# Patient Record
Sex: Female | Born: 1970
Health system: Southern US, Community
[De-identification: ages and names within clinical notes are randomized; demographics above are authoritative.]

## PROBLEM LIST (undated history)

## (undated) DIAGNOSIS — E7800 Pure hypercholesterolemia, unspecified: Secondary | ICD-10-CM

## (undated) DIAGNOSIS — E559 Vitamin D deficiency, unspecified: Secondary | ICD-10-CM

## (undated) DIAGNOSIS — I639 Cerebral infarction, unspecified: Secondary | ICD-10-CM

## (undated) DIAGNOSIS — C9590 Leukemia, unspecified not having achieved remission: Secondary | ICD-10-CM

## (undated) DIAGNOSIS — R87619 Unspecified abnormal cytological findings in specimens from cervix uteri: Secondary | ICD-10-CM

## (undated) DIAGNOSIS — I2699 Other pulmonary embolism without acute cor pulmonale: Secondary | ICD-10-CM

## (undated) DIAGNOSIS — G43909 Migraine, unspecified, not intractable, without status migrainosus: Secondary | ICD-10-CM

## (undated) DIAGNOSIS — Z86718 Personal history of other venous thrombosis and embolism: Secondary | ICD-10-CM

## (undated) DIAGNOSIS — M8580 Other specified disorders of bone density and structure, unspecified site: Secondary | ICD-10-CM

## (undated) DIAGNOSIS — N879 Dysplasia of cervix uteri, unspecified: Secondary | ICD-10-CM

## (undated) HISTORY — DX: Vitamin D deficiency, unspecified: E55.9

## (undated) HISTORY — DX: Other specified disorders of bone density and structure, unspecified site: M85.80

## (undated) HISTORY — DX: Unspecified abnormal cytological findings in specimens from cervix uteri: R87.619

## (undated) HISTORY — DX: Pure hypercholesterolemia, unspecified: E78.00

## (undated) HISTORY — DX: Migraine, unspecified, not intractable, without status migrainosus: G43.909

## (undated) HISTORY — DX: Leukemia, unspecified not having achieved remission: C95.90

## (undated) HISTORY — DX: Dysplasia of cervix uteri, unspecified: N87.9

## (undated) HISTORY — DX: Personal history of other venous thrombosis and embolism: Z86.718

## (undated) HISTORY — PX: OTHER SURGICAL HISTORY: SHX169

---
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
## 1995-09-12 HISTORY — PX: DILATION AND CURETTAGE OF UTERUS: SHX78

## 2007-03-28 ENCOUNTER — Ambulatory Visit: Payer: Self-pay | Admitting: Cardiology

## 2007-03-28 ENCOUNTER — Encounter (INDEPENDENT_AMBULATORY_CARE_PROVIDER_SITE_OTHER): Payer: Self-pay | Admitting: Obstetrics and Gynecology

## 2007-03-28 ENCOUNTER — Inpatient Hospital Stay (HOSPITAL_COMMUNITY): Admission: RE | Admit: 2007-03-28 | Discharge: 2007-03-31 | Payer: Self-pay | Admitting: Obstetrics and Gynecology

## 2007-04-02 ENCOUNTER — Encounter (INDEPENDENT_AMBULATORY_CARE_PROVIDER_SITE_OTHER): Payer: Self-pay | Admitting: Obstetrics and Gynecology

## 2007-04-02 ENCOUNTER — Ambulatory Visit: Payer: Self-pay | Admitting: Cardiology

## 2007-04-02 ENCOUNTER — Inpatient Hospital Stay (HOSPITAL_COMMUNITY): Admission: AD | Admit: 2007-04-02 | Discharge: 2007-04-05 | Payer: Self-pay | Admitting: Obstetrics and Gynecology

## 2007-04-07 ENCOUNTER — Inpatient Hospital Stay (HOSPITAL_COMMUNITY): Admission: AD | Admit: 2007-04-07 | Discharge: 2007-04-12 | Payer: Self-pay | Admitting: Obstetrics and Gynecology

## 2007-04-10 ENCOUNTER — Encounter: Admission: RE | Admit: 2007-04-10 | Discharge: 2007-04-12 | Payer: Self-pay | Admitting: Obstetrics and Gynecology

## 2007-04-16 ENCOUNTER — Ambulatory Visit: Payer: Self-pay | Admitting: Cardiology

## 2007-04-22 ENCOUNTER — Ambulatory Visit: Payer: Self-pay | Admitting: Physician Assistant

## 2007-04-29 ENCOUNTER — Ambulatory Visit: Payer: Self-pay | Admitting: Cardiology

## 2007-05-06 ENCOUNTER — Ambulatory Visit: Payer: Self-pay | Admitting: Cardiology

## 2007-05-10 ENCOUNTER — Ambulatory Visit: Payer: Self-pay | Admitting: Cardiology

## 2007-05-14 ENCOUNTER — Ambulatory Visit: Payer: Self-pay | Admitting: Cardiology

## 2007-05-28 ENCOUNTER — Ambulatory Visit: Payer: Self-pay | Admitting: Cardiology

## 2007-06-11 ENCOUNTER — Ambulatory Visit: Payer: Self-pay | Admitting: Cardiology

## 2007-06-20 ENCOUNTER — Ambulatory Visit: Payer: Self-pay | Admitting: Cardiology

## 2007-06-27 ENCOUNTER — Ambulatory Visit: Payer: Self-pay | Admitting: Cardiology

## 2007-07-11 ENCOUNTER — Ambulatory Visit: Payer: Self-pay | Admitting: Cardiology

## 2007-07-18 ENCOUNTER — Ambulatory Visit: Payer: Self-pay | Admitting: Cardiology

## 2007-08-01 ENCOUNTER — Ambulatory Visit: Payer: Self-pay | Admitting: Cardiology

## 2007-08-06 ENCOUNTER — Ambulatory Visit: Payer: Self-pay | Admitting: Cardiology

## 2007-08-14 ENCOUNTER — Ambulatory Visit: Payer: Self-pay | Admitting: Cardiology

## 2007-08-21 ENCOUNTER — Ambulatory Visit: Payer: Self-pay | Admitting: Cardiology

## 2007-09-04 ENCOUNTER — Ambulatory Visit: Payer: Self-pay | Admitting: Cardiology

## 2007-09-24 ENCOUNTER — Ambulatory Visit: Payer: Self-pay | Admitting: Cardiovascular Disease

## 2007-09-24 ENCOUNTER — Ambulatory Visit: Payer: Self-pay | Admitting: Cardiology

## 2008-03-31 ENCOUNTER — Ambulatory Visit: Payer: Self-pay | Admitting: Cardiology

## 2008-06-10 IMAGING — CT CT ABDOMEN WO/W CM
3 of 6 series · 13 of 32 positions shown, 17 images · IV contrast (30ML OMNI-MIX & [ID] OMNIP 300%)
Comparison: None
COMPARISON: None

CLINICAL DATA: Excessive bleeding. Evaluate for retroperitoneal hematoma.

ABDOMEN CT WITHOUT AND WITH CONTRAST
TECHNIQUE: Multidetector CT imaging of the abdomen was performed following the
standard protocol before and during bolus administration of intravenous
contrast.
Contrast:  100 cc Omnipaque 300
TECHNIQUE: Multidetector CT imaging of the pelvis was performed following the
standard protocol during administration of intravenous contrast.

[Series 3: abd/pelvis w cm · axial · 0.70mm/px · z∈[-400,-175]mm · 3 of 84 slices shown, 7 images]
[im 21/84  soft-tissue]
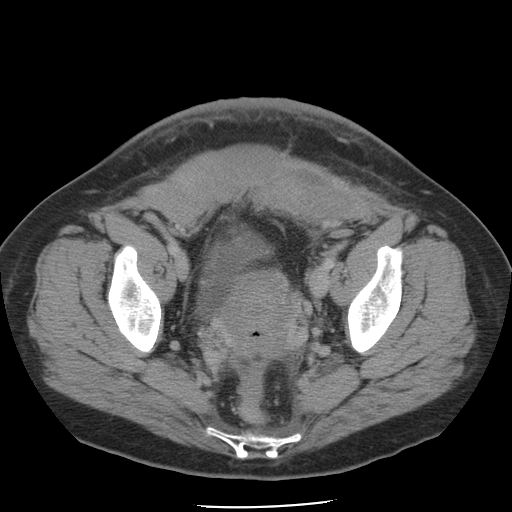
[im 21/84  lung]
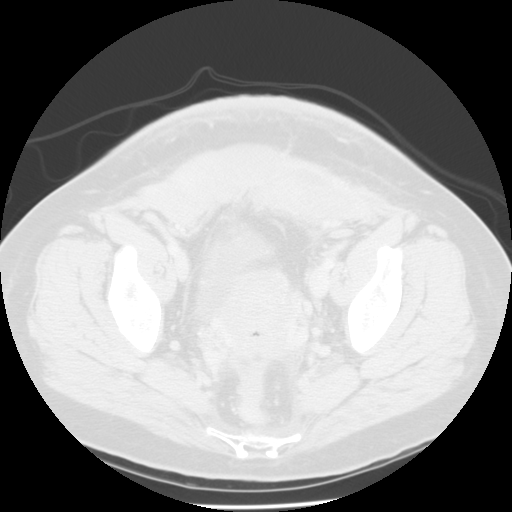
[im 21/84  bone]
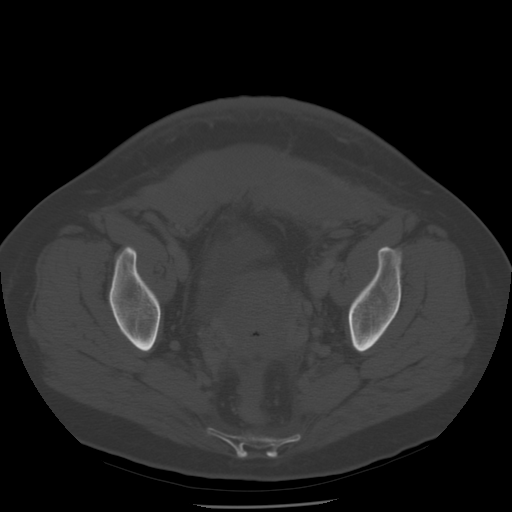
[im 42/84  soft-tissue]
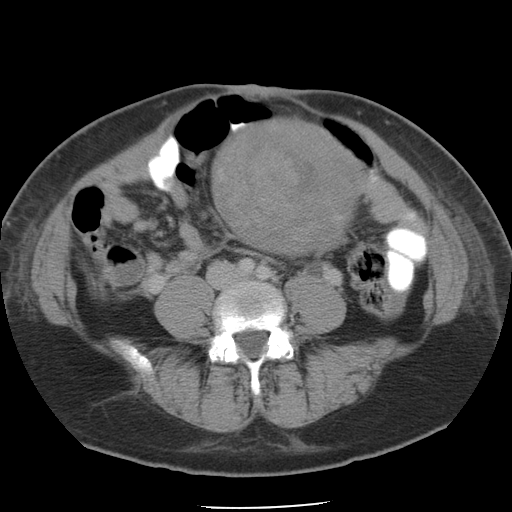
[im 42/84  lung]
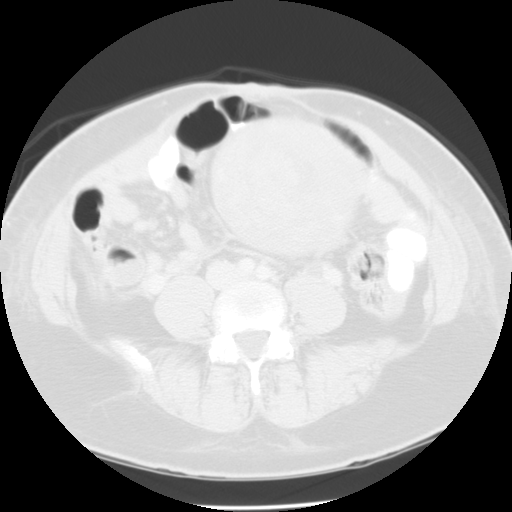
[im 63/84  soft-tissue]
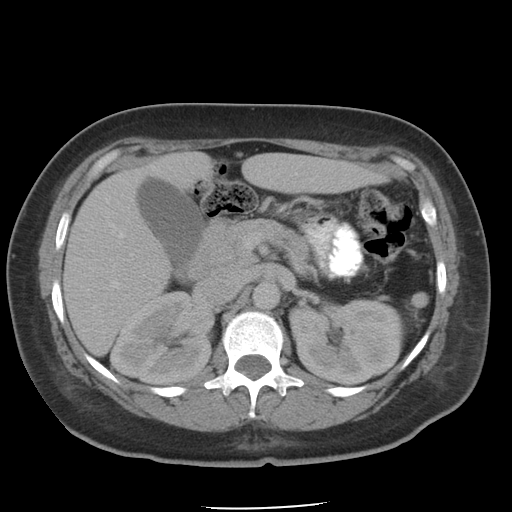
[im 63/84  lung]
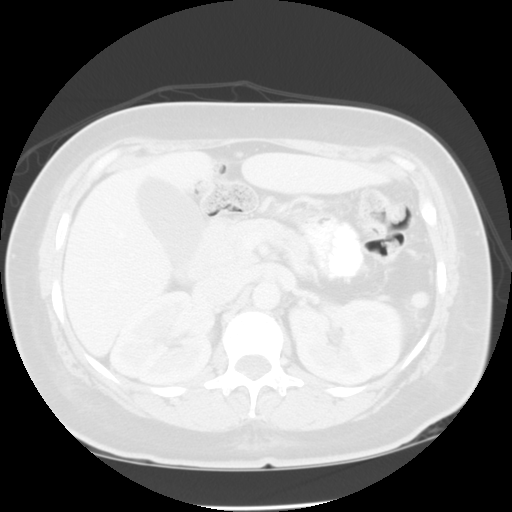

[Series 6: delays · axial · 0.70mm/px · z∈[-345,-230]mm · 2 of 69 slices shown]
[im 23/69  soft-tissue]
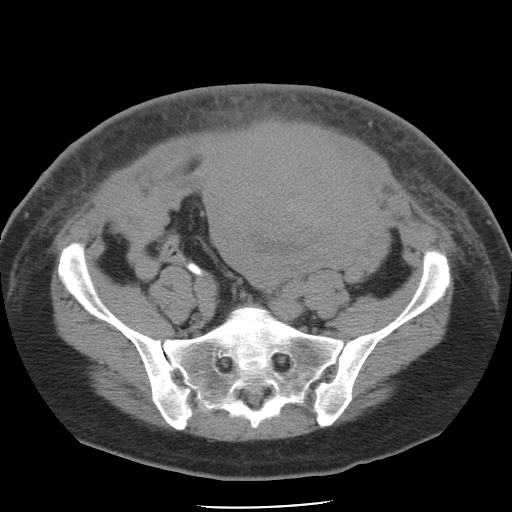
[im 46/69  soft-tissue]
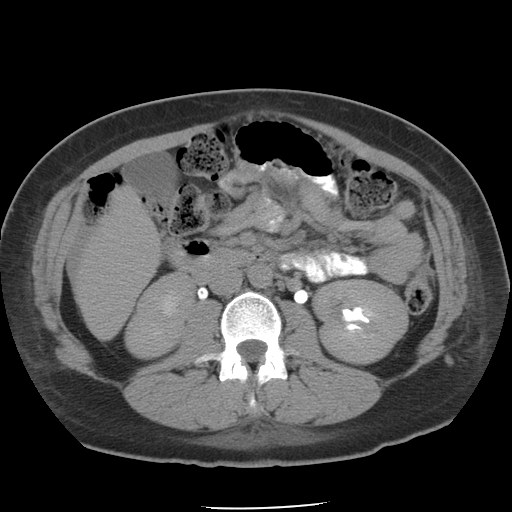

[Series 501: reformatted · sagittal · 0.84mm/px · 8 of 175 slices shown]
[im 20/175  soft-tissue]
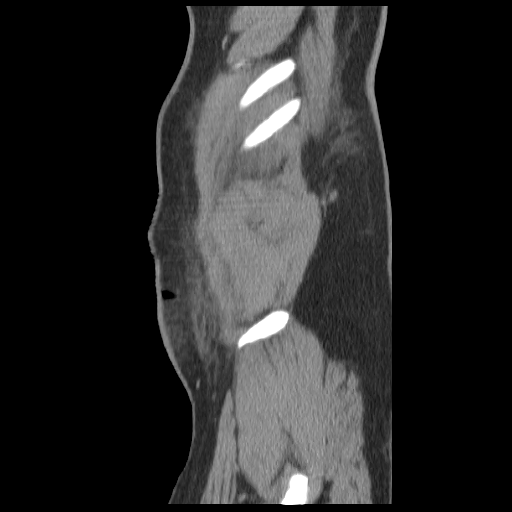
[im 39/175  soft-tissue]
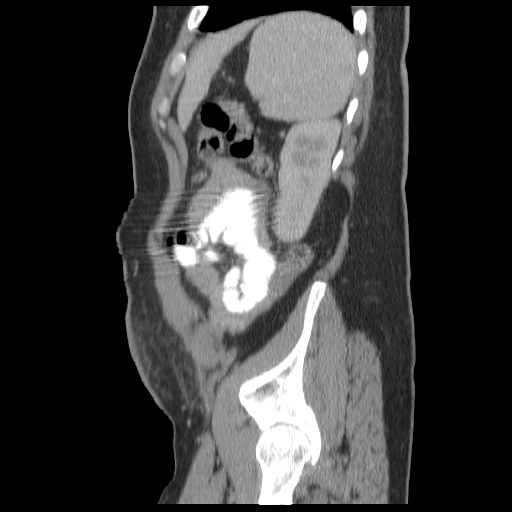
[im 59/175  soft-tissue]
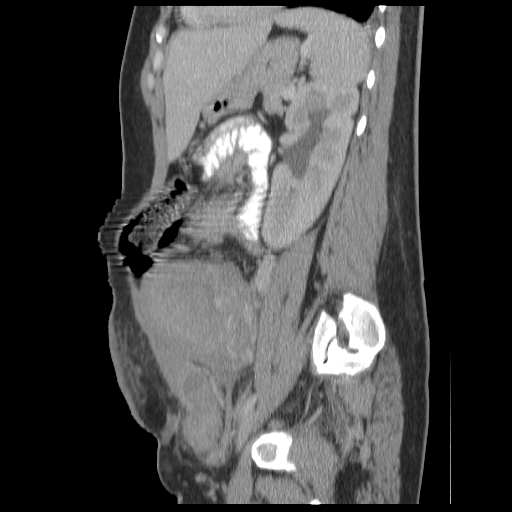
[im 78/175  soft-tissue]
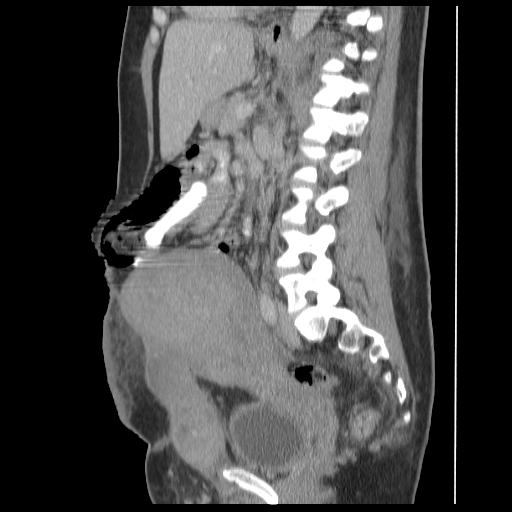
[im 97/175  soft-tissue]
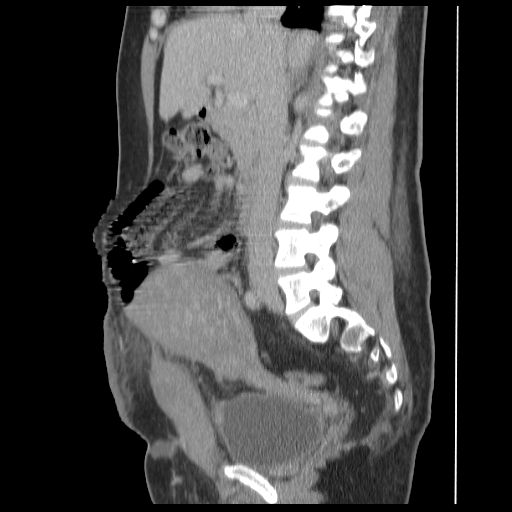
[im 117/175  soft-tissue]
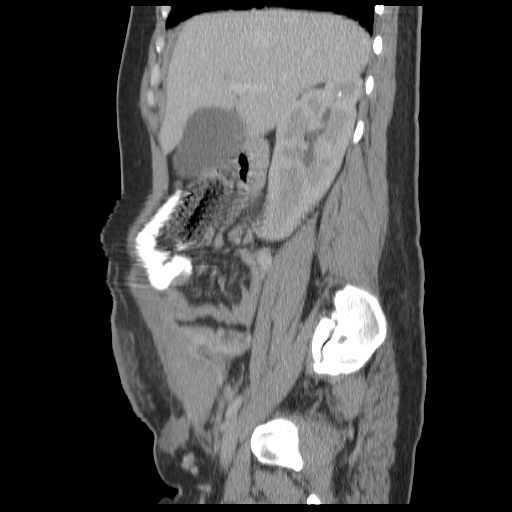
[im 136/175  soft-tissue]
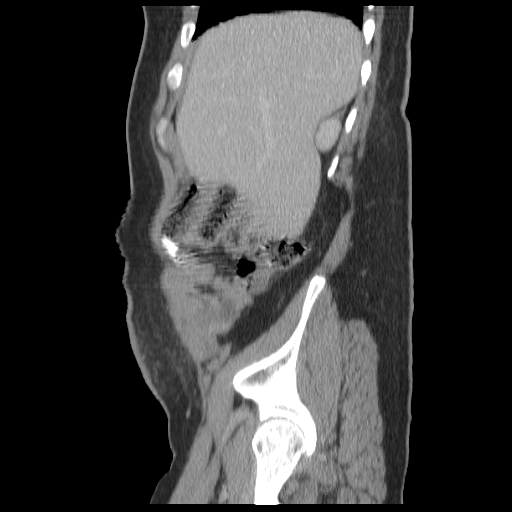
[im 155/175  soft-tissue]
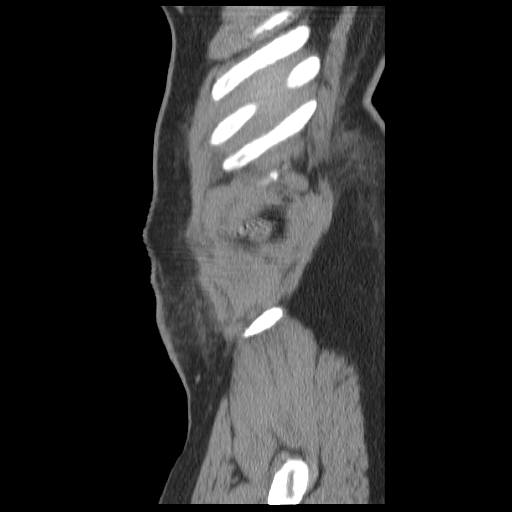

[13 of 32 positions shown; findings below may reference images not displayed]

FINDINGS: Mild subsegmental atelectasis is identified at the left base. 

There is a small amount of perihepatic fluid is noted

The liver is normal in attenuation and morphology.

Spleen negative.

Adrenal glands are negative.

Small height hernia.

There is mild to moderate bilateral hydronephrosis. 

On the delayed images there is prompt and symmetric excretion of contrast
material.

No evidence for a retroperitoneal hematoma.

Review of bone windows is unremarkable.

IMPRESSION

1. No evidence for retroperitoneal hematoma in the upper abdomen.

2. Bilateral hydronephrosis which is likely there are related to physiology of
pregnancy.

PELVIS CT WITH CONTRAST
FINDINGS: The uterus remains enlarged and distended. There is a hyperdense mass
within the uterine cavity measuring 6.8 x 7.0 cm.

The lower anterior abdominal wall/upper pelvic wall is abnormal in appearance.
The lower rectus muscle is abnormally thickened with several cystic masses
containing intermediate to high density material. The more cranial rectus mass
measures 3.1 x 5.7 cm, image 64. This contains layering high density material
which is suspicious for subacute hemorrhage. 

Within the left lower rectus muscle there is a 5.1 x 4.3 cm high density, cystic
mass.

Trace free fluid is seen within the pelvis.

Urinary bladder is negative.

Pelvic bowel loops unremarkable.

IMPRESSION

1.  Hyperdense mass within uterine cavity likely represents hematoma versus
retained products of conception.

2.  Multiple intermediate to high density fluid collections within the rectus
muscle likely represent hematomas.

3.  Examination was discussed with Dr. Zenaida Eisenhauer.

## 2009-01-25 ENCOUNTER — Encounter (INDEPENDENT_AMBULATORY_CARE_PROVIDER_SITE_OTHER): Payer: Self-pay | Admitting: *Deleted

## 2009-04-03 DIAGNOSIS — Z86718 Personal history of other venous thrombosis and embolism: Secondary | ICD-10-CM

## 2009-04-14 ENCOUNTER — Ambulatory Visit: Payer: Self-pay | Admitting: Cardiology

## 2009-04-14 DIAGNOSIS — E783 Hyperchylomicronemia: Secondary | ICD-10-CM | POA: Insufficient documentation

## 2010-08-30 ENCOUNTER — Ambulatory Visit (HOSPITAL_COMMUNITY)
Admission: RE | Admit: 2010-08-30 | Discharge: 2010-08-30 | Payer: Self-pay | Source: Home / Self Care | Attending: Internal Medicine | Admitting: Internal Medicine

## 2010-09-20 ENCOUNTER — Ambulatory Visit
Admission: RE | Admit: 2010-09-20 | Discharge: 2010-09-20 | Payer: Self-pay | Source: Home / Self Care | Attending: Cardiology | Admitting: Cardiology

## 2010-09-20 ENCOUNTER — Encounter: Payer: Self-pay | Admitting: Cardiology

## 2010-10-13 NOTE — Assessment & Plan Note (Signed)
Summary: f1y   Visit Type:  1 yr f/u Primary Provider:  Dr. Sherril Croon  CC:  pt states she had a scan on the right leg for some pain says thinks this was due to sciatica....denies any other complaints today.  History of Present Illness: Rolando times a day for evaluation and management for a post partum pulmonary embolus. Her youngest child is now 41 years old and doing well.  She's had no chest pain, dyspnea on exertion or syncope. She did have some pain in her back down her right thigh. He was treated as sciatica. She did have a venous Doppler which was negative for clot.  Her last cholesterol was 176 and ratio was up to 39. She is being more careful with her diet. She tries to walk as much as possible.  Current Medications (verified): 1)  Zyrtec Allergy 10 Mg Tabs (Cetirizine Hcl) .Marland Kitchen.. 1 Tab Once Daily 2)  Iud .... Use As Directed 3)  Ibuprofen 200 Mg Tabs (Ibuprofen) .... As Needed 4)  Zantac 150 Mg Tabs (Ranitidine Hcl) .... As Needed 5)  Aspirin 81 Mg Tbec (Aspirin) .... 2 Tabs Once Daily 6)  Fish Oil 1200 Mg Caps (Omega-3 Fatty Acids) .... As Needed  Allergies: 1)  ! Sulfa  Past History:  Past Medical History: Last updated: 04/03/2009 PULMONARY EMBOLISM, HX OF (ICD-V12.51)    Past Surgical History: Last updated: 04/03/2009 Wisdom teeth extracted C-section x2..2002 and 2008 D&C.Marland Kitchen1997  Family History: Last updated: 04/03/2009 Family History of Coronary Artery Disease: Father and grandmother  Social History: Last updated: 04/03/2009 Full Time..RN..orthopedic floor Married  Tobacco Use - No.  Alcohol Use - no Regular Exercise - yes..walk Drug Use - no  Risk Factors: Exercise: yes (04/03/2009)  Risk Factors: Smoking Status: never (04/03/2009)  Review of Systems       negative other history of present illness  Vital Signs:  Patient profile:   40 year old female Height:      64.5 inches Weight:      160.25 pounds BMI:     27.18 Pulse rate:   57 /  minute Pulse rhythm:   irregular BP sitting:   96 / 72  (left arm) Cuff size:   large  Vitals Entered By: Danielle Rankin, CMA (September 20, 2010 11:43 AM)  Physical Exam  General:  overweight, in no acute distress Head:  normocephalic and atraumatic Eyes:  PERRLA/EOM intact; conjunctiva and lids normal. Neck:  Neck supple, no JVD. No masses, thyromegaly or abnormal cervical nodes. Chest Nyoka Alcoser:  no deformities or breast masses noted Lungs:  Clear bilaterally to auscultation and percussion. Heart:  PMI nondisplaced, regular rate and rhythm, normal S1-S2, no murmur rub or gallop, no carotid bruit Abdomen:  soft, no midline bruit good bowel sounds Msk:  Back normal, normal gait. Muscle strength and tone normal. Pulses:  pulses normal in all 4 extremities Extremities:  No clubbing or cyanosis.no varicose veins or signs of DVT Neurologic:  Alert and oriented x 3. Skin:  Intact without lesions or rashes. Psych:  Normal affect.   Impression & Recommendations:  Problem # 1:  PULMONARY EMBOLISM, HX OF (ICD-V12.51) Assessment Improved  Her updated medication list for this problem includes:    Aspirin 81 Mg Tbec (Aspirin) .Marland Kitchen... 2 tabs once daily  Problem # 2:  HYPERLIPIDEMIA TYPE I / IV (ICD-272.3) Assessment: Improved  Other Orders: EKG w/ Interpretation (93000)  Patient Instructions: 1)  Your physician recommends that you schedule a follow-up appointment in: 2 years  and  as needed with Dr. Daleen Squibb 2)  Your physician recommends that you continue on your current medications as directed. Please refer to the Current Medication list given to you today.

## 2010-12-08 ENCOUNTER — Other Ambulatory Visit: Payer: Self-pay | Admitting: Obstetrics and Gynecology

## 2011-01-18 ENCOUNTER — Other Ambulatory Visit: Payer: Self-pay | Admitting: Obstetrics and Gynecology

## 2011-01-24 NOTE — Assessment & Plan Note (Signed)
[facility]                            CARDIOLOGY OFFICE NOTE   NICCOLE, WITTHUHN                    MRN:          [id number]  DATE:09/24/2007                            DOB:          February 19, 1971    Judy Walter comes back today for follow-up of her CT to rule out any further  problems with her pulmonary embolus.   She is now about 6 months out from her acute pulmonary embolus as noted  in the previous records.   A CT scan in our office today showed resolution of the right sided  pulmonary embolus without any evidence of acute cardiopulmonary disease.  She has a small hiatal hernia.   She is totally asymptomatic.  She is delighted to be coming off of  Coumadin which I think is safe at this point in time.   She is very interested in losing weight and I have spent about 20  minutes talking about diet, high glycemic indexes which she will look up  on the Internet and walking at least 3 hours per week.   Her blood pressure is excellent at 125/85.  Her pulse is 64 and regular,  weight is 162.  LUNGS:  Clear without rub.  HEART:  Reveals a regular rate and rhythm.  No gallop.  Carotids are  full without bruits.  There is no JVD.  EXTREMITIES:  No edema.  Pulses are intact.   I will see [name] back in 6 months.  Our goal is lose about 20  pounds and keep it off during that period of time.     Thomas C. Daleen Squibb, MD, [facility]  Electronically Signed    TCW/MedQ  DD: 09/24/2007  DT: 09/25/2007  Job #: [id number]   cc:   Doreen Beam, MD  Randye Lobo, M.D.

## 2011-01-24 NOTE — Op Note (Signed)
NAME:  Judy Walter, Judy Walter             ACCOUNT NO.:  [id number]   MEDICAL RECORD NO.:  [id number]          PATIENT TYPE:  INP   LOCATION:  NA                            [facility]ILITY:  [facility]   PHYSICIAN:  Randye Lobo, M.D.   DATE OF BIRTH:  1971/05/30   DATE OF PROCEDURE:  03/28/2007  DATE OF DISCHARGE:                               OPERATIVE REPORT   PREOPERATIVE DIAGNOSIS:  1. Intrauterine gestation at 38.3 weeks.  2. History of prior cesarean section, desires repeat cesarean section.  3. History of vasaprevia result by ultrasound.   POSTOPERATIVE DIAGNOSIS:  1. Intrauterine gestation at 38.3 weeks.  2. History of prior cesarean section, desires repeat cesarean section.  3. Vasaprevia present.   PROCEDURE:  Repeat low segment transverse cesarean section.   SURGEON:  Conley Simmonds, M.D.   ASSISTANT:  Lodema Hong, M.D.   ANESTHESIA:  Is spinal.   IV FLUIDS:  3000 mL Ringer's lactate.   URINE OUTPUT:  300 mL   ESTIMATED BLOOD LOSS:  700 mL   COMPLICATIONS:  None.   INDICATIONS FOR PROCEDURE:  The patient is a 40 year old a gravida 4,  para 2-0-1-2 Caucasian female at 11 plus [redacted] weeks gestation ([facility] April 08, 2007 by 8-week ultrasound) who has a history of prior cesarean section  for breech presentation and throughout this pregnancy desires a repeat  cesarean section.  Early in the pregnancy the patient was diagnosed with  a vasaprevia which has resolved by follow-up ultrasounds by [facility].  The patient does have a history of fetal macrosomia.  A  plan is now made to proceed with a repeat low segment transverse  cesarean section after risks, benefits, and alternatives are reviewed.   FINDINGS:  A viable female was delivered at 8:01 a.m. with Apgars of 9  at one minute and 9 at one minutes.  The weight was 9 pounds 9 ounces.  The amniotic fluid was clear.  The placenta did appear to have a  vasaprevia present was a marginal insertion of a three-vessel cord.  The  uterus, tubes and ovaries were unremarkable.   SPECIMENS:  The placenta was sent to pathology.   PROCEDURE:  The patient was reidentified in the preoperative hold area.  She did receive Ancef 1 gram IV for antibiotic prophylaxis.   In the operating room, spinal anesthetic was administered and the  patient was then placed in the supine position with a left lateral tilt.  The abdomen was sterilely prepped and a Foley catheter placed inside the  bladder.  She was sterilely draped.   An Allis test was performed and the patient had adequate surgical  anesthesia.  A Pfannenstiel incision was created sharply with a scalpel.  Incision was carried down to the fascia using a combination of sharp  dissection and monopolar cautery for hemostasis.   The fascia was incised in the midline and the fascial incision was  extended bilaterally with the Mayo scissors.  The rectus muscles were  dissected off of the fascia superiorly and inferiorly.  The rectus  muscles were then sharply divided in the midline.  Two hemostat clamps  were used to elevate the parietal peritoneum which was entered sharply.  The incision was extended cranially and caudally.   A bladder flap was created and the bladder retractor was placed over the  bladder.  A transverse lower uterine segment incision was then created  sharply with a scalpel.  Membranes were ruptured with an Allis clamp.  The uterine incision was extended bilaterally in an upward fashion  bluntly.  A hand was inserted through the uterine incision.  The vertex  was delivered without difficulty.  The nares and mouth were suctioned.  A nuchal cord was reduced.  The remainder of the newborn was delivered.  The cord was doubly clamped and cut and the newborn was carried over to  the waiting pediatricians in vigorous condition.   The placenta was manually extracted and the vasa previa was noted and  the placenta was sent to pathology.  The uterus was exteriorized  at this  time and it was wiped clean with a moistened lap pad.  There were no  remaining products of conception noted.  The uterine incision was closed  with a double layer closure of #1 chromic.  The first was a running  locked layer and the second was an imbricating layer.  There was a small  amount of bleeding noted along the right apex of the uterine incision  and this responded to a figure-of-eight suture of #1 chromic.   The uterus was returned to the peritoneal cavity at this time which was  irrigated and suctioned.  The uterine incision was hemostatic.  The  abdomen was therefore closed.  The parietal peritoneum was closed with a  running suture of 3-0 Vicryl.  The rectus muscles were approximated with  interrupted sutures of 0 Vicryl.  The fascia was closed with a running  suture of 0 Vicryl.  The subcutaneous tissue was irrigated and suctioned  and made hemostatic with monopolar cautery.  The skin was closed with  staples and a sterile bandage was placed over this.   This concluded the patient's procedure.  There were no complications.  All needle, instrument, sponge counts were correct.      Randye Lobo, M.D.  Electronically Signed     BES/MEDQ  D:  03/28/2007  T:  03/28/2007  Job:  [id number]

## 2011-01-24 NOTE — Assessment & Plan Note (Signed)
[facility]                            CARDIOLOGY OFFICE NOTE   Judy Walter, Judy Walter                    MRN:          [id number]  DATE:08/06/2007                            DOB:          1971/05/06    HISTORY:  Judy Walter returns today for further management of her  pulmonary embolus.  This was a post-partum pulmonary embolus diagnosed  April 01, 2007.  She has finally achieved therapeutic INR with very  significant doses of Coumadin.  She takes 12.5 on Mondays, 10 on Tuesday  through Sunday.   She is having no chest pain, shortness of breath, hemoptysis, tachy  palpitations.  She has had no lower extremity edema.  She brings her  beautiful child today.   MEDICATIONS:  1. She is on iron sulfate.  2. Zyrtec 10 mg a day.  3. Zantac 75 mg p.o. daily.  4. Fish oil.  5. Omega 3.  6. Prenatal Elite vitamins.   PHYSICAL EXAMINATION:  VITAL SIGNS:  Blood pressure 114/89, pulse 59 and  regular, weight 163.  HEENT:  Normocephalic, atraumatic.  PERRLA.  Extraocular movements are  intact.  Sclerae are clear.  Face asymmetry is normal.  NECK:  Carotid upstrokes are equal bilaterally without bruits.  No JVD.  Thyroid is not enlarged.  Trachea is midline.  LUNGS:  Clear.  HEART:  Reveals a regular rate and rhythm.  Normal S1 and S2.  ABDOMEN:  Soft, good bowel sounds.  EXTREMITIES:  No clubbing, cyanosis, or edema.  There is no sign of DVT.   DIAGNOSTICS:  EKG shows sinus brady with slight rightward axis which is  stable.  She has some  ST segment flattening inferiorly.   ASSESSMENT/PLAN:  Judy Walter is doing well.  We will set up a spiral  CT with contrast in our office at the end of January.  If this is  negative for any pulmonary emboli, we will discontinue Coumadin at that  time.     Thomas C. Daleen Squibb, MD, [facility]  Electronically Signed    TCW/MedQ  DD: 08/06/2007  DT: 08/06/2007  Job #: [id number]   cc:   Randye Lobo, M.D.  Dhruv Sherril Croon

## 2011-01-24 NOTE — Assessment & Plan Note (Signed)
[facility]                            CARDIOLOGY OFFICE NOTE   VERBA, AINLEY                    MRN:          [id number]  DATE:05/10/2007                            DOB:          1971/03/05    [name] returns today for follow-up and management of her  pulmonary embolus.   After delivery via C-section she became bradycardic and short of breath.  She was found to have pulmonary emboli by CT scan.  She was  anticoagulated and ultimately discharged home.  Unfortunately, she  developed some bleeding subcutaneously and developed a significant intra-  abdominal wall hematoma.   She had to be readmitted to watch carefully.   She is having her INRs checked in [place].  She says it is taking a lot of  Coumadin to keep her anticoagulated.  Her last one was 1.8 and they  bumped her up.  She is currently taking 12.5 mg on Monday, 10 mg the  rest of the week.   Her other medications are prenatal vitamin, fish oil, omega-3, Zantac 75  mg a day, Zyrtec 10 mg a day, ferrous sulfate 325 mg a day.   She denies any shortness of breath, cough, hemoptysis, fever, chills,  sweats, melena or hematochezia.   Her blood pressure is 110/80, her pulse 53 and regular.  Her weight is  163.  She is in no acute distress.  Skin is warm and dry.  HEENT:  Unremarkable.  Carotid upstrokes are equal bilaterally without bruits.  No JVD.  Thyroid is not enlarged.  Trachea is midline.  LUNGS:  Clear without rubs or decreased breath sounds.  HEART:  A normal S1-S2 without gallop.  ABDOMEN:  Soft with good bowel sounds.  Her hematoma is largely  resolved.  There is no tenderness.  EXTREMITIES:  No edema.  Pulses are intact.  There is no sign of DVT.  NEUROLOGIC:  Intact.   2 D echocardiogram in the hospital was normal.   She asked me today if she can take the mini-pill, which is progesterone  only, for contraception.  She is also curious about IUDs, which she says  she thinks has a touch of estrogen.   I discussed it with Bevelyn Buckles. Bensimhon, MD, and Dr. Jimmey Ralph.  We feel  like the progesterone-only as a mini pill is reasonable.  I have asked  her to ask Dr. Edward Jolly about how much estrogen is in the IUDs and are they  systemically absorbed.  If they are, we would not recommend this.   I will see her back in 3 months.  She will continue to have her Coumadin  checked in [place].  We will probably continue her Coumadin for a total of  6 months, re-scan her, and if everything is clear probably discontinue  Coumadin at that time.     Thomas C. Daleen Squibb, MD, [facility]  Electronically Signed    TCW/MedQ  DD: 05/10/2007  DT: 05/12/2007  Job #: [id number]   cc:   Randye Lobo, M.D.  Dhruv Sherril Croon

## 2011-01-24 NOTE — Assessment & Plan Note (Signed)
[facility]                                 ON-CALL NOTE   AARIN, SPARKMAN                      MRN:          [id number]  DATE:04/06/2007                            DOB:          November 16, 1970    CARDIOLOGIST:  Jesse Sans. Wall, MD, [facility].   PHONE NUMBER:  [phone].   HISTORY:  [name] is a 40 year old female post-partum patient who  recently delivered via C-section and reported to the hospital with  bradycardia and shortness of breath.  She was found to have pulmonary  emboli and was placed on Lovenox and Coumadin.  She was discharged to  home today.  She is a Engineer, civil (consulting) at [employer].  She checked her heart rate  with her stethoscope earlier today and noted that her heart rate was 43.  She denied any symptoms of lightheadedness, syncope, near-syncope or  increasing shortness of breath.   PLAN:  I explained to Ms. Cordone that her heart rate of 43 is  probably okay.  She seems to be fairly asymptomatic.  If she develops  any symptoms or changes in her breathing, like she did when she  presented with her pulmonary emboli, she should go to the emergency  room.  Otherwise, I will leave a message at the office for Dr. Daleen Squibb or  his nurse.  I told the patient we could consider a holter monitor.  But,  I will leave this up to Dr. Daleen Squibb who knows the patient well.   DISPOSITION:  As noted above.      Tereso Newcomer, PA-C  Electronically Signed      Noralyn Pick. Eden Emms, MD, [facility]  Electronically Signed   SW/MedQ  DD: 04/06/2007  DT: 04/07/2007  Job #: [id number]

## 2011-01-24 NOTE — Discharge Summary (Signed)
NAME:  Judy Walter, Judy Walter             ACCOUNT NO.:  [id number]   MEDICAL RECORD NO.:  [id number]          PATIENT TYPE:  INP   LOCATION:  9306                          FACILITY:  [facility]   PHYSICIAN:  Randye Lobo, M.D.   DATE OF BIRTH:  09/24/1970   DATE OF ADMISSION:  04/07/2007  DATE OF DISCHARGE:  04/12/2007                               DISCHARGE SUMMARY   FINAL DIAGNOSIS:  Bilateral pulmonary emboli, abdominal wall hematomas,  anemia.   COMPLICATIONS:  None.   This 40 year old G4, P3-0-1-3 presents on postoperative day number 11  from a Cesarean Section, complaining of painless vaginal bleeding.  The  patient was also be readmitted earlier on the 22nd with some shortness  of breath and sinus bradycardia.  The patient was diagnosed with right  pulmonary emboli.  The patient was on IV heparin on conversion to  Lovenox and Coumadin.  She was discharged on the 25th. The patient's  antepartum course had been complicated by a vasa previa of the placenta.  The patient also had a history of childhood leukemia, but no other  complications during her antepartum course were noted.   Upon admission, the patient's uterus was mildly tender.  Her incision  looked nice and clean and dry.  She was having some vaginal bleeding  with clots removed.  Her uterus was about 16 weeks' size and tender.  An  ultrasound was performed which did show a 9-cm anterior pelvic hematoma.  The patient was admitted.  Labs were obtained.  The patient's coumadin  was in a subtherapeutic range, however, it was felt that her Lovenox  dosage was above the desired therapeutic dosage, and this was attributed  due to rapid changes in her body mass index following delivery.  Lovenox  was discontinued, and IV heparin was begun while waiting for the  coumadin to reach a therapeutic level.  The patient's vaginal bleeding  was stable during this hospital course.  Retained products of conception  was low on the differential  diagnosis, and therefore a dilation and  curettage was not performed, watching hemoglobin at this time. A CT scan  of the abdomen and pelvis on the 28th did show bilateral hydronephrosis  and no evidence of intraperitoneal hematoma, but there were several  small abdominal wall hematomas consistent with subacute hemorrhage.  The  patient was started on IV cefotetan for prophylaxis during this time.  Cardiology continued to see the patient throughout this hospital course.  The patient's INRs and coag panels were kept close watch on.  The  patient's bleeding started to minimize, and her hemoglobin started to  steady off.  She was noted to have a macrocytic anemia, so B12 and  folate were checked as well.  The patient was feeling much better.  Hemoglobin was stable.  The patient was on heparin and Coumadin at this  time.  By August 1, the patient was doing well.  The baby was being  discharged that day, and she was ready to go home as well.  She was  stable.  She was to continue her Coumadin per cardiology, was sent  home  with some Lortab 5/500, one to two every 4 to 6 hours as needed for the  pain.  She was to avoid any ibuprofen products.  She had a follow-up  appointment with her cardiologist on August 5th and was to follow up in  our office in 2 weeks for an ultrasound and post-op check.  She was of  course to call with any increased bleeding, pain or fevers.   LABS ON DISCHARGE:  The patient had a hemoglobin that was stable at 9.5.  This was down from a admission level of 12.6.  She also had a white  blood cell count of 8.5, platelets of 361,000, a PT of 24.4 and an INR  of 2.1 on discharge.      Leilani Able, P.A.-C.      Randye Lobo, M.D.  Electronically Signed    MB/MEDQ  D:  05/13/2007  T:  05/13/2007  Job:  [id number]

## 2011-01-24 NOTE — Assessment & Plan Note (Signed)
[facility]                            CARDIOLOGY OFFICE NOTE   Judy, Walter                    MRN:          [id number]  DATE:03/31/2008                            DOB:          [birth year]    Judy Walter comes in today for further management and followup of her history  of pulmonary embolus.  Please see my note from September 24, 2007.   She is having no shortness of breath and no chest pains.  Last CT showed  resolution of her right-sided pulmonary embolus.   She has lost 11 pounds of the 20 we had talked about on last visit.  Her  blood pressure is down to 98/62.  Her heart rate is 58 and regular.  She  said she feels well.  Her goal was to lose about 10 more.   She shared a lipid panel that she had with me verbally.  Her total  cholesterol was about 170.  She said that HDLs are in the 20s and LDLs  over 100, much of this is genetic.   CURRENT MEDS:  1. Zyrtec 10 mg a day.  2. Birth control pill daily.  3. Multivitamin daily.   PHYSICAL EXAMINATION:  GENERAL:  She is in no acute distress.  Weight is  down to 151.  VITALS:  98/62, pulse is 58 and regular.  HEENT:  Unchanged.  Carotid upstrokes were equal bilaterally without  bruits.  No JVD.  Thyroid is not enlarged.  Trachea is midline.  LUNGS:  Clear.  HEART:  Reveals a nondisplaced PMI.  Normal S1 and S2.  ABDOMEN:  Soft.  Good bowel sounds.  EXTREMITIES:  No edema.  Pulses are intact.  NEURO:  Intact.   [name] is doing well and I am very proud of her losing the weight and I  have encouraged her to lose hopefully 5-10 more.  I have explained to  her that her total cholesterol and LDL may drop further with diet, but  her HDL probably will not change a lot.   At this point, I have made no changes in her program.  We will plan on  seeing her back in a year.    Thomas C. Daleen Squibb, MD, [facility]  Electronically Signed   TCW/MedQ  DD: 03/31/2008  DT: 04/01/2008  Job #: [id number]   cc:   Randye Lobo, M.D.

## 2011-01-24 NOTE — Discharge Summary (Signed)
Judy Walter, Judy Walter             ACCOUNT NO.:  [id number]   MEDICAL RECORD NO.:  [id number]          PATIENT TYPE:  INP   LOCATION:  9128                          FACILITY:  [facility]   PHYSICIAN:  Carrington Clamp, M.D. DATE OF BIRTH:  10-15-1970   DATE OF ADMISSION:  03/28/2007  DATE OF DISCHARGE:  03/31/2007                               DISCHARGE SUMMARY   FINAL DIAGNOSES:  Intrauterine pregnancy at 38-3/7 weeks' gestation,  history of prior cesarean section.  The patient desires repeat cesarean  section, history of vaso previa  present.   PROCEDURE:  Repeat low transverse cesarean section.  Surgeon Dr. Conley Simmonds, assistant Dr. Lodema Hong.   COMPLICATIONS:  None.   This 40 year old G4, P2-0-1-2 presents at 38-3/7 weeks' gestation for a  repeat cesarean section.  The patient had a history of prior cesarean  section with her first pregnancy secondary to breech presentation and  desires repeat during this pregnancy as well.  Early in the patient's  pregnancy she was also diagnosed with vaso previa which has been  followed up by multiple ultrasounds at [facility].  Otherwise, the  patient's antepartum course up to this point is complicated by advanced  maternal age.  She did have a normal first trimester screen and AST and  declined amniocentesis.  The patient also had a history of childhood  leukemia.  At this point she is taken to the operating room.   HOSPITAL COURSE:  At this point the patient is taken to the operating  room on March 28, 2007 by Dr. Conley Simmonds where a repeat low segment  transverse cesarean section was performed with the delivery of a 9 pound  9 ounce female infant with Apgars of 9 and 9.  Placenta did appear to  have vaso previa present with a marginal insertion of a three-vessel  cord. Delivery went without complications.  The patient's postoperative  course was benign without any significant fevers.  She did have some  mild postoperative anemia and was  started on iron.  Was felt ready for  discharge on postoperative day #3.  She was sent home on a regular diet,  told to decrease her activity, told to continue her prenatal vitamins  and her iron supplement.  Was given Percocet 1-2 every 4-6 hours as  needed for pain.  Was to follow up in our office in four weeks.  The  patient was also told she could use Motrin up to 600 mg every six hours  as needed for pain.  Precautions and instructions were reviewed with the  patient, and she was told to follow up in our office on August 15 for a  postpartum check.   LABORATORIES ON DISCHARGE:  She had a hemoglobin 9.7 which is down from  a preoperative level of 11.4.  She had white blood cell count of 10.1  and platelets of 185,000.     Leilani Able, P.A.-C.      Carrington Clamp, M.D.  Electronically Signed   MB/MEDQ  D:  05/10/2007  T:  05/11/2007  Job:  [id number]

## 2011-01-24 NOTE — Discharge Summary (Signed)
NAME:  Judy Walter, Judy Walter             ACCOUNT NO.:  [id number]   MEDICAL RECORD NO.:  [id number]          PATIENT TYPE:  INP   LOCATION:  9374                          [facility]ILITY:  [facility]   PHYSICIAN:  Randye Lobo, M.D.   DATE OF BIRTH:  1970-10-22   DATE OF ADMISSION:  04/02/2007  DATE OF DISCHARGE:  04/05/2007                               DISCHARGE SUMMARY   FINAL DIAGNOSES:  1. Status post cesarean section, bilateral tubal ligation.      postoperative day #5.  2. Shortness of breath.  3. Pulmonary embolism.   CONSULTATIONS OBTAINED:  Cardiology.   COMPLICATIONS:  None.   This 40 year old female who was status post cesarean section and tubal  ligation 5 days prior presents with history of shortness of breath and  pressure in her chest.  The patient's postoperative course to this point  had been uneventful.  In the ER upon admission, the patient was noted to  be bradycardic and have some hypotension as well.  She was having 2-3+  edema in her lower extremities to her knees.  No evidence of DVT was  noted.  The patient was admitted.  An EKG did confirm bradycardia, and  cardiac consult was obtained immediately.  Labs for preeclampsia were  benign.  She did have slightly elevated liver function tests, but no  other abnormalities on her labs.  Cardiology saw the patient on April 02, 2007 and ordered a CT scan of the chest as well as a 2D echocardiogram.  The diagnosis of pulmonary blood was made, and the patient was  immediately started on anticoagulants.  The patient was started on  heparin, and Coumadin was added per protocol.  Cardiology continued to  see the patient throughout her hospital course.  The patient was feeling  much better.  The patient was counseled by pharmacy about using  anticoagulants at home.  The patient was felt ready for discharge on  hospital day #4.  Baby is still in the NICU with some episodes of  bradycardia as well.  The patient had anticoagulant panel  checked.  Everything was negative, except for protein C and S were still pending.  The patient was sent home on her Lovenox 90 mg subcutaneously q.12 h.,  and her Coumadin and 7.5 mg to take daily.  She was to follow up with  the cardiologist on April 08, 2007 and with her OB on the 17th for her  postop check, but of course to call with any increase in her symptoms,  any fevers, bleeding, or pain.   LABORATORIES ON DISCHARGE:  The patient had a hemoglobin of 12.1, white  blood cells 6.3, platelets of 225,000.  She had a PT of 16.1, and an INR  of 1.3 upon discharge.  It looked like her coagulation panel was  negative.      Leilani Able, P.A.-C.      Randye Lobo, M.D.  Electronically Signed    MB/MEDQ  D:  05/17/2007  T:  05/17/2007  Job:  [id number]

## 2011-01-24 NOTE — Consult Note (Signed)
Judy Walter, Judy Walter             ACCOUNT NO.:  [id number]   MEDICAL RECORD NO.:  [id number]          PATIENT TYPE:  INP   LOCATION:  9374                          [facility]ACILITY:  [facility]   PHYSICIAN:  Jonelle Sidle, MD DATE OF BIRTH:  05-Feb-1971   DATE OF CONSULTATION:  04/02/2007  DATE OF DISCHARGE:                                 CONSULTATION   REASON FOR CONSULTATION:  Shortness of breath and bradycardia.   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  She had a uncomplicated  postoperative course and just recently went home.  My understanding is  that she has had a fair amount of lower extremity edema during her  pregnancy, and some baseline shortness of breath, but she did not report  any marked changes in this or progression.  She states that yesterday  she experienced nausea intermittently and later in the evening, an  uncomfortable feeling in her chest.  She went to bed and this morning  when she awoke, continued to feel a feeling of discomfort in her chest,  described as a heaviness and also generally shortness of breath.  She  presented for further evaluation and was noted to be significantly  bradycardic with heart rates in the 30s to 40s, in sinus rhythm, based  on available telemetry strips.  She was treated with Robinul by  anesthesia and did have a heart rate increase up into the 60s and 70s  with some improvement in her symptoms.  She was noted concurrently to be  relatively hypertensive with blood pressures in the 160-170 range  systolic over diastolics in the 90-100 range.  Her chest x-ray was  reported verbally to show increase interstitial markings.  She is now  admitted for further evaluation.   In speaking with Mrs. Holtry, she denies any prior history of  cardiovascular disease or dysrhythmia.  Her electrocardiogram  from today  shows significant sinus bradycardia at 37 beats per minute.  There are  no acute ST-T wave changes to suggest myocardial injury or ischemia.  Initial cardiac markers show a normal total CK of 66, normal CK-MB of  2.8 and a normal troponin-I of 0.03.  I do note increased AST and ALT of  80 and 88 respectively.  Normal total bilirubin of 0.6.  Otherwise her  renal function is normal and her hemoglobin is 10.9.  A CT scan of the  chest has been ordered and is pending at this time.  Presently the  patient is fairly comfortable on the intensive care unit with a heart  rate in the 40s and stable systolic blood pressure in the 130s to 140s.  She feels weak.   ALLERGIES:  SULFA DRUGS.  The patient also states that she developed a  rash on her chest after receiving ROBINUL.   MEDICATIONS AT DISCHARGE:  1. Zantac 75 mg p.o. b.i.d.  2. Zyrtec 10 mg p.o. daily.  3. DHA.  4. Iron supplements.  5. Percocet  p.r.n.  6. She is on no AV nodal blocking drugs.   PAST MEDICAL HISTORY:  As outlined above.   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  No active tobacco use or alcohol use.   FAMILY HISTORY:  Hypertension but no clear premature cardiovascular  disease.   REVIEW OF SYSTEMS:  As per HPI.  The patient states she has been  recovering normally since discharge.  No obvious fevers or chills noted.  Appetite has been slowly progressing.  She has had no frank emesis.  Some constipation recently.  Lower extremity edema continues and seems  to be worse when she stands most of the day.  No frank orthopnea, PND,  palpitations, or syncope.   PHYSICAL EXAMINATION:  VITAL SIGNS: Most recent blood pressure 139/92.  Heart rate 40-45 in sinus rhythm.  Oxygen saturation in the high 90s.  The patient is afebrile.  GENERAL:  This is an overweight woman in no acute distress.  HEENT: Conjunctivae and lids are normal.  Oropharynx is clear.  NECK:  Supple.  No  elevated jugular venous pressure.  No bruits.  No  thyromegaly or thyroid tenderness.  LUNGS:  Clear with somewhat diminished breath sounds.  No active  wheezing or frank crackles.  CARDIAC:  Regular rate and rhythm.  No obvious S3 gallop or loud  systolic murmur.  No pericardia rub.  ABDOMEN:  Nontender.  Bowel sounds present.  Incision healing.  No  guarding.  EXTREMITIES:  1+ edema below the knees and symmetric.  No obvious  erythema.  Distal pulses are 2+.  SKIN:  Warm and dry.  MUSCULOSKELETAL: No kyphosis is noted.  NEUROPSYCHIATRIC:  The patient alert and oriented x3.  Affect is normal.   LABORATORY DATA:  WBC 7.6, hemoglobin 10.9, hematocrit 31.9, platelets  283.  Sodium 138, potassium 3.8, chloride 107, bicarb 27, glucose 91,  BUN 18, creatinine 0.57, AST 80, ALT 88.  Total bilirubin 0.6.  Total CK  66, CK-MB 2.8, troponin-I 0.03.  BNP mildly elevated at 249.  Urine  essentially normal.   IMPRESSION:  1. Recent onset dyspnea and chest discomfort in a 40 year old woman,      status post recent cesarean section.  She is noted to be      bradycardic in sinus rhythm, not hypoxic, and hypertensive.  She      received Robinul per anesthesia with an increase in heart rate and      some improvement in symptoms although heart rate has drifted back      down over time.  Her electrocardiogram does not show acute ST-T      wave changes to suggest frank injury current or ischemia.  Initial      cardiac markers are reassuring.  BNP is noted to be mildly      increased.  She has no antecedent history of cardiovascular disease      or dysrhythmia.  2. Reported history of childhood leukemia.  3. Increased AST and ALT, significance not clear at this time.   RECOMMENDATIONS:  I discussed this situation with the patient and her  husband.  I note that a CT scan of the chest has been ordered to exclude  thromboembolic disease which is certainly reasonable.  She is not  hypoxic or tachycardic.   However, there have been rare instances of  pulmonary emboli associated with relative bradycardia.  Cardiac markers  are also ordered with the initial set being fairly  reassuring.  We will  continue to follow these as well as telemetry and serial  electrocardiograms.  I have added a 2-D echocardiogram to her orders for  further assessment  of cardiac structure and function.  Can consider in the differential  cardiomyopathy and possibly even spontaneous coronary dissection  although these are fairly unusual and she is not manifesting active  symptoms of myocardial injury at this point based on objective  information.  We will also check a TSH.  Further plans to follow.      Jonelle Sidle, MD  Electronically Signed     SGM/MEDQ  D:  04/02/2007  T:  04/02/2007  Job:  [id number]   cc:   Malva Limes, M.D.  Fax: [phone]

## 2011-01-24 NOTE — Consult Note (Signed)
NAME:  Judy Walter, Judy Walter             ACCOUNT NO.:  [id number]   MEDICAL RECORD NO.:  [id number]          PATIENT TYPE:  INP   LOCATION:  9306                          [facility]ILITY[facility]   PHYSICIAN:  Darryl D. Prime, MD    DATE OF BIRTH:  [birth year]   DATE OF CONSULTATION:  DATE OF DISCHARGE:  04/12/2007                                 CONSULTATION   TYPE OF CONSULT:  Cardiology call note.   CARDIOLOGIST:  Jesse Sans. Wall, MD, FACC   SUMMARY:  The patient called in tonight at approximately 12:30 a.m.  August 14, 2007, because she thinks she may have doubled up on her  Coumadin over the last 3 days.  She had it refilled recently. Instead of  the 5 mg tablets, which she takes daily they refilled it to 10 mg  tablets and she has been 10 mg for the last 3 days, and she is  concerned.  The patient notes having some vaginal spotting which was  very mild over the weekend, otherwise she notes no bleeding.  She notes  2 weeks ago her INR was 4.0.  She is on Coumadin for pulmonary embolus  right lung. I reassured her that everything should be okay and she is to  check her INR at daybreak at [facility] clinic in [place].      Darryl D. Prime, MD  Electronically Signed     DDP/MEDQ  D:  08/14/2007  T:  08/14/2007  Job:  [id number]

## 2011-03-21 ENCOUNTER — Encounter: Payer: Self-pay | Admitting: Cardiology

## 2011-06-26 LAB — URIC ACID
Uric Acid, Serum: 4.6
Uric Acid, Serum: 5.5
Uric Acid, Serum: 5.8

## 2011-06-26 LAB — URINALYSIS, ROUTINE W REFLEX MICROSCOPIC
Bilirubin Urine: NEGATIVE
Ketones, ur: NEGATIVE
Ketones, ur: NEGATIVE
Leukocytes, UA: NEGATIVE
Nitrite: NEGATIVE
Protein, ur: NEGATIVE
Protein, ur: NEGATIVE
Urobilinogen, UA: 0.2
pH: 7

## 2011-06-26 LAB — COMPREHENSIVE METABOLIC PANEL
ALT: 32
ALT: 56 — ABNORMAL HIGH
ALT: 80 — ABNORMAL HIGH
AST: 53 — ABNORMAL HIGH
Albumin: 2.3 — ABNORMAL LOW
Albumin: 2.3 — ABNORMAL LOW
Albumin: 2.3 — ABNORMAL LOW
Albumin: 2.5 — ABNORMAL LOW
Alkaline Phosphatase: 86
Alkaline Phosphatase: 98
BUN: 16
BUN: 18
BUN: 8
BUN: 9
CO2: 25
CO2: 27
Calcium: 8.6
Calcium: 8.7
Calcium: 9.3
Chloride: 106
Chloride: 107
Creatinine, Ser: 0.51
Creatinine, Ser: 0.57
GFR calc Af Amer: >60
GFR calc Af Amer: >60
GFR calc non Af Amer: >60
GFR calc non Af Amer: >60
Glucose, Bld: 107 — ABNORMAL HIGH
Glucose, Bld: 114 — ABNORMAL HIGH
Glucose, Bld: 98
Potassium: 3.4 — ABNORMAL LOW
Potassium: 3.4 — ABNORMAL LOW
Potassium: 3.8
Sodium: 138
Sodium: 140
Sodium: 140
Sodium: 141
Total Bilirubin: 0.5
Total Protein: 4.7 — ABNORMAL LOW
Total Protein: 5.3 — ABNORMAL LOW
Total Protein: 5.6 — ABNORMAL LOW
Total Protein: 5.8 — ABNORMAL LOW

## 2011-06-26 LAB — CBC
HCT: 27.5 — ABNORMAL LOW
HCT: 27.7 — ABNORMAL LOW
HCT: 27.9 — ABNORMAL LOW
HCT: 29.1 — ABNORMAL LOW
HCT: 31 — ABNORMAL LOW
HCT: 31.6 — ABNORMAL LOW
HCT: 31.9 — ABNORMAL LOW
HCT: 35.4 — ABNORMAL LOW
HCT: 37.6
Hemoglobin: 10.1 — ABNORMAL LOW
Hemoglobin: 10.9 — ABNORMAL LOW
Hemoglobin: 11 — ABNORMAL LOW
Hemoglobin: 11.3 — ABNORMAL LOW
Hemoglobin: 11.4 — ABNORMAL LOW
Hemoglobin: 12.1
Hemoglobin: 12.6
Hemoglobin: 9.5 — ABNORMAL LOW
Hemoglobin: 9.6 — ABNORMAL LOW
MCHC: 33.5
MCHC: 34.2
MCHC: 34.3
MCHC: 34.6
MCHC: 34.7
MCV: 100.6 — ABNORMAL HIGH
MCV: 101.6 — ABNORMAL HIGH
MCV: 101.8 — ABNORMAL HIGH
MCV: 102 — ABNORMAL HIGH
Platelets: 185
Platelets: 319
Platelets: 325
Platelets: 349
Platelets: 361
Platelets: 374
Platelets: 424 — ABNORMAL HIGH
RBC: 2.74 — ABNORMAL LOW
RBC: 2.77 — ABNORMAL LOW
RBC: 3.21 — ABNORMAL LOW
RDW: 12.5
RDW: 12.9
RDW: 13
RDW: 13.2
RDW: 13.4
WBC: 10.1
WBC: 6.4
WBC: 8.2
WBC: 8.5

## 2011-06-26 LAB — PROTIME-INR
INR: 2.1 — ABNORMAL HIGH
INR: 1
INR: 1
INR: 1.3
INR: 2.1 — ABNORMAL HIGH
INR: 2.2 — ABNORMAL HIGH
Prothrombin Time: 24.4 — ABNORMAL HIGH
Prothrombin Time: 13.3
Prothrombin Time: 13.8
Prothrombin Time: 16.1 — ABNORMAL HIGH
Prothrombin Time: 16.9 — ABNORMAL HIGH

## 2011-06-26 LAB — APTT: aPTT: 49 — ABNORMAL HIGH

## 2011-06-26 LAB — IGG, IGA, IGM
IgA: 134
IgM, Serum: 39 — ABNORMAL LOW

## 2011-06-26 LAB — HEPARIN LEVEL (UNFRACTIONATED)
Heparin Unfractionated: 0.51
Heparin Unfractionated: <0.1 — ABNORMAL LOW

## 2011-06-26 LAB — MAGNESIUM: Magnesium: 1.9

## 2011-06-26 LAB — LACTATE DEHYDROGENASE
LDH: 199
LDH: 205

## 2011-06-26 LAB — ANTITHROMBIN III: AntiThromb III Func: 106

## 2011-06-26 LAB — CARDIOLIPIN ANTIBODIES, IGG, IGM, IGA: Anticardiolipin IgA: <7 — ABNORMAL LOW (ref <13)

## 2011-06-26 LAB — TROPONIN I: Troponin I: 0.03

## 2011-06-26 LAB — URINE MICROSCOPIC-ADD ON

## 2011-06-26 LAB — TYPE AND SCREEN: ABO/RH(D): A POS

## 2011-06-26 LAB — LUPUS ANTICOAGULANT PANEL
DRVVT: 41.1 (ref 36.1–47.0)
PTT Lupus Anticoagulant: 65.1 — ABNORMAL HIGH (ref 36.3–48.8)
PTTLA 4:1 Mix: 59.1 — ABNORMAL HIGH (ref 36.3–48.8)
PTTLA Confirmation: 2.3 (ref <8.0)

## 2011-06-26 LAB — HEPARIN ANTI-XA: Heparin LMW: 0.99

## 2011-10-02 ENCOUNTER — Telehealth: Payer: Self-pay | Admitting: Cardiology

## 2011-10-02 ENCOUNTER — Other Ambulatory Visit (HOSPITAL_COMMUNITY): Payer: Self-pay | Admitting: Internal Medicine

## 2011-10-02 ENCOUNTER — Ambulatory Visit (HOSPITAL_COMMUNITY)
Admission: RE | Admit: 2011-10-02 | Discharge: 2011-10-02 | Disposition: A | Discharge status: Home / Self Care | Payer: 59 | Source: Ambulatory Visit | Attending: Internal Medicine | Admitting: Internal Medicine

## 2011-10-02 DIAGNOSIS — R52 Pain, unspecified: Secondary | ICD-10-CM

## 2011-10-02 DIAGNOSIS — M79609 Pain in unspecified limb: Secondary | ICD-10-CM | POA: Insufficient documentation

## 2011-10-02 DIAGNOSIS — Z86711 Personal history of pulmonary embolism: Secondary | ICD-10-CM | POA: Insufficient documentation

## 2011-10-02 NOTE — Telephone Encounter (Signed)
Patient is going to follow up with primary office this afternoon.  Will call back with update

## 2011-10-02 NOTE — Telephone Encounter (Signed)
Wants to give update on visit for doppler today

## 2011-10-02 NOTE — Telephone Encounter (Signed)
Information only---doppler neg for DVT

## 2011-10-02 NOTE — Telephone Encounter (Signed)
New Problem   Patient having tenderness in right calf, concerned of blood clot having previously experienced this event.  Please return call to patient at [facility] or mobile #.

## 2012-12-30 ENCOUNTER — Ambulatory Visit (INDEPENDENT_AMBULATORY_CARE_PROVIDER_SITE_OTHER): Payer: 59 | Admitting: Obstetrics and Gynecology

## 2012-12-30 ENCOUNTER — Encounter: Payer: Self-pay | Admitting: Obstetrics and Gynecology

## 2012-12-30 VITALS — BP 112/80 | HR 68 | Resp 16 | Ht 64.75 in | Wt 151.0 lb

## 2012-12-30 DIAGNOSIS — N926 Irregular menstruation, unspecified: Secondary | ICD-10-CM

## 2012-12-30 DIAGNOSIS — N930 Postcoital and contact bleeding: Secondary | ICD-10-CM

## 2012-12-30 NOTE — Progress Notes (Signed)
Patient ID: Judy Walter, female   DOB: 1971/06/23, 42 y.o.   MRN: [id number]  42 y.o.  Married  Caucasian female   [id number] here for problems with bleeding.  Due for annual exam in May. Patient is having bleeding with Mirena for 5 continuous days  and had post coital bleeding.  This was a one time occurrence.  Mirena due to be changed in March 2015.  No pain or cramping.  Patient ususally does not have any bleeding that she can track.    History of colposcopy in 2012.  No treatment.  No history of endometrial polyps or uterine fibroids    Patient had a history of postpartum bleeding and a pulmonary embolus.  Patient is not on anticoagulation at this time.  Takes an occasional ASA or NSAID.  States she has a diagnosis of a left ventral hernia.  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  No LMP recorded. Patient is not currently having periods (Reason: IUD).          Sexually active: yes  The current method of family planning is IUD.      Health Maintenance  Topic Date Due  . Tetanus/tdap  01/16/1989  . Influenza Vaccine  05/12/2013  . Pap Smear  12/07/2013    Family History  Problem Relation Age of Onset  . Coronary artery disease Father   . Coronary artery disease Paternal Grandmother     Patient Active Problem List  Diagnosis  . HYPERLIPIDEMIA TYPE I / IV  . PULMONARY EMBOLISM, HX OF    Past Medical History  Diagnosis Date  . Personal history of venous thrombosis and embolism     Past Surgical History  Procedure Laterality Date  . Wisdom teeth extracted    . C-sections  2002, 2008  . Dilation and curettage of uterus  1997    Allergies: Sulfonamide derivatives  Current Outpatient Prescriptions  Medication Sig Dispense Refill  . cetirizine (ZYRTEC ALLERGY) 10 MG tablet Take 10 mg by mouth daily.        Marland Kitchen ibuprofen (ADVIL,MOTRIN) 200 MG tablet Take 200 mg by mouth as needed.        . IUD's IUD by Intrauterine route as directed.        . ranitidine (ZANTAC) 150 MG tablet  Take 150 mg by mouth as needed.        Marland Kitchen aspirin 81 MG tablet Take 81 mg by mouth daily.         No current facility-administered medications for this visit.    ROS: Pertinent items are noted in HPI.  Social Hx:  Married.  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  Exam:    BP 112/80  Pulse 68  Resp 16  Ht 5' 4.75" (1.645 m)  Wt 151 lb (68.493 kg)  BMI 25.31 kg/m2   Wt Readings from Last 3 Encounters:  12/30/12 151 lb (68.493 kg)  09/20/10 160 lb 4 oz (72.689 kg)  04/14/09 158 lb (71.668 kg)     Ht Readings from Last 3 Encounters:  12/30/12 5' 4.75" (1.645 m)  09/20/10 5' 4.5" (1.638 m)  04/14/09 5' 4.5" (1.638 m)    General appearance: alert, cooperative and appears stated age   Pelvic: External genitalia:  no lesions              Urethra:  normal appearing urethra with no masses, tenderness or lesions  Bartholins and Skenes: normal                 Vagina: normal appearing vagina with normal color and discharge, no lesions              Cervix: normal appearance.  IUD strings seen.              Pap taken: no        Bimanual Exam:  Uterus:  uterus is normal size, shape, consistency and nontender                                      Adnexa: normal adnexa in size, nontender and no masses                                      Rectovaginal: Confirms                                      Anus:  normal sphincter tone, no lesions  A: Mirena IUD patient. Post coital bleeding episode.   Also had abnormal bleeding episode, possible polyp.  P: Return for saline ultrasound.  Procedure discussed with patient.  An After Visit Summary was printed and given to the patient.

## 2012-12-30 NOTE — Patient Instructions (Addendum)
Please continue to mark any bleeding episodes on your calendar.  We will re-evaluation when you return for your saline ultrasound.

## 2013-02-19 ENCOUNTER — Telehealth: Payer: Self-pay | Admitting: Obstetrics and Gynecology

## 2013-02-19 NOTE — Telephone Encounter (Signed)
LMTCB to schedule PUS-SHG. Still have not heard from patient.  5/7 spoke to pt. She is unable to pay at this time and said she would think about it and call back.

## 2013-02-20 NOTE — Telephone Encounter (Signed)
Routed to Dr. Edward Jolly. 5/7 spoke with patient and she is unable to pay. 6/11 LMTCB to schedule. Is it okay to file this precert?

## 2013-02-24 NOTE — Telephone Encounter (Signed)
Judy Walter,  Please contact the patient back and ask her to keep a bleeding calendar.  If any abnormal bleeding recurs, we can proceed with a routine pelvic ultrasound and then determine if anything else is necessary.  Thank you,  Conley Simmonds, M.D.

## 2013-03-07 NOTE — Telephone Encounter (Signed)
Spoke with patient about keeping a bleeding calendar. Patient agreed to keep a bleeding calendar for one month and to call with any abnormalities. Scheduled patient for annual on

## 2013-04-11 ENCOUNTER — Ambulatory Visit: Payer: 59 | Admitting: Obstetrics and Gynecology

## 2013-04-18 ENCOUNTER — Encounter: Payer: Self-pay | Admitting: Obstetrics and Gynecology

## 2013-04-18 ENCOUNTER — Ambulatory Visit (INDEPENDENT_AMBULATORY_CARE_PROVIDER_SITE_OTHER): Payer: 59 | Admitting: Obstetrics and Gynecology

## 2013-04-18 VITALS — BP 120/74 | HR 72 | Ht 64.75 in | Wt 155.0 lb

## 2013-04-18 DIAGNOSIS — Z01419 Encounter for gynecological examination (general) (routine) without abnormal findings: Secondary | ICD-10-CM

## 2013-04-18 NOTE — Patient Instructions (Signed)
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
## 2013-04-21 ENCOUNTER — Telehealth: Payer: Self-pay | Admitting: Obstetrics and Gynecology

## 2013-04-21 NOTE — Addendum Note (Signed)
Addended by: Conley Simmonds on: 04/21/2013 09:22 AM   Modules accepted: Orders

## 2013-04-21 NOTE — Telephone Encounter (Signed)
This encounter was opened in error.   No phone call was made or received.

## 2013-04-23 LAB — IPS PAP TEST WITH HPV

## 2013-05-05 ENCOUNTER — Ambulatory Visit
Admission: RE | Admit: 2013-05-05 | Discharge: 2013-05-05 | Disposition: A | Discharge status: Home / Self Care | Payer: 59 | Source: Ambulatory Visit | Attending: Obstetrics and Gynecology | Admitting: Obstetrics and Gynecology

## 2013-05-05 DIAGNOSIS — Z01419 Encounter for gynecological examination (general) (routine) without abnormal findings: Secondary | ICD-10-CM

## 2013-05-07 ENCOUNTER — Other Ambulatory Visit: Payer: Self-pay | Admitting: Obstetrics and Gynecology

## 2013-05-07 DIAGNOSIS — R928 Other abnormal and inconclusive findings on diagnostic imaging of breast: Secondary | ICD-10-CM

## 2013-05-16 NOTE — Progress Notes (Signed)
mammo hold entered cm

## 2013-05-26 ENCOUNTER — Ambulatory Visit
Admission: RE | Admit: 2013-05-26 | Discharge: 2013-05-26 | Disposition: A | Discharge status: Home / Self Care | Payer: 59 | Source: Ambulatory Visit | Attending: Obstetrics and Gynecology | Admitting: Obstetrics and Gynecology

## 2013-05-26 DIAGNOSIS — R928 Other abnormal and inconclusive findings on diagnostic imaging of breast: Secondary | ICD-10-CM

## 2013-05-27 ENCOUNTER — Other Ambulatory Visit: Payer: 59

## 2013-09-26 ENCOUNTER — Telehealth: Payer: Self-pay | Admitting: Obstetrics and Gynecology

## 2013-09-26 DIAGNOSIS — Z30430 Encounter for insertion of intrauterine contraceptive device: Secondary | ICD-10-CM

## 2013-09-26 DIAGNOSIS — Z30432 Encounter for removal of intrauterine contraceptive device: Secondary | ICD-10-CM

## 2013-09-26 NOTE — Telephone Encounter (Signed)
Pt's mirena is due to come out in March and she would like to schedule.

## 2013-09-26 NOTE — Telephone Encounter (Signed)
Ordered IUD removal and replacement for co-sign.  Message left to return call to [place] at [phone].

## 2013-09-29 NOTE — Telephone Encounter (Signed)
Thank you for facilitating this IUD exchange.

## 2013-09-29 NOTE — Telephone Encounter (Signed)
Spoke with patient and appointment scheduled for removal/replacement.  Pre procedure instructions given.  Motrin instructions given.   Motrin=Advil=Ibuprofen  800 mg one hour before procedure. Eat lunch and hydrate well before appointment.  Patient will call back with any concerns/questions prior to procedure.

## 2013-10-01 ENCOUNTER — Telehealth: Payer: Self-pay | Admitting: Obstetrics and Gynecology

## 2013-10-01 NOTE — Telephone Encounter (Signed)
Advised patient that per insurance quote, she will have 0 liability for iud removal/placement.

## 2013-11-10 ENCOUNTER — Ambulatory Visit (INDEPENDENT_AMBULATORY_CARE_PROVIDER_SITE_OTHER): Payer: 59 | Admitting: Obstetrics and Gynecology

## 2013-11-10 ENCOUNTER — Encounter: Payer: Self-pay | Admitting: Obstetrics and Gynecology

## 2013-11-10 VITALS — BP 120/62 | HR 50 | Ht 64.75 in | Wt 152.0 lb

## 2013-11-10 DIAGNOSIS — Z30432 Encounter for removal of intrauterine contraceptive device: Secondary | ICD-10-CM

## 2013-11-10 DIAGNOSIS — Z30433 Encounter for removal and reinsertion of intrauterine contraceptive device: Secondary | ICD-10-CM

## 2013-11-10 DIAGNOSIS — Z30430 Encounter for insertion of intrauterine contraceptive device: Secondary | ICD-10-CM

## 2013-11-10 LAB — POCT URINE PREGNANCY: PREG TEST UR: NEGATIVE

## 2013-11-10 NOTE — Progress Notes (Signed)
Patient ID: Judy Walter, female   DOB: 1971/02/19, 43 y.o.   MRN: [id number] GYNECOLOGY PROBLEM VISIT  PCP:  Dorrene German, MD  Referring provider:   HPI: 43 y.o.   Married  Caucasian  female   G4P0010 with No LMP recorded. Patient is not currently having periods (Reason: IUD).   here for   IUD removal and reinsertion.  Mirena placed November 18, 2008. Wants a pregnancy test prior to exchange.  Some nausea.  Some breakthrough bleeding for the last 6 months. Also having random post coital spotting.  Did a home UPT a week ago which was negative.   Fell on the ice one week ago and has left eye lid steristripped.   UPT today - negative  GYNECOLOGIC HISTORY: No LMP recorded. Patient is not currently having periods (Reason: IUD). Sexually active:  yes   Partner preference: female Contraception:   Mirena IUD Menopausal hormone therapy: no DES exposure:   no Blood transfusions:   no Sexually transmitted diseases:   no GYN procedures and prior surgeries:  C-section x2, D & C Last mammogram:  05-05-13 asymmetry of right breast but had diagnostic mammogram and ultrasound of right breast On 05-16-13 which revealed scattered fibroglandular density only.  Repeat screening in one year.              Last pap and high risk HPV testing:   04-21-13 ascus: neg HR HPV History of abnormal pap smear:  Abnormal pap 2012 ascus/colposcopy showed CIN I   OB History   Grav Para Term Preterm Abortions TAB SAB Ect Mult Living   4 3   1  1             Family History  Problem Relation Age of Onset  . Coronary artery disease Father   . Coronary artery disease Paternal Grandmother     Patient Active Problem List   Diagnosis Date Noted  . HYPERLIPIDEMIA TYPE I / IV 04/14/2009  . PULMONARY EMBOLISM, HX OF 04/03/2009    Past Medical History  Diagnosis Date  . Personal history of venous thrombosis and embolism     Past Surgical History  Procedure Laterality Date  . Wisdom teeth extracted    . C-sections   2002, 2008  . Dilation and curettage of uterus  1997    ALLERGIES: Sulfonamide derivatives  Current Outpatient Prescriptions  Medication Sig Dispense Refill  . Ascorbic Acid (VITAMIN C GUMMIE PO) Take 1 tablet by mouth daily.      Marland Kitchen aspirin 81 MG tablet Take 81 mg by mouth daily.        . cetirizine (ZYRTEC ALLERGY) 10 MG tablet Take 10 mg by mouth daily.        Marland Kitchen ibuprofen (ADVIL,MOTRIN) 200 MG tablet Take 200 mg by mouth as needed.        . IUD's IUD by Intrauterine route as directed.        . ranitidine (ZANTAC) 150 MG tablet Take 150 mg by mouth as needed.        . ergocalciferol (VITAMIN D2) 50000 UNITS capsule Take 50,000 Units by mouth once a week.       No current facility-administered medications for this visit.     ROS:  Pertinent items are noted in HPI.  SOCIAL HISTORY:    PHYSICAL EXAMINATION:    BP 120/62  Pulse 50  Ht 5' 4.75" (1.645 m)  Wt 152 lb (68.947 kg)  BMI 25.48 kg/m2   Wt Readings from  Last 3 Encounters:  11/10/13 152 lb (68.947 kg)  04/18/13 155 lb (70.308 kg)  12/30/12 151 lb (68.493 kg)     Ht Readings from Last 3 Encounters:  11/10/13 5' 4.75" (1.645 m)  04/18/13 5' 4.75" (1.645 m)  12/30/12 5' 4.75" (1.645 m)    General appearance: alert, cooperative and appears stated age   Pelvic: External genitalia:  no lesions              Urethra:  normal appearing urethra with no masses, tenderness or lesions              Bartholins and Skenes: normal                 Vagina: normal appearing vagina with normal color and discharge, no lesions              Cervix: normal appearance                   Bimanual Exam:  Uterus:  uterus is normal size, shape, consistency and nontender                                      Adnexa: normal adnexa in size, nontender and no masses                                         Procedure - Mirena IUD removal and Mirena IUD insertion Mirena lot # - [id number] Expiration - 08/16  Consent performed.  Speculum placed in  vagina. Sterile prep with Hibiclens. Rings forceps used to removed IUD without difficulty.  Uterine sound difficult to pass.  Paracervical block with 10 cc 1% lidocaine. Uterus sounded to 8 cm. Mirena placed without difficulty. Strings trimmed. No complications. EBL minimal.  Patient given Sprite following procedure due to nausea and slight dizziness - passed quickly.   ASSESSMENT  Mirena IUD removal and reinsertion of new Mirena IUD.  PLAN  NSAIDS prn.  Follow up in 5 weeks. Precautions give to call for fever, heavy bleeding, or increasing pain.   An After Visit Summary was printed and given to the patient.

## 2013-11-10 NOTE — Patient Instructions (Signed)
Intrauterine Device Insertion, Care After  Refer to this sheet in the next few weeks. These instructions provide you with information on caring for yourself after your procedure. Your health care provider may also give you more specific instructions. Your treatment has been planned according to current medical practices, but problems sometimes occur. Call your health care provider if you have any problems or questions after your procedure.  WHAT TO EXPECT AFTER THE PROCEDURE  Insertion of the IUD may cause some discomfort, such as cramping. The cramping should improve after the IUD is in place. You may have bleeding after the procedure. This is normal. It varies from light spotting for a few days to menstrual-like bleeding. When the IUD is in place, a string will extend past the cervix into the vagina for 1 2 inches. The strings should not bother you or your partner. If they do, talk to your health care provider.   HOME CARE INSTRUCTIONS    Check your intrauterine device (IUD) to make sure it is in place before you resume sexual activity. You should be able to feel the strings. If you cannot feel the strings, something may be wrong. The IUD may have fallen out of the uterus, or the uterus may have been punctured (perforated) during placement. Also, if the strings are getting longer, it may mean that the IUD is being forced out of the uterus. You no longer have full protection from pregnancy if any of these problems occur.   You may resume sexual intercourse if you are not having problems with the IUD. The copper IUD is considered immediately effective, and the hormone IUD works right away if inserted within 7 days of your period starting. You will need to use a backup method of birth control for 7 days if the IUD in inserted at any other time in your cycle.   Continue to check that the IUD is still in place by feeling for the strings after every menstrual period.   You may need to take pain medicine such as  acetaminophen or ibuprofen. Only take medicines as directed by your health care provider.  SEEK MEDICAL CARE IF:    You have bleeding that is heavier or lasts longer than a normal menstrual cycle.   You have a fever.   You have increasing cramps or abdominal pain not relieved with medicine.   You have abdominal pain that does not seem to be related to the same area of earlier cramping and pain.   You are lightheaded, unusually weak, or faint.   You have abnormal vaginal discharge or smells.   You have pain during sexual intercourse.   You cannot feel the IUD strings, or the IUD string has gotten longer.   You feel the IUD at the opening of the cervix in the vagina.   You think you are pregnant, or you miss your menstrual period.   The IUD string is hurting your sex partner.  MAKE SURE YOU:   Understand these instructions.   Will watch your condition.   Will get help right away if you are not doing well or get worse.  Document Released: 04/26/2011 Document Revised: 06/18/2013 Document Reviewed: 02/16/2013  Exit[employer] Patient Information 2014 [employer]

## 2013-11-10 NOTE — Addendum Note (Signed)
Addended by: Lowella Fairy on: 11/10/2013 02:59 PM   Modules accepted: Orders

## 2013-12-19 ENCOUNTER — Ambulatory Visit: Payer: 59 | Admitting: Obstetrics and Gynecology

## 2014-01-09 ENCOUNTER — Ambulatory Visit (INDEPENDENT_AMBULATORY_CARE_PROVIDER_SITE_OTHER): Payer: 59 | Admitting: Obstetrics and Gynecology

## 2014-01-09 ENCOUNTER — Encounter: Payer: Self-pay | Admitting: Obstetrics and Gynecology

## 2014-01-09 VITALS — BP 110/82 | HR 76 | Ht 64.75 in | Wt 148.8 lb

## 2014-01-09 DIAGNOSIS — Z30431 Encounter for routine checking of intrauterine contraceptive device: Secondary | ICD-10-CM

## 2014-01-09 NOTE — Progress Notes (Signed)
Patient ID: Judy Walter, female   DOB: 20-May-1971, 43 y.o.   MRN: [id number] GYNECOLOGY  VISIT   HPI: 43 y.o.   Married  Caucasian  female   G4P0010 with No LMP recorded. Patient is not currently having periods (Reason: IUD).   here for 8 week follow up on IUD removal and insertion of new Mirena - date was 11/10/13. No bleeding. No postcoital bleeding.  No cramping or pain.  Partner has not said anything about the strings being uncomfortable.   GYNECOLOGIC HISTORY: No LMP recorded. Patient is not currently having periods (Reason: IUD). Contraception:   Mirena IUD Menopausal hormone therapy: N/A        OB History   Grav Para Term Preterm Abortions TAB SAB Ect Mult Living   4 3   1  1             Patient Active Problem List   Diagnosis Date Noted  . HYPERLIPIDEMIA TYPE I / IV 04/14/2009  . PULMONARY EMBOLISM, HX OF 04/03/2009    Past Medical History  Diagnosis Date  . Personal history of venous thrombosis and embolism     Past Surgical History  Procedure Laterality Date  . Wisdom teeth extracted    . C-sections  2002, 2008  . Dilation and curettage of uterus  1997    Current Outpatient Prescriptions  Medication Sig Dispense Refill  . aspirin 81 MG tablet Take 81 mg by mouth daily.        . cetirizine (ZYRTEC ALLERGY) 10 MG tablet Take 10 mg by mouth daily.        . ergocalciferol (VITAMIN D2) 50000 UNITS capsule Take 50,000 Units by mouth once a week.      Marland Kitchen ibuprofen (ADVIL,MOTRIN) 200 MG tablet Take 200 mg by mouth as needed.        Marland Kitchen levonorgestrel (MIRENA) 20 MCG/24HR IUD 1 each by Intrauterine route once.      . ranitidine (ZANTAC) 150 MG tablet Take 150 mg by mouth as needed.         No current facility-administered medications for this visit.     ALLERGIES: Sulfonamide derivatives  Family History  Problem Relation Age of Onset  . Coronary artery disease Father   . Coronary artery disease Paternal Grandmother     History   Social History  . Marital  Status: Married    Spouse Name: N/A    Number of Children: N/A  . Years of Education: N/A   Occupational History  . Not on file.   Social History Main Topics  . Smoking status: Never Smoker   . Smokeless tobacco: Never Used  . Alcohol Use: No     Comment: rare wine  . Drug Use: No  . Sexual Activity: Yes    Partners: Male    Birth Control/ Protection: IUD     Comment: Mirena inserted 11-10-13    Other Topics Concern  . Not on file   Social History Narrative   Full time...RN...orthopedic floor. Regularly exercises- walks.     ROS:  Pertinent items are noted in HPI.  PHYSICAL EXAMINATION:    BP 110/82  Pulse 76  Ht 5' 4.75" (1.645 m)  Wt 148 lb 12.8 oz (67.495 kg)  BMI 24.94 kg/m2     General appearance: alert, cooperative and appears stated age    Pelvic: External genitalia:  no lesions              Urethra:  normal appearing urethra  with no masses, tenderness or lesions              Bartholins and Skenes: normal                 Vagina: normal appearing vagina with normal color and discharge, no lesions              Cervix: normal appearance.  IUD string noted - one longer than the other so trimmed so both are 1 1/2 cm.                    Bimanual Exam:  Uterus:  uterus is normal size, shape, consistency and nontender                                      Adnexa: normal adnexa in size, nontender and no masses                                         ASSESSMENT  Mirena IUD surveillance.  Doing well.  Amenorrhea.  PLAN  Return for annual exam and prn.    An After Visit Summary was printed and given to the patient.  __15____ minutes face to face time of which over 50% was spent in counseling.

## 2014-01-09 NOTE — Patient Instructions (Signed)
Keep your appointment for your annual exam and call if you need anything!

## 2014-03-04 ENCOUNTER — Ambulatory Visit: Payer: 59 | Admitting: Obstetrics and Gynecology

## 2014-04-20 ENCOUNTER — Encounter: Payer: Self-pay | Admitting: Obstetrics and Gynecology

## 2014-04-20 ENCOUNTER — Ambulatory Visit (INDEPENDENT_AMBULATORY_CARE_PROVIDER_SITE_OTHER): Payer: 59 | Admitting: Obstetrics and Gynecology

## 2014-04-20 VITALS — BP 100/60 | HR 50 | Resp 14 | Ht 64.75 in | Wt 149.8 lb

## 2014-04-20 DIAGNOSIS — N760 Acute vaginitis: Secondary | ICD-10-CM

## 2014-04-20 DIAGNOSIS — Z01419 Encounter for gynecological examination (general) (routine) without abnormal findings: Secondary | ICD-10-CM

## 2014-04-20 DIAGNOSIS — Z0000 Encounter for general adult medical examination without abnormal findings: Secondary | ICD-10-CM

## 2014-04-20 LAB — POCT URINALYSIS DIPSTICK
BILIRUBIN UA: NEGATIVE
GLUCOSE UA: NEGATIVE
KETONES UA: NEGATIVE
LEUKOCYTES UA: NEGATIVE
NITRITE UA: NEGATIVE
PH UA: 5
Protein, UA: NEGATIVE
RBC UA: NEGATIVE
Urobilinogen, UA: NEGATIVE

## 2014-04-20 MED ORDER — FLUCONAZOLE 150 MG PO TABS: 150.0000 mg | ORAL_TABLET | Freq: Once | ORAL | Status: DC

## 2014-04-20 NOTE — Patient Instructions (Signed)

## 2014-04-20 NOTE — Progress Notes (Signed)
Patient ID: Judy Walter, female   DOB: 1971-06-26, 43 y.o.   MRN: [id number] GYNECOLOGY VISIT  PCP:  Dr. Sander Radon, [place]  Referring provider:   HPI: 43 y.o.   Married  Caucasian  female   G4P0010 with No LMP recorded. Patient is not currently having periods (Reason: IUD).   here for   AEX. Self treated for yeast.  Monistat for 3 days. Having discomfort with intercourse.  Wants recheck on this.  Recently was on abx for URI - Ceftin.   No cycles with Mirena IUD.   Patient  is going back to school on line.  Going for a bachelor's degree.   Hgb:    PCP Urine:  Neg  GYNECOLOGIC HISTORY: No LMP recorded. Patient is not currently having periods (Reason: IUD). Sexually active:  yes Partner preference: female Contraception:  Mirena IUD--inserted 11-10-13  Menopausal hormone therapy: n/a DES exposure:  no  Blood transfusions:   no Sexually transmitted diseases:  no GYN procedures and prior surgeries:  D & C Last mammogram: 05-05-13 wnl except for asymmetry in right breast.  Diagnostic right mammogram and ultrasound performed 05-26-13 revealed fat and fibroglandular tissue throughout upper outer quadrant of right breast.  No masses.  Suggest screening mammogram in one year.          Last pap and high risk HPV testing: 04-21-13 ASCUS:neg HR HPV .  Suggest repeat pap in one year. History of abnormal pap smear:  2012 ASCUS with colpo CIN I   OB History   Grav Para Term Preterm Abortions TAB SAB Ect Mult Living   4 3   1  1           LIFESTYLE: Exercise:   no           Tobacco:   no Alcohol:   no Drug use:  no  OTHER HEALTH MAINTENANCE: Tetanus/TDap:  Up to date Gardisil:             n/a Influenza:          06/2013 Zostavax:          n/a  Bone density:     Greater than 10 years FAO:ZHYQMV Colonoscopy:     n/a  Cholesterol check:  08/2013 borderline - PCP.  Family History  Problem Relation Age of Onset  . Coronary artery disease Father   . Hypertension Father   .  Coronary artery disease Paternal Grandmother   . Diabetes Paternal Grandmother   . Hypertension Paternal Grandmother   . Breast cancer Maternal Grandmother     Patient Active Problem List   Diagnosis Date Noted  . HYPERLIPIDEMIA TYPE I / IV 04/14/2009  . PULMONARY EMBOLISM, HX OF 04/03/2009   Past Medical History  Diagnosis Date  . Personal history of venous thrombosis and embolism     Past Surgical History  Procedure Laterality Date  . Wisdom teeth extracted    . C-sections  2002, 2008  . Dilation and curettage of uterus  1997    ALLERGIES: Sulfonamide derivatives  Current Outpatient Prescriptions  Medication Sig Dispense Refill  . aspirin 81 MG tablet Take 81 mg by mouth daily.        . cetirizine (ZYRTEC ALLERGY) 10 MG tablet Take 10 mg by mouth daily.        Marland Kitchen ibuprofen (ADVIL,MOTRIN) 200 MG tablet Take 200 mg by mouth as needed.        Marland Kitchen levonorgestrel (MIRENA) 20 MCG/24HR IUD 1 each by Intrauterine  route once.      . ergocalciferol (VITAMIN D2) 50000 UNITS capsule Take 50,000 Units by mouth once a week.       No current facility-administered medications for this visit.     ROS:  Pertinent items are noted in HPI.  SOCIAL HISTORY:  Therapist, sports.   PHYSICAL EXAMINATION:    BP 100/60  Pulse 50  Resp 14  Ht 5' 4.75" (1.645 m)  Wt 149 lb 12.8 oz (67.949 kg)  BMI 25.11 kg/m2   Wt Readings from Last 3 Encounters:  04/20/14 149 lb 12.8 oz (67.949 kg)  01/09/14 148 lb 12.8 oz (67.495 kg)  11/10/13 152 lb (68.947 kg)     Ht Readings from Last 3 Encounters:  04/20/14 5' 4.75" (1.645 m)  01/09/14 5' 4.75" (1.645 m)  11/10/13 5' 4.75" (1.645 m)    General appearance: alert, cooperative and appears stated age Head: Normocephalic, without obvious abnormality, atraumatic Neck: no adenopathy, supple, symmetrical, trachea midline and thyroid not enlarged, symmetric, no tenderness/mass/nodules Lungs: clear to auscultation bilaterally Breasts: Inspection negative, No nipple  retraction or dimpling, No nipple discharge or bleeding, No axillary or supraclavicular adenopathy, Normal to palpation without dominant masses Heart: regular rate and rhythm Abdomen: soft, non-tender; no masses,  no organomegaly Extremities: extremities normal, atraumatic, no cyanosis or edema Skin: Skin color, texture, turgor normal. No rashes or lesions Lymph nodes: Cervical, supraclavicular, and axillary nodes normal. No abnormal inguinal nodes palpated Neurologic: Grossly normal  Pelvic: External genitalia:  no lesions              Urethra:  normal appearing urethra with no masses, tenderness or lesions              Bartholins and Skenes: normal                 Vagina: normal appearing vagina with normal color and discharge, no lesions              Cervix: normal appearance.  IUD strings seen.               Pap and high risk HPV testing done: Yes.  .            Bimanual Exam:  Uterus:  uterus is normal size, shape, consistency and nontender                                      Adnexa: normal adnexa in size, nontender and no masses                                      Rectovaginal: Confirms                                      Anus:  normal sphincter tone, no lesions  Wet prep - pH 5.0.  Negative yeast, clue cells, trichomonas.   ASSESSMENT  Normal gynecologic exam. History of ASCUS and negative HR HPV.  History of prior CIN I.  Mirena IUD patient.  History of postpartum PE.  Vaginitis following abx.  I suspect Candida.   PLAN  Mammogram recommended yearly.  Pap smear and high risk HPV testing performed.  Counseled on self breast exam, Calcium and vitamin D intake, exercise.  Diflucan,  See Epic orders.  Return annually or prn   An After Visit Summary was printed and given to the patient.

## 2014-04-23 LAB — IPS PAP TEST WITH HPV

## 2014-04-29 ENCOUNTER — Other Ambulatory Visit: Payer: Self-pay

## 2014-04-29 DIAGNOSIS — Z1231 Encounter for screening mammogram for malignant neoplasm of breast: Secondary | ICD-10-CM

## 2014-05-04 ENCOUNTER — Telehealth: Payer: Self-pay

## 2014-05-04 NOTE — Telephone Encounter (Signed)
Opened in error

## 2014-05-12 ENCOUNTER — Ambulatory Visit
Admission: RE | Admit: 2014-05-12 | Discharge: 2014-05-12 | Disposition: A | Discharge status: Home / Self Care | Payer: 59 | Source: Ambulatory Visit

## 2014-05-12 DIAGNOSIS — Z1231 Encounter for screening mammogram for malignant neoplasm of breast: Secondary | ICD-10-CM

## 2014-07-13 ENCOUNTER — Encounter: Payer: Self-pay | Admitting: Obstetrics and Gynecology

## 2014-08-11 DIAGNOSIS — M8580 Other specified disorders of bone density and structure, unspecified site: Secondary | ICD-10-CM

## 2014-08-11 HISTORY — DX: Other specified disorders of bone density and structure, unspecified site: M85.80

## 2015-05-28 ENCOUNTER — Ambulatory Visit: Payer: 59 | Admitting: Obstetrics and Gynecology

## 2015-06-07 ENCOUNTER — Encounter: Payer: Self-pay | Admitting: Obstetrics and Gynecology

## 2015-06-07 ENCOUNTER — Ambulatory Visit (INDEPENDENT_AMBULATORY_CARE_PROVIDER_SITE_OTHER): Payer: 59 | Admitting: Obstetrics and Gynecology

## 2015-06-07 VITALS — BP 120/84 | HR 60 | Resp 18 | Ht 64.5 in | Wt 157.6 lb

## 2015-06-07 DIAGNOSIS — Z0000 Encounter for general adult medical examination without abnormal findings: Secondary | ICD-10-CM | POA: Diagnosis not present

## 2015-06-07 DIAGNOSIS — N819 Female genital prolapse, unspecified: Secondary | ICD-10-CM | POA: Diagnosis not present

## 2015-06-07 DIAGNOSIS — Z01419 Encounter for gynecological examination (general) (routine) without abnormal findings: Secondary | ICD-10-CM | POA: Diagnosis not present

## 2015-06-07 DIAGNOSIS — N393 Stress incontinence (female) (male): Secondary | ICD-10-CM

## 2015-06-07 LAB — POCT URINALYSIS DIPSTICK
Bilirubin, UA: NEGATIVE
Blood, UA: NEGATIVE
Glucose, UA: NEGATIVE
KETONES UA: NEGATIVE
LEUKOCYTES UA: NEGATIVE
NITRITE UA: NEGATIVE
PH UA: 6
PROTEIN UA: NEGATIVE
UROBILINOGEN UA: NEGATIVE

## 2015-06-07 NOTE — Progress Notes (Signed)
Patient ID: Judy Walter, female   DOB: 09-17-1970, 44 y.o.   MRN: [id number] 44 y.o. [id number] Married Caucasian female here for annual exam.   LMP was about September 2015.  Some leakage of urine with cough and sneeze.   Recent URI tx with Azithromycin.   New diagnosis of osteopenia. Patient is doing treatment with vit D only.  Declined  Fosamax.  Father just had surgery for prostate cancer.   Working a lot.  Is starting her bachelor's degree.   All deliveries 9 + pounds.   2 Cesarean Sections and one vaginal delivery.   PCP:  Jerene Bears, MD    No LMP recorded. Patient is not currently having periods (Reason: IUD).          Sexually active: Yes.  female partner  The current method of family planning is IUD--Mirena inserted 11-10-13.    Exercising: No.   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 MMG:  05-12-14 Density Cat.B/Neg/BiRads 1:[facility]. Colonoscopy:  n/a BMD:   08/2014  Result  Osteopenia with PCP in [place]  TDaP:  Up to date through work Screening Labs:  Hb today: PCP, Urine today: Neg   reports that she has never smoked. She has never used smokeless tobacco. She reports that she does not drink alcohol or use illicit drugs.  Past Medical History  Diagnosis Date  . Personal history of venous thrombosis and embolism   . Osteopenia 08/2014    Past Surgical History  Procedure Laterality Date  . Wisdom teeth extracted    . C-sections  2002, 2008  . Dilation and curettage of uterus  1997    Current Outpatient Prescriptions  Medication Sig Dispense Refill  . Calcium-Magnesium-Vitamin D (CALCIUM 1200+D3) 600-40-500 MG-MG-UNIT TB24 Take 2 tablets by mouth daily.    . cetirizine (ZYRTEC ALLERGY) 10 MG tablet Take 10 mg by mouth daily.      . Cholecalciferol (VITAMIN D3) 5000 UNITS TABS Take 1 tablet by mouth daily.    . Cyanocobalamin (RA VITAMIN B-12) 1000 MCG/ML  LIQD Take 1 Dose by mouth daily.    . fluconazole (DIFLUCAN) 150 MG tablet Take 1 tablet (150 mg total) by mouth once. Take one tablet.  Repeat in 48 hours if symptoms are not completely resolved. 2 tablet 0  . ibuprofen (ADVIL,MOTRIN) 200 MG tablet Take 200 mg by mouth as needed.      Marland Kitchen levonorgestrel (MIRENA) 20 MCG/24HR IUD 1 each by Intrauterine route once.     No current facility-administered medications for this visit.    Family History  Problem Relation Age of Onset  . Coronary artery disease Father   . Hypertension Father   . Cancer Father 10    prostate CA  . Coronary artery disease Paternal Grandmother   . Diabetes Paternal Grandmother   . Hypertension Paternal Grandmother   . Breast cancer Maternal Grandmother     ROS:  Pertinent items are noted in HPI.  Otherwise, a comprehensive ROS was negative.  Exam:   BP 120/84 mmHg  Pulse 60  Resp 18  Ht 5' 4.5" (1.638 m)  Wt 157 lb 9.6 oz (71.487 kg)  BMI 26.64 kg/m2    General appearance: alert, cooperative and appears stated age Head: Normocephalic, without obvious abnormality, atraumatic Neck: no adenopathy, supple, symmetrical, trachea midline and thyroid normal to inspection and palpation Lungs: clear to auscultation bilaterally  Breasts: normal appearance, no masses or tenderness, Inspection negative, No nipple retraction or dimpling, No nipple discharge or bleeding, No axillary or supraclavicular adenopathy Heart: regular rate and rhythm Abdomen: soft, non-tender; bowel sounds normal; no masses,  no organomegaly Extremities: extremities normal, atraumatic, no cyanosis or edema Skin: Skin color, texture, turgor normal. No rashes or lesions Lymph nodes: Cervical, supraclavicular, and axillary nodes normal. No abnormal inguinal nodes palpated Neurologic: Grossly normal  Pelvic: External genitalia:  no lesions              Urethra:  normal appearing urethra with no masses, tenderness or lesions              Bartholins  and Skenes: normal                 Vagina: normal appearing vagina with normal color and discharge, no lesions.   First degree cystocele  First degree rectocele.  No uterine prolapse.              Cervix: no lesions.  IUD strings seen.               Pap taken: Yes.   Bimanual Exam:  Uterus:  normal size, contour, position, consistency, mobility, non-tender              Adnexa: normal adnexa and no mass, fullness, tenderness              Rectovaginal: Yes.  .  Confirms.              Anus:  normal sphincter tone, no lesions  Chaperone was present for exam.  Assessment:   Well woman visit with normal exam. Mirena IUD patient.  Hx ASCUS paps and negative HR HPV.  Prior LGSIL. Hx PE.  Osteopenia.  Mild pelvic organ prolapse and stress incontinence.   Plan: Yearly mammogram recommended after age 58.  Recommended self breast exam.  Pap and HR HPV as above. Discussed Calcium, Vitamin D, regular exercise program including cardiovascular and weight bearing exercise. Labs performed.  No..   See orders. Refills given on medications.  No..  See orders. Kegel's.  Discussed pelvic organ prolapse and urinary stress incontinence.  [facility] handouts given.  Discussed physical therapy if desired.  Surgery is an option if symptoms progress.   Follow up annually and prn.      After visit summary provided.

## 2015-06-07 NOTE — Patient Instructions (Signed)

## 2015-06-09 LAB — IPS PAP TEST WITH HPV

## 2015-06-12 DIAGNOSIS — R87619 Unspecified abnormal cytological findings in specimens from cervix uteri: Secondary | ICD-10-CM

## 2015-06-12 HISTORY — DX: Unspecified abnormal cytological findings in specimens from cervix uteri: R87.619

## 2015-06-14 ENCOUNTER — Telehealth: Payer: Self-pay | Admitting: Emergency Medicine

## 2015-06-14 DIAGNOSIS — R87610 Atypical squamous cells of undetermined significance on cytologic smear of cervix (ASC-US): Secondary | ICD-10-CM

## 2015-06-14 NOTE — Telephone Encounter (Signed)
-----   Message from Nunzio Cobbs, MD sent at 06/09/2015 11:29 PM EDT ----- Please inform patient of pap continuing to show ASCUS and negative HR HPV.  Due to the chronicity of the pap showing this same reading, I am recommending a colposcopy with me.  Cc- Marisa Sprinkles

## 2015-06-14 NOTE — Telephone Encounter (Signed)
Patient notified of message from Dr. Quincy Simmonds.  She is agreeable to scheduling colposcopy. Scheduled 07/07/15 at 1000 Brief description of procedure given to patient.  Colposcopy pre-procedure instructions given. Discussed menses and need to not have any bleeding on day of appointment, advised to call to reschedule if starts cycle. Patient states she does not cycle with Mirena IUD.  Make sure to eat a meal and hydrate before appointment.  Advised 800 mg of Motrin PO with food one hour prior to appointment.     Patient is advised she will be contacted with insurance coverage information. cc Kerry Hough  Routing to provider for final review. Patient agreeable to disposition. Will close encounter.

## 2015-06-23 ENCOUNTER — Telehealth: Payer: Self-pay | Admitting: Obstetrics and Gynecology

## 2015-06-23 NOTE — Telephone Encounter (Signed)
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

## 2015-06-23 NOTE — Telephone Encounter (Signed)
Call to patient. Advised typically no lifting restrictions but that vigorous activity not recommended. Patient states she has to work the next day after the colposcopy but will make arrangements at work to switch with another employee. Patient states that it will work out and she will plan to continue activities per normal after colposcopy. Appointment date and time confirmed with patient.  Routing to provider for final review. Patient agreeable to disposition. Will close encounter.

## 2015-07-05 ENCOUNTER — Telehealth: Payer: Self-pay | Admitting: Obstetrics and Gynecology

## 2015-07-05 NOTE — Telephone Encounter (Signed)
Spoke with patient. Patient is out of town and will not be back until Tuesday night or Wednesday. Would like to reschedule her colposcopy procedure. Does not have cycle with Mirena IUD. Appointment rescheduled to 07/16/2015 at 10 am with Dr.Silva. Agreeable to date and time.  Instructions given. Motrin 800 mg po x , one hour before appointment with food. Make sure to eat a meal before appointment and drink plenty of fluids. Patient verbalized understanding and will call to reschedule if will be on menses or has any concerns regarding pregnancy.   Routing to provider for final review. Patient agreeable to disposition. Will close encounter.

## 2015-07-05 NOTE — Telephone Encounter (Signed)
Patient wants to reschedule her appointment for colposcopy. She is out of town and will not get back until lare Tuesday evening.

## 2015-07-07 ENCOUNTER — Ambulatory Visit: Payer: 59 | Admitting: Obstetrics and Gynecology

## 2015-07-15 ENCOUNTER — Other Ambulatory Visit: Payer: Self-pay

## 2015-07-15 DIAGNOSIS — Z1231 Encounter for screening mammogram for malignant neoplasm of breast: Secondary | ICD-10-CM

## 2015-07-16 ENCOUNTER — Encounter: Payer: Self-pay | Admitting: Obstetrics and Gynecology

## 2015-07-16 ENCOUNTER — Ambulatory Visit (INDEPENDENT_AMBULATORY_CARE_PROVIDER_SITE_OTHER): Payer: 59 | Admitting: Obstetrics and Gynecology

## 2015-07-16 DIAGNOSIS — R87610 Atypical squamous cells of undetermined significance on cytologic smear of cervix (ASC-US): Secondary | ICD-10-CM

## 2015-07-16 NOTE — Patient Instructions (Signed)

## 2015-07-16 NOTE — Progress Notes (Signed)
Subjective:     Patient ID: Judy Walter, female   DOB: 04-27-1971, 44 y.o.   MRN: [id number]  HPI : 44 y.o.   Married  Caucasian  female   G4P0010 with Patient's last menstrual period was 05/12/2014 (approximate).   here for Colposcopy    Having recurrent paps showing ASCUS and negative HR HPV.  2012, ASCUS, colpo CIN-I  GYNECOLOGIC HISTORY: Patient's last menstrual period was 05/12/2014 (approximate). Contraception:IUD  Menopausal hormone therapy: None Last mammogram: 05/12/14 BIRADS1:neg Last pap smear: 06/07/15 ASCUS. HR HPV:neg   Review of Systems     Objective:   Physical Exam  Genitourinary:     Colposcopy. Consent for procedure.  Speculum in vagina.  3% acetic acid to vagina and cervix. IUD strings noted.  Colposcopy satisfactory.   Beefy red changes at 11;00 and 5:00 - 7:00.  Possible squamous metaplasia.  No acetowhite changes. ECC taken and to pathology.  Biopsy at 1:00 taken and to pathology.  Monsel's placed.  No complications.  Minimal EBL.     Assessment:     Persistent ASCUS paps and negative HR HPV.  Hx of LGSIL.     Plan:     Follow up biopsies.  Prior paps and colpo results reviewed. Instructions and precautions given.  Anticipate cotesting in one year.     After visit summary to patient.

## 2015-07-20 LAB — IPS OTHER TISSUE BIOPSY

## 2015-07-21 ENCOUNTER — Telehealth: Payer: Self-pay

## 2015-07-21 NOTE — Telephone Encounter (Signed)
Spoke with patient. Advised of results as seen below from [place]. Patient is agreeable and verbalizes understanding. 08 recall entered.  Routing to provider for final review. Patient agreeable to disposition. Will close encounter.

## 2015-07-21 NOTE — Telephone Encounter (Signed)
-----   Message from Nunzio Cobbs, MD sent at 07/20/2015  6:57 PM EST ----- Please report colposcopy results showing biopsy of exocervix - LGSIL and ECC benign.  There is no sign of cancer. This correlates with her ASCUS pap.  I am recommending cotesting in one year.  No treatment is needed. Please place recall - 08.    Cc- Marisa Sprinkles

## 2015-08-19 ENCOUNTER — Emergency Department (HOSPITAL_COMMUNITY): Payer: 59

## 2015-08-19 ENCOUNTER — Emergency Department (HOSPITAL_COMMUNITY)
Admission: EM | Admit: 2015-08-19 | Discharge: 2015-08-19 | Disposition: A | Discharge status: Home / Self Care | Payer: 59 | Attending: Physician Assistant | Admitting: Physician Assistant

## 2015-08-19 ENCOUNTER — Encounter (HOSPITAL_COMMUNITY): Payer: Self-pay | Admitting: Radiology

## 2015-08-19 DIAGNOSIS — M8580 Other specified disorders of bone density and structure, unspecified site: Secondary | ICD-10-CM | POA: Diagnosis not present

## 2015-08-19 DIAGNOSIS — R110 Nausea: Secondary | ICD-10-CM | POA: Insufficient documentation

## 2015-08-19 DIAGNOSIS — R0602 Shortness of breath: Secondary | ICD-10-CM | POA: Diagnosis not present

## 2015-08-19 DIAGNOSIS — R002 Palpitations: Secondary | ICD-10-CM | POA: Insufficient documentation

## 2015-08-19 DIAGNOSIS — Z86718 Personal history of other venous thrombosis and embolism: Secondary | ICD-10-CM | POA: Diagnosis not present

## 2015-08-19 DIAGNOSIS — Z79899 Other long term (current) drug therapy: Secondary | ICD-10-CM | POA: Insufficient documentation

## 2015-08-19 LAB — CBC
HEMATOCRIT: 41.9 % (ref 36.0–46.0)
Hemoglobin: 14.6 g/dL (ref 12.0–15.0)
MCH: 32.7 pg (ref 26.0–34.0)
MCHC: 34.8 g/dL (ref 30.0–36.0)
MCV: 93.9 fL (ref 78.0–100.0)
Platelets: 251 10*3/uL (ref 150–400)
RBC: 4.46 MIL/uL (ref 3.87–5.11)
RDW: 12.3 % (ref 11.5–15.5)
WBC: 7.4 10*3/uL (ref 4.0–10.5)

## 2015-08-19 LAB — BASIC METABOLIC PANEL
Anion gap: 8 (ref 5–15)
BUN: 15 mg/dL (ref 6–20)
CHLORIDE: 105 mmol/L (ref 101–111)
CO2: 26 mmol/L (ref 22–32)
Calcium: 9.8 mg/dL (ref 8.9–10.3)
Creatinine, Ser: 0.57 mg/dL (ref 0.44–1.00)
GFR calc Af Amer: >60 mL/min (ref >60)
GFR calc non Af Amer: >60 mL/min (ref >60)
Glucose, Bld: 106 mg/dL — ABNORMAL HIGH (ref 65–99)
POTASSIUM: 4 mmol/L (ref 3.5–5.1)
SODIUM: 139 mmol/L (ref 135–145)

## 2015-08-19 LAB — I-STAT TROPONIN, ED: Troponin i, poc: 0 ng/mL (ref 0.00–0.08)

## 2015-08-19 MED ORDER — IOHEXOL 350 MG/ML SOLN
100.0000 mL | Freq: Once | INTRAVENOUS | Status: AC | PRN
  Administered 2015-08-19: 61 mL via INTRAVENOUS

## 2015-08-19 NOTE — ED Notes (Signed)
Pt c/o sustained palpitations with dizziness, weakness, SOB, and nausea onset today, states that she felt ill last night, had some emesis and diarrhea today. Reports SOB and nausea now, worsened during episodes of palpitations. Pt reports hx of PE after her 3rd pregnancy.

## 2015-08-19 NOTE — ED Notes (Signed)
Patient ambulated with pulse ox heart rate stayed within 75-85, and oxygen 95-100

## 2015-08-19 NOTE — ED Provider Notes (Signed)
CSN: [id number]     Arrival date & time 08/19/15  [id number] History   First MD Initiated Contact with Patient 08/19/15 1027     Chief Complaint  Patient presents with  . Shortness of Breath     (Consider location/radiation/quality/duration/timing/severity/associated sxs/prior Treatment) HPI Patient presents to the emergency department with an episode of palpitations, dizziness, with some shortness of breath and nausea that occurred this morning while at work.  The patient states that she had one episode previously similar to this in the past.  The patient states that she has had down and felt better, but her boss brought her down to the emergency department.  The patient states that she did not have any vomiting, diarrhea, lightheadedness, cough, fever, dysuria, incontinence, near syncope or syncope.  The patient states that she did not have any chest pain that this episode.  Patient states nothing seems make her condition better.  She did notice that the heart rate and palpitations more on exertion Past Medical History  Diagnosis Date  . Personal history of venous thrombosis and embolism   . Osteopenia 08/2014  . Abnormal Pap smear of cervix 06/2015    ASCUS    Past Surgical History  Procedure Laterality Date  . Wisdom teeth extracted    . C-sections  2002, 2008  . Dilation and curettage of uterus  1997   Family History  Problem Relation Age of Onset  . Coronary artery disease Father   . Hypertension Father   . Cancer Father 85    prostate CA  . Coronary artery disease Paternal Grandmother   . Diabetes Paternal Grandmother   . Hypertension Paternal Grandmother   . Breast cancer Maternal Grandmother    Social History  Substance Use Topics  . Smoking status: Never Smoker   . Smokeless tobacco: Never Used  . Alcohol Use: No     Comment: rare wine   OB History    Gravida Para Term Preterm AB TAB SAB Ectopic Multiple Living   4 3   1  1         Review of Systems All other  systems negative except as documented in the HPI. All pertinent positives and negatives as reviewed in the HPI.    Allergies  Sulfonamide derivatives  Home Medications   Prior to Admission medications   Medication Sig Start Date End Date Taking? Authorizing Provider  Calcium-Magnesium-Vitamin D (CALCIUM 1200+D3) 600-40-500 MG-MG-UNIT TB24 Take 2 tablets by mouth daily.   Yes Historical Provider, MD  ibuprofen (ADVIL,MOTRIN) 200 MG tablet Take 400 mg by mouth 2 (two) times daily as needed for headache or moderate pain.    Yes Historical Provider, MD  levonorgestrel (MIRENA) 20 MCG/24HR IUD 1 each by Intrauterine route once.   Yes Historical Provider, MD  loratadine (CLARITIN) 10 MG tablet Take 10 mg by mouth daily.   Yes Historical Provider, MD  Polyethyl Glycol-Propyl Glycol (SYSTANE OP) Apply 1-2 drops to eye 3 (three) times daily as needed (dry eyes).   Yes Historical Provider, MD  Cholecalciferol (VITAMIN D3) 5000 UNITS TABS Take 1 tablet by mouth daily.    Historical Provider, MD   BP 131/84 mmHg  Pulse 71  Temp(Src) 97.8 F (36.6 C) (Oral)  Resp 19  SpO2 100% Physical Exam  Constitutional: She is oriented to person, place, and time. She appears well-developed and well-nourished. No distress.  HENT:  Head: Normocephalic and atraumatic.  Mouth/Throat: Oropharynx is clear and moist.  Eyes: Pupils are equal, round, and  reactive to light.  Neck: Normal range of motion. Neck supple.  Cardiovascular: Normal rate, regular rhythm and normal heart sounds.  Exam reveals no gallop and no friction rub.   No murmur heard. Pulmonary/Chest: Effort normal and breath sounds normal. No respiratory distress. She has no wheezes.  Abdominal: Soft. Bowel sounds are normal. She exhibits no distension. There is no tenderness.  Neurological: She is alert and oriented to person, place, and time. She exhibits normal muscle tone. Coordination normal.  Skin: Skin is warm and dry. No rash noted. No  erythema.  Psychiatric: She has a normal mood and affect. Her behavior is normal.  Nursing note and vitals reviewed.   ED Course  Procedures (including critical care time) Labs Review Labs Reviewed  BASIC METABOLIC PANEL - Abnormal; Notable for the following:    Glucose, Bld 106 (*)    All other components within normal limits  CBC  I-STAT TROPOININ, ED    Imaging Review Dg Chest 2 View  08/19/2015  CLINICAL DATA:  Shortness of breath and heart palpitations EXAM: CHEST  2 VIEW COMPARISON:  09/24/2007 chest CT FINDINGS: Normal heart size and mediastinal contours. No acute infiltrate or edema. No effusion or pneumothorax. No acute osseous findings. IMPRESSION: No evidence acute cardiopulmonary disease. Electronically Signed   By: Monte Fantasia M.D.   On: 08/19/2015 10:34   Ct Angio Chest Pe W/cm &/or Wo Cm  08/19/2015  CLINICAL DATA:  Shortness of breath. History previous pulmonary embolism EXAM: CT ANGIOGRAPHY CHEST WITH CONTRAST TECHNIQUE: Multidetector CT imaging of the chest was performed using the standard protocol during bolus administration of intravenous contrast. Multiplanar CT image reconstructions and MIPs were obtained to evaluate the vascular anatomy. CONTRAST:  21mL OMNIPAQUE IOHEXOL 350 MG/ML SOLN COMPARISON:  09/24/2007 FINDINGS: THORACIC INLET/BODY WALL: No acute abnormality. MEDIASTINUM: Normal heart size. No pericardial effusion. No evidence of pulmonary embolism. No aortic aneurysm or dissection. No adenopathy. LUNG WINDOWS: There is no edema, consolidation, effusion, or pneumothorax. UPPER ABDOMEN: Partially visualized right nephrolithiasis.  No acute finding OSSEOUS: No acute fracture.  No suspicious lytic or blastic lesions. Review of the MIP images confirms the above findings. IMPRESSION: 1. No evidence of pulmonary embolism or other acute finding. 2. Right nephrolithiasis. Electronically Signed   By: Monte Fantasia M.D.   On: 08/19/2015 11:54   I have personally  reviewed and evaluated these images and lab results as part of my medical decision-making.   EKG Interpretation None      The patient has been totally stable here in the emergency department.  We walked her around the department and her heart rate did not get above 85.  Her pulse oximetry was normal as well.  The patient has been stable.  Patient is advised return here as needed.  Patient agrees the plan and all questions were answered.  Did advise her to follow-up with Dr. Verl Blalock her cardiologist for recheck    Dalia Heading, PA-C 08/19/15 1555  Courteney Lyn Mackuen, MD 08/19/15 1556

## 2015-08-19 NOTE — ED Notes (Signed)
Bed: RESA Expected date:  Expected time:  Means of arrival:  Comments: 

## 2015-08-19 NOTE — Discharge Instructions (Signed)
Return here as needed.  Follow-up with your primary doctor. Rest as much as possible.

## 2015-08-20 ENCOUNTER — Ambulatory Visit: Payer: 59

## 2015-08-30 ENCOUNTER — Ambulatory Visit: Payer: 59 | Admitting: Cardiology

## 2015-09-12 DIAGNOSIS — N879 Dysplasia of cervix uteri, unspecified: Secondary | ICD-10-CM

## 2015-09-12 HISTORY — DX: Dysplasia of cervix uteri, unspecified: N87.9

## 2015-09-14 ENCOUNTER — Ambulatory Visit: Payer: 59 | Admitting: Cardiology

## 2015-09-17 ENCOUNTER — Ambulatory Visit: Payer: 59 | Admitting: Obstetrics and Gynecology

## 2015-09-21 ENCOUNTER — Ambulatory Visit (INDEPENDENT_AMBULATORY_CARE_PROVIDER_SITE_OTHER): Payer: 59 | Admitting: Cardiology

## 2015-09-21 ENCOUNTER — Encounter: Payer: Self-pay | Admitting: Cardiology

## 2015-09-21 ENCOUNTER — Ambulatory Visit: Admit: 2015-09-21 | Discharge: 2015-09-21 | Disposition: A | Discharge status: Home / Self Care | Payer: 59

## 2015-09-21 VITALS — BP 116/78 | HR 64 | Ht 65.0 in | Wt 164.0 lb

## 2015-09-21 DIAGNOSIS — Z1231 Encounter for screening mammogram for malignant neoplasm of breast: Secondary | ICD-10-CM

## 2015-09-21 DIAGNOSIS — R002 Palpitations: Secondary | ICD-10-CM

## 2015-09-21 NOTE — Patient Instructions (Signed)
Medication Instructions:  Your physician recommends that you continue on your current medications as directed. Please refer to the Current Medication list given to you today.   Labwork: I WILL REQUEST LAB WORK FROM DR. VYAS  Testing/Procedures: NONE  Follow-Up: Your physician recommends that you schedule a follow-up appointment in: 3 MONTHS WIT DR. BRANCH   Any Other Special Instructions Will Be Listed Below (If Applicable).  IF PALPITATIONS CONTINUE, CALL AND LET us KNOW SO THAT WE CAN SET YOU UP TO WEAR AN EVENT MONITOR.   DECREASE CAFFEINE      If you need a refill on your cardiac medications before your next appointment, please call your pharmacy.  Thanks for choosing [facility]!!!

## 2015-09-21 NOTE — Progress Notes (Signed)
Patient ID: Tanda Rockers, female   DOB: 01-14-1971, 45 y.o.   MRN: [id number]     Clinical Summary Ms. Perezmartinez is a 45 y.o.female seen today as a new patient for the following medical problems.   1. SOB - seen in ER 08/19/15 with episode of palpitations, dizziness, and SOB - K 4, no TSH or Mg resulted. EKG normal sinus rhythm. 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 Feeling of heart racing, could actually palpate her heart beat. +SOB, no lightheadness or dizziness. Denies any prior episodes - no coffee, iced tea x 2-3 glasses, sodas 1-3 per day typically diet pepsi, no EtOH - denies any SOB or DOE - mild episodes since that day, shorter in duration.  2. History of PE - occurred after previous pregnancy   Past Medical History  Diagnosis Date  . Personal history of venous thrombosis and embolism   . Osteopenia 08/2014  . Abnormal Pap smear of cervix 06/2015    ASCUS      Allergies  Allergen Reactions  . Sulfonamide Derivatives     REACTION: fever     Current Outpatient Prescriptions  Medication Sig Dispense Refill  . Calcium-Magnesium-Vitamin D (CALCIUM 1200+D3) 600-40-500 MG-MG-UNIT TB24 Take 2 tablets by mouth daily.    . Cholecalciferol (VITAMIN D3) 5000 UNITS TABS Take 1 tablet by mouth daily.    Marland Kitchen ibuprofen (ADVIL,MOTRIN) 200 MG tablet Take 400 mg by mouth 2 (two) times daily as needed for headache or moderate pain.     Marland Kitchen levonorgestrel (MIRENA) 20 MCG/24HR IUD 1 each by Intrauterine route once.    . loratadine (CLARITIN) 10 MG tablet Take 10 mg by mouth daily.    Vladimir Faster Glycol-Propyl Glycol (SYSTANE OP) Apply 1-2 drops to eye 3 (three) times daily as needed (dry eyes).     No current facility-administered medications for this visit.     Past Surgical History  Procedure Laterality Date  . Wisdom teeth extracted    . C-sections  2002, 2008  . Dilation and curettage of uterus  1997     Allergies    Allergen Reactions  . Sulfonamide Derivatives     REACTION: fever      Family History  Problem Relation Age of Onset  . Coronary artery disease Father   . Hypertension Father   . Cancer Father 49    prostate CA  . Coronary artery disease Paternal Grandmother   . Diabetes Paternal Grandmother   . Hypertension Paternal Grandmother   . Breast cancer Maternal Grandmother      Social History Ms. Golberg reports that she has never smoked. She has never used smokeless tobacco. Ms. Asti reports that she does not drink alcohol.   Review of Systems CONSTITUTIONAL: No weight loss, fever, chills, weakness or fatigue.  HEENT: Eyes: No visual loss, blurred vision, double vision or yellow sclerae.No hearing loss, sneezing, congestion, runny nose or sore throat.  SKIN: No rash or itching.  CARDIOVASCULAR: per HPI RESPIRATORY: No shortness of breath, cough or sputum.  GASTROINTESTINAL: No anorexia, nausea, vomiting or diarrhea. No abdominal pain or blood.  GENITOURINARY: No burning on urination, no polyuria NEUROLOGICAL: No headache, dizziness, syncope, paralysis, ataxia, numbness or tingling in the extremities. No change in bowel or bladder control.  MUSCULOSKELETAL: No muscle, back pain, joint pain or stiffness.  LYMPHATICS: No enlarged nodes. No history of splenectomy.  PSYCHIATRIC: No history of depression or anxiety.  ENDOCRINOLOGIC: No reports  of sweating, cold or heat intolerance. No polyuria or polydipsia.  Marland Kitchen   Physical Examination Filed Vitals:   09/21/15 0924  BP: 116/78  Pulse: 64   Filed Vitals:   09/21/15 0924  Height: 5\' 5"  (1.651 m)  Weight: 164 lb (74.39 kg)    Gen: resting comfortably, no acute distress HEENT: no scleral icterus, pupils equal round and reactive, no palptable cervical adenopathy,  CV: RRR, no m/r/g, no jvd Resp: Clear to auscultation bilaterally GI: abdomen is soft, non-tender, non-distended, normal bowel sounds, no  hepatosplenomegaly MSK: extremities are warm, no edema.  Skin: warm, no rash Neuro:  no focal deficits Psych: appropriate affect   Diagnostic Studies ER labs, imaging, and EKG reviewed today in clinic    Assessment and Plan  1. Palpitations - most likely benign ectopy or episodes of PSVT in this young otherwise healthy patient. We will follow symptoms as she weans caffeine intake, if persist would consider event monitor at that time - request pcp labs to see if she had a Mg or TSH checked recently   F/u 3 months       Arnoldo Lenis, M.D.

## 2015-10-05 DIAGNOSIS — R5383 Other fatigue: Secondary | ICD-10-CM | POA: Diagnosis not present

## 2015-10-05 DIAGNOSIS — E7800 Pure hypercholesterolemia, unspecified: Secondary | ICD-10-CM | POA: Diagnosis not present

## 2015-10-05 DIAGNOSIS — Z0000 Encounter for general adult medical examination without abnormal findings: Secondary | ICD-10-CM | POA: Diagnosis not present

## 2015-10-18 DIAGNOSIS — Z0000 Encounter for general adult medical examination without abnormal findings: Secondary | ICD-10-CM | POA: Diagnosis not present

## 2015-10-18 DIAGNOSIS — E559 Vitamin D deficiency, unspecified: Secondary | ICD-10-CM | POA: Diagnosis not present

## 2015-10-25 ENCOUNTER — Encounter: Payer: Self-pay | Admitting: Cardiology

## 2015-11-22 DIAGNOSIS — H16223 Keratoconjunctivitis sicca, not specified as Sjogren's, bilateral: Secondary | ICD-10-CM | POA: Diagnosis not present

## 2015-12-21 ENCOUNTER — Ambulatory Visit: Payer: 59 | Admitting: Cardiology

## 2015-12-23 ENCOUNTER — Encounter: Payer: Self-pay | Admitting: Cardiology

## 2015-12-23 ENCOUNTER — Ambulatory Visit (INDEPENDENT_AMBULATORY_CARE_PROVIDER_SITE_OTHER): Payer: 59 | Admitting: Cardiology

## 2015-12-23 VITALS — BP 114/70 | HR 63 | Ht 65.0 in | Wt 160.0 lb

## 2015-12-23 DIAGNOSIS — R002 Palpitations: Secondary | ICD-10-CM

## 2015-12-23 NOTE — Progress Notes (Signed)
Patient ID: Judy Walter, female   DOB: 1971-04-15, 45 y.o.   MRN: [id number]     Clinical Summary Judy Walter is a 45 y.o.female seen today for follow up of the following medical problems.   1. SOB - seen in ER 08/19/15 with episode of palpitations, dizziness, and SOB - K 4, no TSH or Mg resulted. EKG normal sinus rhythm. 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 Feeling of heart racing, could actually palpate her heart beat. +SOB, no lightheadness or dizziness. Denies any prior episodes - no coffee, iced tea x 2-3 glasses, sodas 1-3 per day typically diet pepsi, no EtOH   - reports she has cut back on caffeine since last visit, no recurrent palpitations since that time.     SH: heading to [facility] in April with her family and kids.  Past Medical History  Diagnosis Date  . Personal history of venous thrombosis and embolism   . Osteopenia 08/2014  . Abnormal Pap smear of cervix 06/2015    ASCUS      Allergies  Allergen Reactions  . Sulfonamide Derivatives     REACTION: fever     Current Outpatient Prescriptions  Medication Sig Dispense Refill  . Calcium-Magnesium-Vitamin D (CALCIUM 1200+D3) 600-40-500 MG-MG-UNIT TB24 Take 2 tablets by mouth daily.    . Cholecalciferol (VITAMIN D3) 5000 UNITS TABS Take 1 tablet by mouth daily.    Marland Kitchen ibuprofen (ADVIL,MOTRIN) 200 MG tablet Take 400 mg by mouth 2 (two) times daily as needed for headache or moderate pain.     Marland Kitchen levonorgestrel (MIRENA) 20 MCG/24HR IUD 1 each by Intrauterine route once.    . loratadine (CLARITIN) 10 MG tablet Take 10 mg by mouth daily.    Judy Walter Glycol-Propyl Glycol (SYSTANE OP) Apply 1-2 drops to eye 3 (three) times daily as needed (dry eyes).     No current facility-administered medications for this visit.     Past Surgical History  Procedure Laterality Date  . Wisdom teeth extracted    . C-sections  2002, 2008  . Dilation and curettage of uterus   1997     Allergies  Allergen Reactions  . Sulfonamide Derivatives     REACTION: fever      Family History  Problem Relation Age of Onset  . Coronary artery disease Father   . Hypertension Father   . Cancer Father 29    prostate CA  . Coronary artery disease Paternal Grandmother   . Diabetes Paternal Grandmother   . Hypertension Paternal Grandmother   . Breast cancer Maternal Grandmother      Social History Judy Walter reports that she has never smoked. She has never used smokeless tobacco. Judy Walter reports that she does not drink alcohol.   Review of Systems CONSTITUTIONAL: No weight loss, fever, chills, weakness or fatigue.  HEENT: Eyes: No visual loss, blurred vision, double vision or yellow sclerae.No hearing loss, sneezing, congestion, runny nose or sore throat.  SKIN: No rash or itching.  CARDIOVASCULAR: per HPI RESPIRATORY: No shortness of breath, cough or sputum.  GASTROINTESTINAL: No anorexia, nausea, vomiting or diarrhea. No abdominal pain or blood.  GENITOURINARY: No burning on urination, no polyuria NEUROLOGICAL: No headache, dizziness, syncope, paralysis, ataxia, numbness or tingling in the extremities. No change in bowel or bladder control.  MUSCULOSKELETAL: No muscle, back pain, joint pain or stiffness.  LYMPHATICS: No enlarged nodes. No history of splenectomy.  PSYCHIATRIC: No history of  depression or anxiety.  ENDOCRINOLOGIC: No reports of sweating, cold or heat intolerance. No polyuria or polydipsia.  Marland Kitchen   Physical Examination Filed Vitals:   12/23/15 0825  BP: 114/70  Pulse: 63   Filed Vitals:   12/23/15 0825  Height: 5\' 5"  (1.651 m)  Weight: 160 lb (72.576 kg)    Gen: resting comfortably, no acute distress HEENT: no scleral icterus, pupils equal round and reactive, no palptable cervical adenopathy,  CV: RRR, no m/r/g no jvd Resp: Clear to auscultation bilaterally GI: abdomen is soft, non-tender, non-distended, normal bowel  sounds, no hepatosplenomegaly MSK: extremities are warm, no edema.  Skin: warm, no rash Neuro:  no focal deficits Psych: appropriate affect        Assessment and Plan  1. Palpitations - resolved with decreased caffeine intake, no further workup at this time. Continue to monitor   F/u as needed       Arnoldo Lenis, M.D

## 2015-12-23 NOTE — Patient Instructions (Signed)
Your physician recommends that you schedule a follow-up appointment in: as needed   Thank you for choosing [facility] !         

## 2016-01-10 DIAGNOSIS — Z789 Other specified health status: Secondary | ICD-10-CM | POA: Diagnosis not present

## 2016-01-10 DIAGNOSIS — J019 Acute sinusitis, unspecified: Secondary | ICD-10-CM | POA: Diagnosis not present

## 2016-03-06 DIAGNOSIS — H16223 Keratoconjunctivitis sicca, not specified as Sjogren's, bilateral: Secondary | ICD-10-CM | POA: Diagnosis not present

## 2016-03-06 DIAGNOSIS — H52223 Regular astigmatism, bilateral: Secondary | ICD-10-CM | POA: Diagnosis not present

## 2016-03-06 DIAGNOSIS — H5212 Myopia, left eye: Secondary | ICD-10-CM | POA: Diagnosis not present

## 2016-03-06 DIAGNOSIS — H524 Presbyopia: Secondary | ICD-10-CM | POA: Diagnosis not present

## 2016-06-23 ENCOUNTER — Ambulatory Visit: Payer: 59 | Admitting: Obstetrics and Gynecology

## 2016-06-27 DIAGNOSIS — J309 Allergic rhinitis, unspecified: Secondary | ICD-10-CM | POA: Diagnosis not present

## 2016-06-27 DIAGNOSIS — Z6826 Body mass index (BMI) 26.0-26.9, adult: Secondary | ICD-10-CM | POA: Diagnosis not present

## 2016-06-27 DIAGNOSIS — M545 Low back pain: Secondary | ICD-10-CM | POA: Diagnosis not present

## 2016-06-27 DIAGNOSIS — Z713 Dietary counseling and surveillance: Secondary | ICD-10-CM | POA: Diagnosis not present

## 2016-06-30 ENCOUNTER — Ambulatory Visit (INDEPENDENT_AMBULATORY_CARE_PROVIDER_SITE_OTHER): Payer: 59 | Admitting: Obstetrics and Gynecology

## 2016-06-30 ENCOUNTER — Encounter: Payer: Self-pay | Admitting: Obstetrics and Gynecology

## 2016-06-30 VITALS — BP 122/80 | HR 60 | Resp 16 | Ht 64.5 in | Wt 160.8 lb

## 2016-06-30 DIAGNOSIS — Z0000 Encounter for general adult medical examination without abnormal findings: Secondary | ICD-10-CM

## 2016-06-30 DIAGNOSIS — Z01419 Encounter for gynecological examination (general) (routine) without abnormal findings: Secondary | ICD-10-CM

## 2016-06-30 DIAGNOSIS — R87610 Atypical squamous cells of undetermined significance on cytologic smear of cervix (ASC-US): Secondary | ICD-10-CM | POA: Diagnosis not present

## 2016-06-30 LAB — POCT URINALYSIS DIPSTICK
BILIRUBIN UA: NEGATIVE
Blood, UA: NEGATIVE
Glucose, UA: NEGATIVE
KETONES UA: NEGATIVE
Leukocytes, UA: NEGATIVE
Nitrite, UA: NEGATIVE
PH UA: 5
PROTEIN UA: NEGATIVE
Urobilinogen, UA: NEGATIVE

## 2016-06-30 NOTE — Patient Instructions (Signed)

## 2016-06-30 NOTE — Progress Notes (Signed)
45 y.o. G15P0010 Married Caucasian female here for annual exam.    Having right back pain.  This followed cleaning at home.  Taking Baclofen.   Having vaginal spotting.  This was post coital only one month ago.  This is random.  Can also have spotting for a day or two 1.5 years ago.   Father with prostate cancer.   Is working on her bachelor's degree.   Daughter has left home and they do not have a lot of contact.  She is at peace with this.   PCP:  Jerene Bears, MD   No LMP recorded. Patient is not currently having periods (Reason: IUD).           Sexually active: Yes.   female The current method of family planning is IUD--Mirena inserted 11-10-13.    Exercising: No.   Smoker:  no  Health Maintenance: Pap:  06-07-15 ASCUS:Neg HR HPV History of abnormal Pap:  Yes, 06-07-15 ASCUS:Neg HR HPV with colposcopy revealing LGSIL of exocervix and Neg ECC. 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 She has never used smokeless tobacco. She reports that she does not drink alcohol or use drugs.  Past Medical History:  Diagnosis Date  . Abnormal Pap smear of cervix 06/2015   ASCUS   . Osteopenia 08/2014  . Personal history of venous thrombosis and embolism     Past Surgical History:  Procedure Laterality Date  . C-sections  2002, 2008  . DILATION AND CURETTAGE OF UTERUS  1997  . wisdom teeth extracted      Current Outpatient Prescriptions  Medication Sig Dispense Refill  . baclofen (LIORESAL) 10 MG tablet Take 10 mg by mouth as needed for muscle spasms.    . Calcium-Magnesium-Vitamin D (CALCIUM 1200+D3) 600-40-500 MG-MG-UNIT TB24 Take 2 tablets by mouth daily.    . cetirizine (ZYRTEC) 10 MG tablet Take 10 mg by mouth daily.    .  Cholecalciferol (VITAMIN D3) 5000 UNITS TABS Take 1 tablet by mouth daily.    . cycloSPORINE (RESTASIS) 0.05 % ophthalmic emulsion Place 1 drop into both eyes 2 (two) times daily.    Marland Kitchen ibuprofen (ADVIL,MOTRIN) 200 MG tablet Take 400 mg by mouth 2 (two) times daily as needed for headache or moderate pain.     Marland Kitchen levonorgestrel (MIRENA) 20 MCG/24HR IUD 1 each by Intrauterine route once.    Vladimir Faster Glycol-Propyl Glycol (SYSTANE OP) Apply 1-2 drops to eye 3 (three) times daily as needed (dry eyes).    . predniSONE (DELTASONE) 10 MG tablet Take 10 mg by mouth 2 (two) times daily.     No current facility-administered medications for this visit.     Family History  Problem Relation Age of Onset  . Coronary artery disease Father   . Hypertension Father   . Hemochromatosis Father   . Cancer Father     prostate  . Coronary artery disease Paternal Grandmother   . Diabetes Paternal Grandmother   . Hypertension Paternal Grandmother   . Breast cancer Maternal Grandmother   . Osteoporosis Maternal Grandmother   . Alzheimer's disease Maternal Grandmother     ROS:  Pertinent items are noted in HPI.  Otherwise, a comprehensive ROS  was negative.  Exam:   BP 122/80 (BP Location: Right Arm, Patient Position: Sitting, Cuff Size: Normal)   Pulse 60   Resp 16   Ht 5' 4.5" (1.638 m)   Wt 160 lb 12.8 oz (72.9 kg)   BMI 27.17 kg/m     General appearance: alert, cooperative and appears stated age Head: Normocephalic, without obvious abnormality, atraumatic Neck: no adenopathy, supple, symmetrical, trachea midline and thyroid normal to inspection and palpation Lungs: clear to auscultation bilaterally Breasts: normal appearance, no masses or tenderness, No nipple retraction or dimpling, No nipple discharge or bleeding, No axillary or supraclavicular adenopathy Heart: regular rate and rhythm Abdomen: soft, non-tender; no masses, no organomegaly Extremities: extremities normal, atraumatic, no cyanosis  or edema Skin: Skin color, texture, turgor normal. No rashes or lesions Lymph nodes: Cervical, supraclavicular, and axillary nodes normal. No abnormal inguinal nodes palpated Neurologic: Grossly normal  Pelvic: External genitalia:  no lesions              Urethra:  normal appearing urethra with no masses, tenderness or lesions              Bartholins and Skenes: normal                 Vagina: normal appearing vagina with normal color and discharge, no lesions              Cervix: no lesions              Pap taken: Yes.   Bimanual Exam:  Uterus:  normal size, contour, position, consistency, mobility, non-tender              Adnexa: no mass, fullness, tenderness              Rectal exam: Yes.  .  Confirms.              Anus:  normal sphincter tone, no lesions  Chaperone was present for exam.  Assessment:   Well woman visit with normal exam. Mirena IUD.  LGSIL.  Osteopenia. Hx PE.  Mild pelvic organ prolapse and stress incontinence.  Plan: Yearly mammogram recommended after age 15.  Recommended self breast exam.  Pap and HR HPV as above. Guidelines for  Calcium, Vitamin D, regular exercise program including cardiovascular and weight bearing exercise. Kegels.  Labs with PCP.  Follow up annually and prn.       After visit summary provided.

## 2016-07-05 LAB — IPS PAP TEST WITH HPV

## 2016-07-06 ENCOUNTER — Telehealth: Payer: Self-pay

## 2016-07-06 DIAGNOSIS — R87610 Atypical squamous cells of undetermined significance on cytologic smear of cervix (ASC-US): Secondary | ICD-10-CM

## 2016-07-06 NOTE — Telephone Encounter (Signed)
Patient returned your call.

## 2016-07-06 NOTE — Telephone Encounter (Signed)
Left message to call Varnell Donate at [phone]. 

## 2016-07-06 NOTE — Telephone Encounter (Signed)
-----   Message from Nunzio Cobbs, MD sent at 07/05/2016  9:58 PM EDT ----- Please report pap results showing ASCUS and negative HR HPV. She needs a colposcopy again with me.  Last year her colposcopy showed LGSIL.   Cc- Marisa Sprinkles

## 2016-07-06 NOTE — Telephone Encounter (Signed)
Spoke with patient. Advised of results as seen below from Judy Walter. Patient is agreeable and verbalizes understanding. Has a Mirena IUD in place that was placed 11/10/2013. Patient is currently in school and working. Needs a Friday morning appointment. Appointment scheduled for 08/18/2016 at 10 am with Dr.Silva. Patient is agreeable to date and time.   Instructions given. Motrin 800 mg po x , one hour before appointment with food. Make sure to eat a meal before appointment and drink plenty of fluids. Patient verbalized understanding and will call to reschedule if will be on menses. Patient agreeable and verbalized understanding of all instructions. Order placed for precert.  Routing to provider for final review. Patient agreeable to disposition. Will close encounter.

## 2016-07-28 ENCOUNTER — Telehealth: Payer: Self-pay | Admitting: Obstetrics and Gynecology

## 2016-07-28 NOTE — Telephone Encounter (Signed)
Called patient to review benefits for procedure. Left voicemail to call back and review. °

## 2016-08-18 ENCOUNTER — Encounter: Payer: Self-pay | Admitting: Obstetrics and Gynecology

## 2016-08-18 ENCOUNTER — Ambulatory Visit (INDEPENDENT_AMBULATORY_CARE_PROVIDER_SITE_OTHER): Payer: 59 | Admitting: Obstetrics and Gynecology

## 2016-08-18 DIAGNOSIS — R87610 Atypical squamous cells of undetermined significance on cytologic smear of cervix (ASC-US): Secondary | ICD-10-CM

## 2016-08-18 DIAGNOSIS — N870 Mild cervical dysplasia: Secondary | ICD-10-CM | POA: Diagnosis not present

## 2016-08-18 NOTE — Progress Notes (Deleted)
Patient ID: [name], female   DOB: 1970/10/28, 45 y.o.   MRN: [id number]  Chief Complaint  Patient presents with  . Procedure    Colpo/tm    HPI Judy Walter is a 45 y.o. female.  *** HPI  Patient is here for colposcopy. Pap smear 06/30/16 showing ASCUS and negative HR HPV. Last year her colposcopy showed LGSIL.  LMP: Mirena Contraception: IUD  Indications: Pap smear on {MONTH:10108} 20*** showed: {Findings; lab pap smear results:16707::"no abnormalities"}. Previous colposcopy: {Exam; colposcopy summary:733::"in ***"}. Prior cervical treatment: {Therapies; recommendations colposcopy:727}.  Past Medical History:  Diagnosis Date  . Abnormal Pap smear of cervix 06/2015   ASCUS   . Osteopenia 08/2014  . Personal history of venous thrombosis and embolism     Past Surgical History:  Procedure Laterality Date  . C-sections  2002, 2008  . DILATION AND CURETTAGE OF UTERUS  1997  . wisdom teeth extracted      Family History  Problem Relation Age of Onset  . Coronary artery disease Father   . Hypertension Father   . Hemochromatosis Father   . Cancer Father     prostate  . Coronary artery disease Paternal Grandmother   . Diabetes Paternal Grandmother   . Hypertension Paternal Grandmother   . Breast cancer Maternal Grandmother   . Osteoporosis Maternal Grandmother   . Alzheimer's disease Maternal Grandmother     Social History Social History  Substance Use Topics  . Smoking status: Never Smoker  . Smokeless tobacco: Never Used  . Alcohol use No     Comment: rare wine    Allergies  Allergen Reactions  . Sulfonamide Derivatives     REACTION: fever    Current Outpatient Prescriptions  Medication Sig Dispense Refill  . Calcium-Magnesium-Vitamin D (CALCIUM 1200+D3) 600-40-500 MG-MG-UNIT TB24 Take 2 tablets by mouth daily.    . cetirizine (ZYRTEC) 10 MG tablet Take 10 mg by mouth daily.    . Cholecalciferol (VITAMIN D3) 5000 UNITS TABS Take 1 tablet by  mouth daily.    . cycloSPORINE (RESTASIS) 0.05 % ophthalmic emulsion Place 1 drop into both eyes 2 (two) times daily.    Marland Kitchen ibuprofen (ADVIL,MOTRIN) 200 MG tablet Take 400 mg by mouth 2 (two) times daily as needed for headache or moderate pain.     Marland Kitchen levonorgestrel (MIRENA) 20 MCG/24HR IUD 1 each by Intrauterine route once.    Vladimir Faster Glycol-Propyl Glycol (SYSTANE OP) Apply 1-2 drops to eye 3 (three) times daily as needed (dry eyes).     No current facility-administered medications for this visit.     Review of Systems Review of Systems  Blood pressure 118/70, pulse 68, resp. rate 16, height 5' 4.5" (1.638 m), weight 160 lb (72.6 kg).  Physical Exam Physical Exam  Data Reviewed ***  Assessment    Procedure Details  The risks and benefits of the procedure and Written informed consent obtained.  Speculum placed in vagina and excellent visualization of cervix achieved, cervix swabbed x 3 with acetic acid solution.  Specimens: ***  Complications: none.     Plan    Specimens labelled and sent to Pathology. Return to discuss Pathology results in 2 weeks.      Archie Balboa 08/18/2016, 9:44 AM

## 2016-08-18 NOTE — Patient Instructions (Signed)

## 2016-08-18 NOTE — Progress Notes (Signed)
Subjective:     Patient ID: Judy Walter., female   DOB: 1971-07-17, 45 y.o.   MRN: [id number]  HPI Patient is here for colposcopy. Pap smear 06/30/16 showing ASCUS and negative HR HPV. 06-07-15 ASCUS:Neg HR HPV with colposcopy revealing LGSIL of exocervix and Neg ECC. 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  Speculum placed. 3% acetic acid used.  Satisfactory colpo and IUD strings noted.  White and green filter light used. ECC taken and sent to path.  Ulceration at 6:00.  Biopsy done and sent to path.  Erythema at 8:00.  Biopsy done and sent to path.  Monsel's placed.  Minimal EBL.  No complications.      Assessment:     ASCUS and negative HR HPV.  Hx LGSIL.     Plan:     Follow up biopsies.  Anticipated cotesting in one year.    After visit summary to patient.

## 2016-08-22 LAB — IPS OTHER TISSUE BIOPSY

## 2016-08-23 ENCOUNTER — Encounter: Payer: Self-pay | Admitting: Obstetrics and Gynecology

## 2016-09-12 ENCOUNTER — Other Ambulatory Visit: Payer: Self-pay | Admitting: Obstetrics and Gynecology

## 2016-09-12 DIAGNOSIS — Z299 Encounter for prophylactic measures, unspecified: Secondary | ICD-10-CM | POA: Diagnosis not present

## 2016-09-12 DIAGNOSIS — Z789 Other specified health status: Secondary | ICD-10-CM | POA: Diagnosis not present

## 2016-09-12 DIAGNOSIS — Z1231 Encounter for screening mammogram for malignant neoplasm of breast: Secondary | ICD-10-CM

## 2016-09-12 DIAGNOSIS — J019 Acute sinusitis, unspecified: Secondary | ICD-10-CM | POA: Diagnosis not present

## 2016-10-04 DIAGNOSIS — R05 Cough: Secondary | ICD-10-CM | POA: Diagnosis not present

## 2016-10-04 DIAGNOSIS — J310 Chronic rhinitis: Secondary | ICD-10-CM | POA: Diagnosis not present

## 2016-10-04 DIAGNOSIS — J3089 Other allergic rhinitis: Secondary | ICD-10-CM | POA: Diagnosis not present

## 2016-10-06 ENCOUNTER — Ambulatory Visit: Payer: 59

## 2016-10-09 ENCOUNTER — Ambulatory Visit: Payer: 59

## 2016-10-09 DIAGNOSIS — M8580 Other specified disorders of bone density and structure, unspecified site: Secondary | ICD-10-CM | POA: Diagnosis not present

## 2016-10-09 DIAGNOSIS — Z1389 Encounter for screening for other disorder: Secondary | ICD-10-CM | POA: Diagnosis not present

## 2016-10-09 DIAGNOSIS — Z418 Encounter for other procedures for purposes other than remedying health state: Secondary | ICD-10-CM | POA: Diagnosis not present

## 2016-10-09 DIAGNOSIS — E663 Overweight: Secondary | ICD-10-CM | POA: Diagnosis not present

## 2016-10-09 DIAGNOSIS — R5383 Other fatigue: Secondary | ICD-10-CM | POA: Diagnosis not present

## 2016-10-09 DIAGNOSIS — Z0000 Encounter for general adult medical examination without abnormal findings: Secondary | ICD-10-CM | POA: Diagnosis not present

## 2016-10-09 DIAGNOSIS — Z299 Encounter for prophylactic measures, unspecified: Secondary | ICD-10-CM | POA: Diagnosis not present

## 2016-10-09 DIAGNOSIS — Z713 Dietary counseling and surveillance: Secondary | ICD-10-CM | POA: Diagnosis not present

## 2016-10-09 DIAGNOSIS — E7800 Pure hypercholesterolemia, unspecified: Secondary | ICD-10-CM | POA: Diagnosis not present

## 2016-10-09 DIAGNOSIS — E559 Vitamin D deficiency, unspecified: Secondary | ICD-10-CM | POA: Diagnosis not present

## 2016-11-03 ENCOUNTER — Ambulatory Visit: Payer: 59

## 2016-11-16 DIAGNOSIS — E559 Vitamin D deficiency, unspecified: Secondary | ICD-10-CM | POA: Diagnosis not present

## 2016-11-16 DIAGNOSIS — M8580 Other specified disorders of bone density and structure, unspecified site: Secondary | ICD-10-CM | POA: Diagnosis not present

## 2016-12-01 DIAGNOSIS — Z0000 Encounter for general adult medical examination without abnormal findings: Secondary | ICD-10-CM | POA: Diagnosis not present

## 2016-12-15 ENCOUNTER — Ambulatory Visit: Payer: 59

## 2017-01-05 ENCOUNTER — Ambulatory Visit
Admission: RE | Admit: 2017-01-05 | Discharge: 2017-01-05 | Disposition: A | Discharge status: Home / Self Care | Payer: 59 | Source: Ambulatory Visit | Attending: Obstetrics and Gynecology | Admitting: Obstetrics and Gynecology

## 2017-01-05 DIAGNOSIS — Z1231 Encounter for screening mammogram for malignant neoplasm of breast: Secondary | ICD-10-CM

## 2017-05-15 ENCOUNTER — Telehealth: Payer: Self-pay | Admitting: Obstetrics and Gynecology

## 2017-05-15 NOTE — Telephone Encounter (Signed)
Spoke with patient. Patient had a Mirena placed 11-10-2013. 1 month ago patient began having light bleeding that lasted for 5 days. Yesterday she began having light bleeding again. Is having mild cramping. Denies any heavy bleeding, sharp pain, fever, or chills. Has never been able to feel IUD strings. Patient is concerned as she reports never having bleeding with her IUD. States she has had light BTB before, but never bleeding every month like a menses. Is also feeling increasingly irritable. Advised having a menses with an IUD can occur. Patient is worried as this is not her normal. Advised will require with covering MD and return call. Patient is agreeable.  Dr.Jertson, please advise, IUD check with U/S?

## 2017-05-15 NOTE — Telephone Encounter (Signed)
I would recommend that she come in for a visit and exam and then make a decision about the need for an ultrasound.

## 2017-05-15 NOTE — Telephone Encounter (Signed)
Patient called and said she currently has a Mirena and she's had some menstrual bleeding starting last month she'd like to speak with the nurse about.  Last seen: 08/18/16

## 2017-05-15 NOTE — Telephone Encounter (Signed)
Spoke with patient. Advised of message as seen below from [employer]. Patient is agreeable. Patient lives out of town. Declines OV until 05/17/2017. States she is able to be in [place] by 9 am, but has an appointment that same day at 1:30 pm with another MD. Appointment scheduled for 05/17/2017 at 10:45 am with Dr.Jertson. Patient is agreeable to date and time. Advised if bleeding becomes heavy or develops and pain will need to be seen for immediate evaluation. Patient is agreeable.  Routing to provider for final review. Patient agreeable to disposition. Will close encounter.

## 2017-05-17 ENCOUNTER — Ambulatory Visit: Payer: 59 | Admitting: Obstetrics and Gynecology

## 2017-05-21 ENCOUNTER — Encounter: Payer: Self-pay | Admitting: Obstetrics and Gynecology

## 2017-05-21 ENCOUNTER — Ambulatory Visit (INDEPENDENT_AMBULATORY_CARE_PROVIDER_SITE_OTHER): Payer: 59 | Admitting: Obstetrics and Gynecology

## 2017-05-21 VITALS — BP 104/70 | HR 60 | Resp 14 | Wt 164.0 lb

## 2017-05-21 DIAGNOSIS — Z975 Presence of (intrauterine) contraceptive device: Secondary | ICD-10-CM | POA: Diagnosis not present

## 2017-05-21 DIAGNOSIS — N921 Excessive and frequent menstruation with irregular cycle: Secondary | ICD-10-CM | POA: Diagnosis not present

## 2017-05-21 DIAGNOSIS — Z30431 Encounter for routine checking of intrauterine contraceptive device: Secondary | ICD-10-CM

## 2017-05-21 LAB — POCT URINE PREGNANCY: Preg Test, Ur: NEGATIVE

## 2017-05-21 NOTE — Progress Notes (Signed)
GYNECOLOGY  VISIT   HPI: 46 y.o.   Married  Caucasian  female   [phone] with Patient's last menstrual period was 05/18/2017.   here for c/o abnormal uterine bleed and cramping with Mirena IUD (placed 3/15). The patient has not had any bleeding with the IUD until the end of July, she then spotted x 5 days. No pain.  Then a month later she started spotting again, lasted a week. Slight clamping for one day last week. Sexually active, no pain with intercourse.  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    GYNECOLOGIC HISTORY: Patient's last menstrual period was 05/18/2017. Contraception:IUD (MIrena)  Menopausal hormone therapy: none         OB History    Gravida Para Term Preterm AB Living   4 3     1 3    SAB TAB Ectopic Multiple Live Births   1                 Patient Active Problem List   Diagnosis Date Noted  . HYPERLIPIDEMIA TYPE I / IV 04/14/2009  . 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 Abnormal Pap smear of cervix 06/2015   ASCUS   . Cervical dysplasia 2017   colposcopic biopsy  . Osteopenia 08/2014  . Personal history of venous thrombosis and embolism     Past Surgical History:  Procedure Laterality Date  . C-sections  2002, 2008  . DILATION AND CURETTAGE OF UTERUS  1997  . wisdom teeth extracted      Current Outpatient Prescriptions  Medication Sig Dispense Refill  . Calcium-Magnesium-Vitamin D (CALCIUM 1200+D3) 600-40-500 MG-MG-UNIT TB24 Take 2 tablets by mouth daily.    . cetirizine (ZYRTEC) 10 MG tablet Take 10 mg by mouth daily.    . Cholecalciferol (VITAMIN D3) 5000 UNITS TABS Take 1 tablet by mouth daily.    . cycloSPORINE (RESTASIS) 0.05 % ophthalmic emulsion Place 1 drop into both eyes 2 (two) times daily.    Marland Kitchen ibuprofen (ADVIL,MOTRIN) 200 MG tablet Take 400 mg by mouth 2 (two) times daily as needed for headache or moderate pain.     Marland Kitchen levonorgestrel (MIRENA) 20 MCG/24HR IUD 1 each by  Intrauterine route once.    Vladimir Faster Glycol-Propyl Glycol (SYSTANE OP) Apply 1-2 drops to eye 3 (three) times daily as needed (dry eyes).     No current facility-administered medications for this visit.      ALLERGIES: Sulfonamide derivatives  Family History  Problem Relation Age of Onset  . Coronary artery disease Father   . Hypertension Father   . Hemochromatosis Father   . Cancer Father        prostate  . Coronary artery disease Paternal Grandmother   . Diabetes Paternal Grandmother   . Hypertension Paternal Grandmother   . Breast cancer Maternal Grandmother   . Osteoporosis Maternal Grandmother   . Alzheimer's disease Maternal Grandmother     Social History   Social History  . Marital status: Married    Spouse name: N/A  . Number of children: N/A  . Years of education: N/A   Occupational History  . Not on file.   Social History Main Topics  . Smoking status: Never Smoker  . Smokeless tobacco: Never Used  . Alcohol use No     Comment: rare wine  . Drug use: No  . Sexual activity: Yes    Partners: Male  Birth control/ protection: IUD     Comment: Mirena inserted 11-10-13    Other Topics Concern  . Not on file   Social History Narrative   Full time...RN...orthopedic floor. Regularly exercises- walks.     Review of Systems  Constitutional: Negative.   HENT: Negative.   Eyes: Negative.   Respiratory: Negative.   Cardiovascular: Negative.   Gastrointestinal: Negative.   Genitourinary:       Abnormal bleeding and cramping  Musculoskeletal: Negative.   Skin: Negative.   Neurological: Negative.   Endo/Heme/Allergies: Negative.   Psychiatric/Behavioral: Negative.     PHYSICAL EXAMINATION:    BP 104/70 (BP Location: Right Arm, Patient Position: Sitting, Cuff Size: Normal)   Pulse 60   Resp 14   Wt 164 lb (74.4 kg)   LMP 05/18/2017   BMI 27.72 kg/m     General appearance: alert, cooperative and appears stated age Neck: no adenopathy, supple,  symmetrical, trachea midline and thyroid normal to inspection and palpation  Pelvic: External genitalia:  no lesions              Urethra:  normal appearing urethra with no masses, tenderness or lesions              Bartholins and Skenes: normal                 Vagina: normal appearing vagina with normal color and discharge, no lesions              Cervix: no cervical motion tenderness, no lesions and IUD string 2-3 cm              Bimanual Exam:  Uterus:  normal size, contour, position, consistency, mobility, non-tender              Adnexa: no mass, fullness, tenderness               Chaperone was present for exam.  ASSESSMENT Recent spotting with the mirena IUD, sounds like 2 short light cycles. Normal exam    PLAN UPT negative Patient reassured  Menstrual calendar given, she will keep track and call with any concerns   An After Visit Summary was printed and given to the patient.  CC: Dr Quincy Simmonds

## 2017-07-20 ENCOUNTER — Ambulatory Visit: Payer: 59 | Admitting: Obstetrics and Gynecology

## 2017-07-27 NOTE — Progress Notes (Deleted)
45 y.o. G26P0013 Married Caucasian female here for annual exam.    PCP:     No LMP recorded. Patient is not currently having periods (Reason: IUD).           Sexually active: {yes no:314532}  The current method of family planning is IUD--Mirena inserted 11-10-13.    Exercising: {yes no:314532}  {types:19826} Smoker:  no  Health Maintenance: Pap: 06-30-16 ASCUS:Neg HR HPV, 06-07-15 ASCUS:Neg HR HPV History of abnormal Pap:  Yes, 06-07-15 ASCUS:Neg HR HPV with colposcopy revealing LGSIL of exocervix and Neg ECC. 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 MMG: 01-05-17 Density B/Neg/BiRads1:TBC Colonoscopy:  n/a BMD: 08/2014  Result :Osteopenia with PCP TDaP:  Up to date through work Gardasil:   no HIV:*** Hep C:*** Screening Labs:  Hb today: ***, Urine today: ***   reports that  has never smoked. she has never used smokeless tobacco. She reports that she does not drink alcohol or use drugs.  Past Medical History:  Diagnosis Date  . Abnormal Pap smear of cervix 06/2015   ASCUS   . Cervical dysplasia 2017   colposcopic biopsy  . Osteopenia 08/2014  . Personal history of venous thrombosis and embolism     Past Surgical History:  Procedure Laterality Date  . C-sections  2002, 2008  . DILATION AND CURETTAGE OF UTERUS  1997  . wisdom teeth extracted      Current Outpatient Medications  Medication Sig Dispense Refill  . Calcium-Magnesium-Vitamin D (CALCIUM 1200+D3) 600-40-500 MG-MG-UNIT TB24 Take 2 tablets by mouth daily.    . cetirizine (ZYRTEC) 10 MG tablet Take 10 mg by mouth daily.    . Cholecalciferol (VITAMIN D3) 5000 UNITS TABS Take 1 tablet by mouth daily.    . cycloSPORINE (RESTASIS) 0.05 % ophthalmic emulsion Place 1 drop into both eyes 2 (two) times daily.    Marland Kitchen ibuprofen (ADVIL,MOTRIN) 200 MG tablet Take 400 mg by mouth 2 (two) times daily as needed for headache or moderate pain.     Marland Kitchen levonorgestrel (MIRENA) 20 MCG/24HR IUD 1 each by Intrauterine route once.    Vladimir Faster  Glycol-Propyl Glycol (SYSTANE OP) Apply 1-2 drops to eye 3 (three) times daily as needed (dry eyes).     No current facility-administered medications for this visit.     Family History  Problem Relation Age of Onset  . Coronary artery disease Father   . Hypertension Father   . Hemochromatosis Father   . Cancer Father        prostate  . Coronary artery disease Paternal Grandmother   . Diabetes Paternal Grandmother   . Hypertension Paternal Grandmother   . Breast cancer Maternal Grandmother   . Osteoporosis Maternal Grandmother   . Alzheimer's disease Maternal Grandmother     ROS:  Pertinent items are noted in HPI.  Otherwise, a comprehensive ROS was negative.  Exam:   There were no vitals taken for this visit.    General appearance: alert, cooperative and appears stated age Head: Normocephalic, without obvious abnormality, atraumatic Neck: no adenopathy, supple, symmetrical, trachea midline and thyroid normal to inspection and palpation Lungs: clear to auscultation bilaterally Breasts: normal appearance, no masses or tenderness, No nipple retraction or dimpling, No nipple discharge or bleeding, No axillary or supraclavicular adenopathy Heart: regular rate and rhythm Abdomen: soft, non-tender; no masses, no organomegaly Extremities: extremities normal, atraumatic, no cyanosis or edema Skin: Skin color, texture, turgor normal. No rashes or lesions Lymph nodes: Cervical, supraclavicular, and axillary nodes normal. No abnormal  inguinal nodes palpated Neurologic: Grossly normal  Pelvic: External genitalia:  no lesions              Urethra:  normal appearing urethra with no masses, tenderness or lesions              Bartholins and Skenes: normal                 Vagina: normal appearing vagina with normal color and discharge, no lesions              Cervix: no lesions              Pap taken: {yes no:314532} Bimanual Exam:  Uterus:  normal size, contour, position, consistency,  mobility, non-tender              Adnexa: no mass, fullness, tenderness              Rectal exam: {yes no:314532}.  Confirms.              Anus:  normal sphincter tone, no lesions  Chaperone was present for exam.  Assessment:   Well woman visit with normal exam.   Plan: Mammogram screening discussed. Recommended self breast awareness. Pap and HR HPV as above. Guidelines for Calcium, Vitamin D, regular exercise program including cardiovascular and weight bearing exercise.   Follow up annually and prn.   Additional counseling given.  {yes [id number]. _______ minutes face to face time of which over 50% was spent in counseling.    After visit summary provided.

## 2017-07-30 ENCOUNTER — Ambulatory Visit: Payer: 59 | Admitting: Obstetrics and Gynecology

## 2017-08-27 ENCOUNTER — Ambulatory Visit: Payer: 59 | Admitting: Obstetrics and Gynecology

## 2017-09-10 DIAGNOSIS — Z789 Other specified health status: Secondary | ICD-10-CM | POA: Diagnosis not present

## 2017-09-10 DIAGNOSIS — Z6828 Body mass index (BMI) 28.0-28.9, adult: Secondary | ICD-10-CM | POA: Diagnosis not present

## 2017-09-10 DIAGNOSIS — Z713 Dietary counseling and surveillance: Secondary | ICD-10-CM | POA: Diagnosis not present

## 2017-09-10 DIAGNOSIS — Z299 Encounter for prophylactic measures, unspecified: Secondary | ICD-10-CM | POA: Diagnosis not present

## 2017-09-10 DIAGNOSIS — J069 Acute upper respiratory infection, unspecified: Secondary | ICD-10-CM | POA: Diagnosis not present

## 2017-09-28 ENCOUNTER — Telehealth: Payer: Self-pay | Admitting: Obstetrics and Gynecology

## 2017-09-28 NOTE — Telephone Encounter (Signed)
Patient would like to stay on wait list to see Dr Quincy Simmonds for aex.

## 2017-10-03 ENCOUNTER — Ambulatory Visit: Payer: 59 | Admitting: Obstetrics and Gynecology

## 2017-10-08 DIAGNOSIS — Z0000 Encounter for general adult medical examination without abnormal findings: Secondary | ICD-10-CM | POA: Diagnosis not present

## 2017-10-15 DIAGNOSIS — Z6826 Body mass index (BMI) 26.0-26.9, adult: Secondary | ICD-10-CM | POA: Diagnosis not present

## 2017-10-15 DIAGNOSIS — Z299 Encounter for prophylactic measures, unspecified: Secondary | ICD-10-CM | POA: Diagnosis not present

## 2017-10-15 DIAGNOSIS — Z0000 Encounter for general adult medical examination without abnormal findings: Secondary | ICD-10-CM | POA: Diagnosis not present

## 2017-10-15 DIAGNOSIS — Z789 Other specified health status: Secondary | ICD-10-CM | POA: Diagnosis not present

## 2017-10-15 DIAGNOSIS — Z1331 Encounter for screening for depression: Secondary | ICD-10-CM | POA: Diagnosis not present

## 2017-10-18 ENCOUNTER — Ambulatory Visit: Payer: 59 | Admitting: Obstetrics and Gynecology

## 2017-11-02 ENCOUNTER — Other Ambulatory Visit: Payer: Self-pay

## 2017-11-02 ENCOUNTER — Encounter: Payer: Self-pay | Admitting: Obstetrics and Gynecology

## 2017-11-02 ENCOUNTER — Other Ambulatory Visit (HOSPITAL_COMMUNITY)
Admission: RE | Admit: 2017-11-02 | Discharge: 2017-11-02 | Disposition: A | Discharge status: Home / Self Care | Payer: 59 | Source: Ambulatory Visit | Attending: Obstetrics and Gynecology | Admitting: Obstetrics and Gynecology

## 2017-11-02 ENCOUNTER — Ambulatory Visit (INDEPENDENT_AMBULATORY_CARE_PROVIDER_SITE_OTHER): Payer: 59 | Admitting: Obstetrics and Gynecology

## 2017-11-02 VITALS — BP 102/60 | HR 72 | Resp 16 | Ht 64.25 in | Wt 152.0 lb

## 2017-11-02 DIAGNOSIS — Z01419 Encounter for gynecological examination (general) (routine) without abnormal findings: Secondary | ICD-10-CM

## 2017-11-02 DIAGNOSIS — R87610 Atypical squamous cells of undetermined significance on cytologic smear of cervix (ASC-US): Secondary | ICD-10-CM | POA: Diagnosis not present

## 2017-11-02 DIAGNOSIS — Z1151 Encounter for screening for human papillomavirus (HPV): Secondary | ICD-10-CM | POA: Diagnosis not present

## 2017-11-02 DIAGNOSIS — N926 Irregular menstruation, unspecified: Secondary | ICD-10-CM

## 2017-11-02 NOTE — Patient Instructions (Signed)

## 2017-11-02 NOTE — Progress Notes (Signed)
47 y.o. G58P0013 Married Caucasian female here for annual exam.    Had menses in August and September 2018.  Had a cycle again on 10/12/17 for 2 weeks.  Needed to use pads.  Seen by Dr. Talbert Nan last fall and had normal exam and negative UPT. No US done.   She and her husband are dieting and loosing weight.  Estranged daughter is in [place].  Labs with PCP.  PCP:  Dr. Woody Seller   Patient's last menstrual period was 10/12/2017.           Sexually active: Yes.    The current method of family planning is IUD--Mirena inserted 11-10-13.    Exercising: No.  The patient does not participate in regular exercise at present. Smoker:  no  Health Maintenance: Pap: 08/18/16 colpo biopsies showing LGSIL; 06/30/16 ASCUS and negative HR HPV History of abnormal Pap:  Yes, 07/16/15 biopsy of exocervix - LGSIL and ECC benign;  06-07-15 ASCUS:Neg HR HPV with colposcopy revealing LGSIL of exocervix and Neg ECC. 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 she has never used smokeless tobacco. She reports that she does not drink alcohol or use drugs.  Past Medical History:  Diagnosis Date  . Abnormal Pap smear of cervix 06/2015   ASCUS   . Cervical dysplasia 2017   colposcopic biopsy  . Osteopenia 08/2014  . Personal history of venous thrombosis and embolism     Past Surgical History:  Procedure Laterality Date  . C-sections  2002, 2008  . DILATION AND CURETTAGE OF UTERUS  1997  . wisdom teeth extracted      Current Outpatient Medications  Medication Sig Dispense Refill  . cetirizine (ZYRTEC) 10 MG tablet Take 10 mg by mouth daily.    . Cholecalciferol (VITAMIN D3) 5000 UNITS TABS Take 1 tablet by mouth daily.    Marland Kitchen ibuprofen (ADVIL,MOTRIN) 200 MG tablet Take 400 mg by mouth 2 (two) times daily as needed for headache or moderate pain.     Marland Kitchen  levonorgestrel (MIRENA) 20 MCG/24HR IUD 1 each by Intrauterine route once.    Vladimir Faster Glycol-Propyl Glycol (SYSTANE OP) Apply 1-2 drops to eye 3 (three) times daily as needed (dry eyes).    . Calcium-Magnesium-Vitamin D (CALCIUM 1200+D3) 600-40-500 MG-MG-UNIT TB24 Take 2 tablets by mouth daily.    . cycloSPORINE (RESTASIS) 0.05 % ophthalmic emulsion Place 1 drop into both eyes 2 (two) times daily.     No current facility-administered medications for this visit.     Family History  Problem Relation Age of Onset  . Coronary artery disease Father   . Hypertension Father   . Hemochromatosis Father   . Cancer Father        prostate  . Coronary artery disease Paternal Grandmother   . Diabetes Paternal Grandmother   . Hypertension Paternal Grandmother   . Breast cancer Maternal Grandmother   . Osteoporosis Maternal Grandmother   . Alzheimer's disease Maternal Grandmother     ROS:  Pertinent items are noted in HPI.  Otherwise, a comprehensive ROS was negative.  Exam:   BP 102/60 (BP Location: Right Arm, Patient Position: Sitting, Cuff Size: Normal)   Pulse 72   Resp 16   Ht 5' 4.25" (1.632 m)   Wt 152 lb (68.9 kg)  LMP 10/12/2017   BMI 25.89 kg/m     General appearance: alert, cooperative and appears stated age Head: Normocephalic, without obvious abnormality, atraumatic Neck: no adenopathy, supple, symmetrical, trachea midline and thyroid normal to inspection and palpation Lungs: clear to auscultation bilaterally Breasts: normal appearance, no masses or tenderness, No nipple retraction or dimpling, No nipple discharge or bleeding, No axillary or supraclavicular adenopathy Heart: regular rate and rhythm Abdomen: soft, non-tender; no masses, no organomegaly Extremities: extremities normal, atraumatic, no cyanosis or edema Skin: Skin color, texture, turgor normal. No rashes or lesions Lymph nodes: Cervical, supraclavicular, and axillary nodes normal. No abnormal inguinal nodes  palpated Neurologic: Grossly normal  Pelvic: External genitalia:  no lesions              Urethra:  normal appearing urethra with no masses, tenderness or lesions              Bartholins and Skenes: normal                 Vagina: normal appearing vagina with normal color and discharge, no lesions              Cervix: no lesions.  IUD strings seen.              Pap taken: Yes.   Bimanual Exam:  Uterus:  normal size, contour, position, consistency, mobility, non-tender              Adnexa: no mass, fullness, tenderness              Rectal exam: Yes.  .  Confirms.              Anus:  normal sphincter tone, no lesions  Chaperone was present for exam.  Assessment:   Well woman visit with normal exam. Mirena IUD.  Irregular menses.  LGSIL.  Hx PE.  Mild pelvic organ prolapse and stress incontinence.   Plan: Mammogram screening discussed. Recommended self breast awareness. Pap and HR HPV as above. Guidelines for Calcium, Vitamin D, regular exercise program including cardiovascular and weight bearing exercise. Pelvic US.  Follow up annually and prn.   After visit summary provided.

## 2017-11-06 ENCOUNTER — Ambulatory Visit: Payer: 59 | Admitting: Obstetrics and Gynecology

## 2017-11-07 LAB — CYTOLOGY - PAP
Diagnosis: NEGATIVE
HPV: NOT DETECTED

## 2017-11-09 ENCOUNTER — Telehealth: Payer: Self-pay | Admitting: *Deleted

## 2017-11-09 NOTE — Telephone Encounter (Signed)
-----   Message from Nunzio Cobbs, MD sent at 11/08/2017  6:46 AM EST ----- Please inform patient that her pap is normal and her high risk HPV testing is negative. She is due for her next pap and HR HPV testing in one year at her annual exam. Pap recall - 08 - prior LGSIL.

## 2017-11-09 NOTE — Telephone Encounter (Signed)
Spoke with patient and gave results. Patient voiced understanding -eh

## 2017-11-09 NOTE — Telephone Encounter (Signed)
Left message for patient to call regarding lab results. Placed patient in 08 recall -eh

## 2017-11-12 ENCOUNTER — Telehealth: Payer: Self-pay | Admitting: Obstetrics and Gynecology

## 2017-11-12 NOTE — Telephone Encounter (Signed)
Spoke with patient regarding benefit for recommended ultrasound. Patient understood and agreeable. Patient advises she is driving and does not have access to her schedule. Patient states she will check  her schedule and check with her employer, then call back to schedule.

## 2017-11-14 ENCOUNTER — Telehealth: Payer: Self-pay | Admitting: Obstetrics and Gynecology

## 2017-11-14 NOTE — Telephone Encounter (Signed)
Patient returned call. Reviewed benefits again for ultrasound. Patient is scheduled 11/22/17 with Dr Quincy Simmonds. Patient is aware of the appointment date, arrival time and cancellation policy. Patient had no further questions. Ok to close

## 2017-11-14 NOTE — Telephone Encounter (Signed)
Patient called and cancelled her ultrasound for 11/22/17 due to a work conflict. She'd like to reschedule to 11/29/17 at 8:30 AM if the slot is still available.  Routing to triage Cc: Judy Walter

## 2017-11-14 NOTE — Telephone Encounter (Signed)
Spoke with patient, PUS rescheduled to 11/29/17 at 8:30am with consult to follow at 9am with Dr. Quincy Simmonds.  Routing to provider for final review. Patient is agreeable to disposition. Will close encounter.   Cc: Lerry Liner

## 2017-11-22 ENCOUNTER — Other Ambulatory Visit: Payer: 59

## 2017-11-22 ENCOUNTER — Other Ambulatory Visit: Payer: 59 | Admitting: Obstetrics and Gynecology

## 2017-11-28 NOTE — Progress Notes (Signed)
GYNECOLOGY  VISIT   HPI: 47 y.o.   Married  Caucasian  female   [phone] with Patient's last menstrual period was 11/22/2017.   here for   Pelvic ultrasound.  Here for bleeding with Mirena IUD.  Bleeding August, September, February, and then again March 14. Menses August and Sept were each for 5 days.  Menses in Feb for 2 weeks and had moderate flow.  This cycle is now slowing down and has been going on for 7 days.   Postcoital spotting.   Lost 19 pounds.   GYNECOLOGIC HISTORY: Patient's last menstrual period was 11/22/2017. Contraception:  Mirena IUD inserted 11/10/13 Menopausal hormone therapy:  none Last mammogram:  01/05/17 BIRADS 1 negative/density b Last pap smear:   11/02/17 Pap and HR HPV negative 08/18/16 colpo biopsies showing LGSIL; 06/30/16 ASCUS and negative HR HPV History of abnormal Pap:  Yes, 07/16/15 biopsy of exocervix - LGSIL and ECC benign;  06-07-15 ASCUS:Neg HR HPV with colposcopy revealing LGSIL of exocervix and Neg ECC. 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 HYPERLIPIDEMIA TYPE I / IV 04/14/2009  . 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 Abnormal Pap smear of cervix 06/2015   ASCUS   . Cervical dysplasia 2017   colposcopic biopsy  . Osteopenia 08/2014  . Personal history of venous thrombosis and embolism     Past Surgical History:  Procedure Laterality Date  . C-sections  2002, 2008  . DILATION AND CURETTAGE OF UTERUS  1997  . wisdom teeth extracted      Current Outpatient Medications  Medication Sig Dispense Refill  . Calcium-Magnesium-Vitamin D (CALCIUM 1200+D3) 600-40-500 MG-MG-UNIT TB24 Take 2 tablets by mouth daily.    . cetirizine (ZYRTEC) 10 MG tablet Take 10 mg by  mouth daily.    . Cholecalciferol (VITAMIN D3) 5000 UNITS TABS Take 1 tablet by mouth daily.    . cycloSPORINE (RESTASIS) 0.05 % ophthalmic emulsion Place 1 drop into both eyes 2 (two) times daily.    Marland Kitchen ibuprofen (ADVIL,MOTRIN) 200 MG tablet Take 400 mg by mouth 2 (two) times daily as needed for headache or moderate pain.     Marland Kitchen levonorgestrel (MIRENA) 20 MCG/24HR IUD 1 each by Intrauterine route once.    Vladimir Faster Glycol-Propyl Glycol (SYSTANE OP) Apply 1-2 drops to eye 3 (three) times daily as needed (dry eyes).     No current facility-administered medications for this visit.      ALLERGIES: Sulfonamide derivatives  Family History  Problem Relation Age of Onset  . Coronary artery disease Father   . Hypertension Father   . Hemochromatosis Father   . Cancer Father        prostate  . Coronary artery disease Paternal Grandmother   . Diabetes Paternal Grandmother   . Hypertension Paternal Grandmother   . Breast cancer Maternal Grandmother   . Osteoporosis Maternal Grandmother   .  Alzheimer's disease Maternal Grandmother     Social History   Socioeconomic History  . Marital status: Married    Spouse name: Not on file  . Number of children: Not on file  . Years of education: Not on file  . Highest education level: Not on file  Occupational History  . Not on file  Social Needs  . Financial resource strain: Not on file  . Food insecurity:    Worry: Not on file    Inability: Not on file  . Transportation needs:    Medical: Not on file    Non-medical: Not on file  Tobacco Use  . Smoking status: Never Smoker  . Smokeless tobacco: Never Used  Substance and Sexual Activity  . Alcohol use: No    Alcohol/week: 0.0 oz    Comment: rare wine  . Drug use: No  . Sexual activity: Yes    Partners: Male    Birth control/protection: IUD    Comment: Mirena inserted 11-10-13   Lifestyle  . Physical activity:    Days per week: Not on file    Minutes per session: Not on file  .  Stress: Not on file  Relationships  . Social connections:    Talks on phone: Not on file    Gets together: Not on file    Attends religious service: Not on file    Active member of club or organization: Not on file    Attends meetings of clubs or organizations: Not on file    Relationship status: Not on file  . Intimate partner violence:    Fear of current or ex partner: Not on file    Emotionally abused: Not on file    Physically abused: Not on file    Forced sexual activity: Not on file  Other Topics Concern  . Not on file  Social History Narrative   Full time...RN...orthopedic floor. Regularly exercises- walks.     ROS:  Pertinent items are noted in HPI.  PHYSICAL EXAMINATION:    BP 106/74 (BP Location: Right Arm, Patient Position: Sitting, Cuff Size: Normal)   Pulse 64   Resp 12   Wt 152 lb (68.9 kg)   LMP 11/22/2017   BMI 25.89 kg/m     General appearance: alert, cooperative and appears stated age  Pelvic US Normal myometrium.  IUD in canal.  EMS 3.71 mm and symmetrical. Left ovarian follicle. 24 mm right ovarian cyst, simple.  No free fluid.   ASSESSMENT  Resumption of menses with Mirena IUD.  Significant weight loss.  Small right simple ovarian cyst.  PLAN  No need for further evaluation at this time.  Her resumption of cycle may be due to weight loss and return to ovulatory function.  If she continues with prolonged menses, we can replace her Farley Ly IUD and do an endometrial biopsy at the same time.    An After Visit Summary was printed and given to the patient.  ___15___ minutes face to face time of which over 50% was spent in counseling.

## 2017-11-29 ENCOUNTER — Ambulatory Visit (INDEPENDENT_AMBULATORY_CARE_PROVIDER_SITE_OTHER): Payer: 59 | Admitting: Obstetrics and Gynecology

## 2017-11-29 ENCOUNTER — Ambulatory Visit (INDEPENDENT_AMBULATORY_CARE_PROVIDER_SITE_OTHER): Payer: 59

## 2017-11-29 ENCOUNTER — Encounter: Payer: Self-pay | Admitting: Obstetrics and Gynecology

## 2017-11-29 ENCOUNTER — Other Ambulatory Visit: Payer: Self-pay

## 2017-11-29 VITALS — BP 106/74 | HR 64 | Resp 12 | Ht 64.25 in | Wt 152.0 lb

## 2017-11-29 DIAGNOSIS — N926 Irregular menstruation, unspecified: Secondary | ICD-10-CM

## 2017-11-29 DIAGNOSIS — N921 Excessive and frequent menstruation with irregular cycle: Secondary | ICD-10-CM | POA: Diagnosis not present

## 2017-11-29 DIAGNOSIS — Z30431 Encounter for routine checking of intrauterine contraceptive device: Secondary | ICD-10-CM | POA: Diagnosis not present

## 2017-11-29 DIAGNOSIS — N83201 Unspecified ovarian cyst, right side: Secondary | ICD-10-CM

## 2017-11-29 NOTE — Patient Instructions (Signed)
Track your menstrual periods and call back if they are prolonged.   Thank you!

## 2017-11-29 NOTE — Progress Notes (Signed)
Encounter reviewed by Dr. Hadi Dubin Amundson C. Silva.  

## 2017-12-05 ENCOUNTER — Other Ambulatory Visit: Payer: Self-pay | Admitting: Obstetrics and Gynecology

## 2017-12-05 DIAGNOSIS — Z1231 Encounter for screening mammogram for malignant neoplasm of breast: Secondary | ICD-10-CM

## 2018-01-07 ENCOUNTER — Ambulatory Visit
Admission: RE | Admit: 2018-01-07 | Discharge: 2018-01-07 | Disposition: A | Discharge status: Home / Self Care | Payer: 59 | Source: Ambulatory Visit | Attending: Obstetrics and Gynecology | Admitting: Obstetrics and Gynecology

## 2018-01-07 DIAGNOSIS — Z1231 Encounter for screening mammogram for malignant neoplasm of breast: Secondary | ICD-10-CM

## 2018-01-08 ENCOUNTER — Encounter

## 2018-04-16 ENCOUNTER — Telehealth: Payer: Self-pay | Admitting: Obstetrics and Gynecology

## 2018-04-16 NOTE — Telephone Encounter (Signed)
Spoke with patinet. She reports several weeks of feeling hot flashes and increased mood changes.  Feels like she cannot handle these symptoms and wants to discuss with Dr. Quincy Simmonds about her options.  No further bleeding with Mirena IUD, periods have stopped per patient. No increased stressors other than "usual life".  No thoughts of self harm or harm to anyone else.  Office visit with Dr. Quincy Simmonds scheduled for 04/22/18 at 1030.  Pt will call back with any increase in symptoms or concerns prior to then.  Encounter closed.

## 2018-04-16 NOTE — Telephone Encounter (Signed)
Patient is having menopausal issues.

## 2018-04-18 NOTE — Progress Notes (Signed)
GYNECOLOGY  VISIT   HPI: 47 y.o.   Married  Caucasian  female   [phone] with Patient's last menstrual period was 12/12/2017.   here for hot flashes and mood changes for one months.   Having night sweats also.   Feeling irritable.  Feels like PMS symptoms.  Very tearful last week.  Denies suicidal or homicidal ideation.   Having vaginal dryness.   Last cycle was 16 days in length.   Lost 30 pounds intentionally.  Family is on a new eating plan.   Stress at this time of year which is when her daughter left home, now 3 years ago. Used Wellbutrin when she had post partum stress and depression.   GYNECOLOGIC HISTORY: Patient's last menstrual period was 12/12/2017. Contraception:  Mirena IUD inserted 11/10/13 Menopausal hormone therapy:  none Last mammogram:  01/05/17 BIRADS 1 negative/density b Last pap smear:   11/02/17 Pap and HR HPV negative        OB History    Gravida  4   Para  3   Term      Preterm      AB  1   Living  3     SAB  1   TAB      Ectopic      Multiple      Live Births                 Patient Active Problem List   Diagnosis Date Noted  . HYPERLIPIDEMIA TYPE I / IV 04/14/2009  . 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 Abnormal Pap smear of cervix 06/2015   ASCUS   . Cervical dysplasia 2017   colposcopic biopsy  . Osteopenia 08/2014  . Personal history of venous thrombosis and embolism     Past Surgical History:  Procedure Laterality Date  . C-sections  2002, 2008  . DILATION AND CURETTAGE OF UTERUS  1997  . wisdom teeth extracted      Current Outpatient Medications  Medication Sig Dispense Refill  . cetirizine (ZYRTEC) 10 MG tablet Take 10 mg by mouth daily.    . Cholecalciferol (VITAMIN D3) 5000 UNITS TABS Take 1 tablet by mouth daily.    . cycloSPORINE (RESTASIS) 0.05 % ophthalmic emulsion Place 1 drop into both eyes 2 (two) times daily.    Marland Kitchen ibuprofen (ADVIL,MOTRIN) 200 MG  tablet Take 400 mg by mouth 2 (two) times daily as needed for headache or moderate pain.     Marland Kitchen KRILL OIL PO Take by mouth daily.    Marland Kitchen levonorgestrel (MIRENA) 20 MCG/24HR IUD 1 each by Intrauterine route once.    Marland Kitchen MAGNESIUM PO Take by mouth daily.    Vladimir Faster Glycol-Propyl Glycol (SYSTANE OP) Apply 1-2 drops to eye 3 (three) times daily as needed (dry eyes).    Marland Kitchen POTASSIUM PO Take by mouth daily. Powder     No current facility-administered medications for this visit.      ALLERGIES: Sulfonamide derivatives  Family History  Problem Relation Age of Onset  . Coronary artery disease Father   . Hypertension Father   . Hemochromatosis Father   . Cancer Father        prostate  . Coronary artery disease Paternal Grandmother   . Diabetes Paternal Grandmother   . Hypertension Paternal Grandmother   . Breast cancer Maternal Grandmother   . Osteoporosis Maternal Grandmother   . Alzheimer's disease Maternal Grandmother  Social History   Socioeconomic History  . Marital status: Married    Spouse name: Not on file  . Number of children: Not on file  . Years of education: Not on file  . Highest education level: Not on file  Occupational History  . Not on file  Social Needs  . Financial resource strain: Not on file  . Food insecurity:    Worry: Not on file    Inability: Not on file  . Transportation needs:    Medical: Not on file    Non-medical: Not on file  Tobacco Use  . Smoking status: Never Smoker  . Smokeless tobacco: Never Used  Substance and Sexual Activity  . Alcohol use: No    Alcohol/week: 0.0 standard drinks    Comment: rare wine  . Drug use: No  . Sexual activity: Yes    Partners: Male    Birth control/protection: IUD    Comment: Mirena inserted 11-10-13   Lifestyle  . Physical activity:    Days per week: Not on file    Minutes per session: Not on file  . Stress: Not on file  Relationships  . Social connections:    Talks on phone: Not on file    Gets  together: Not on file    Attends religious service: Not on file    Active member of club or organization: Not on file    Attends meetings of clubs or organizations: Not on file    Relationship status: Not on file  . Intimate partner violence:    Fear of current or ex partner: Not on file    Emotionally abused: Not on file    Physically abused: Not on file    Forced sexual activity: Not on file  Other Topics Concern  . Not on file  Social History Narrative   Full time...RN...orthopedic floor. Regularly exercises- walks.     Review of Systems  All other systems reviewed and are negative.   PHYSICAL EXAMINATION:    BP 100/62 (BP Location: Right Arm, Patient Position: Sitting, Cuff Size: Normal)   Pulse 76   Resp 14   Ht 5' 4.25" (1.632 m)   Wt 138 lb (62.6 kg)   LMP 12/12/2017   BMI 23.50 kg/m     General appearance: alert, cooperative and appears stated age   ASSESSMENT  Menopausal symptoms.  Mirena IUD.  Hx PE.  Not candidate for estrogen therapy.  Stress.  PLAN  We talked about menopause transition and importance of reproductive heath, cardiovascular health, and bone health. Will check FSH, E2, and TSH. We reviewed herbal remedies (no specific contraindication), SSRI, SNRI, and Gabapentin.  She will start Zoloft 50 mg daily.  Discussed side effects.  FU in 6 weeks.   An After Visit Summary was printed and given to the patient.  __40____ minutes face to face time of which over 50% was spent in counseling.

## 2018-04-22 ENCOUNTER — Ambulatory Visit: Payer: 59 | Admitting: Obstetrics and Gynecology

## 2018-04-22 ENCOUNTER — Other Ambulatory Visit: Payer: Self-pay

## 2018-04-22 ENCOUNTER — Encounter: Payer: Self-pay | Admitting: Obstetrics and Gynecology

## 2018-04-22 VITALS — BP 100/62 | HR 76 | Resp 14 | Ht 64.25 in | Wt 138.0 lb

## 2018-04-22 DIAGNOSIS — F439 Reaction to severe stress, unspecified: Secondary | ICD-10-CM | POA: Diagnosis not present

## 2018-04-22 DIAGNOSIS — N951 Menopausal and female climacteric states: Secondary | ICD-10-CM | POA: Diagnosis not present

## 2018-04-22 MED ORDER — SERTRALINE HCL 50 MG PO TABS: 50.0000 mg | ORAL_TABLET | Freq: Every day | ORAL | 1 refills | Status: DC

## 2018-04-22 NOTE — Patient Instructions (Signed)

## 2018-04-23 DIAGNOSIS — Z6823 Body mass index (BMI) 23.0-23.9, adult: Secondary | ICD-10-CM | POA: Diagnosis not present

## 2018-04-23 DIAGNOSIS — Z299 Encounter for prophylactic measures, unspecified: Secondary | ICD-10-CM | POA: Diagnosis not present

## 2018-04-23 DIAGNOSIS — J329 Chronic sinusitis, unspecified: Secondary | ICD-10-CM | POA: Diagnosis not present

## 2018-04-23 DIAGNOSIS — Z713 Dietary counseling and surveillance: Secondary | ICD-10-CM | POA: Diagnosis not present

## 2018-04-23 DIAGNOSIS — Z789 Other specified health status: Secondary | ICD-10-CM | POA: Diagnosis not present

## 2018-04-23 LAB — ESTRADIOL

## 2018-04-23 LAB — FOLLICLE STIMULATING HORMONE: FSH: 28.4 m[IU]/mL

## 2018-04-23 LAB — TSH: TSH: 1.96 u[IU]/mL (ref 0.450–4.500)

## 2018-06-03 NOTE — Progress Notes (Signed)
GYNECOLOGY  VISIT   HPI: 47 y.o.   Married  Caucasian  female   [phone] with No LMP recorded. (Menstrual status: IUD).   here for   6 week follow for Zoloft Patient states that she has started to have have hot flashes at night that wake her up.  Started Zoloft for irritability and night sweats.   Seeing a little bit of of difference after a few weeks but not working so well now.  Hot flashes are random. Her frustration is not really controlled.  The end of her day is when she feels the mood change the most.  Tired at the end of the day.  No sleepiness during the day. States she is happy most days.  Youngest daughter having transition issues at school. Her eldest daughter is not in contact with the family.   Patient is not in counseling.   GYNECOLOGIC HISTORY: No LMP recorded. (Menstrual status: IUD). Contraception:  IUD  Menopausal hormone therapy:  None  Last mammogram:  01/07/18 Density category B Bi-rads category 1 neg  Last pap smear:   11/02/17 Pap and HR HPV negative        OB History    Gravida  4   Para  3   Term      Preterm      AB  1   Living  3     SAB  1   TAB      Ectopic      Multiple      Live Births                 Patient Active Problem List   Diagnosis Date Noted  . HYPERLIPIDEMIA TYPE I / IV 04/14/2009  . 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 Abnormal Pap smear of cervix 06/2015   ASCUS   . Cervical dysplasia 2017   colposcopic biopsy  . Osteopenia 08/2014  . Personal history of venous thrombosis and embolism     Past Surgical History:  Procedure Laterality Date  . C-sections  2002, 2008  . DILATION AND CURETTAGE OF UTERUS  1997  . wisdom teeth extracted      Current Outpatient Medications  Medication Sig Dispense Refill  . cetirizine (ZYRTEC) 10 MG tablet Take 10 mg by mouth daily.    . Cholecalciferol (VITAMIN D3) 5000 UNITS TABS Take 1 tablet by mouth daily.    .  cycloSPORINE (RESTASIS) 0.05 % ophthalmic emulsion Place 1 drop into both eyes 2 (two) times daily.    Marland Kitchen ibuprofen (ADVIL,MOTRIN) 200 MG tablet Take 400 mg by mouth 2 (two) times daily as needed for headache or moderate pain.     Marland Kitchen KRILL OIL PO Take by mouth daily.    Marland Kitchen levonorgestrel (MIRENA) 20 MCG/24HR IUD 1 each by Intrauterine route once.    Marland Kitchen MAGNESIUM PO Take by mouth daily.    Vladimir Faster Glycol-Propyl Glycol (SYSTANE OP) Apply 1-2 drops to eye 3 (three) times daily as needed (dry eyes).    Marland Kitchen POTASSIUM PO Take by mouth daily. Powder    . sertraline (ZOLOFT) 50 MG tablet Take 1 tablet (50 mg total) by mouth daily. 30 tablet 1   No current facility-administered medications for this visit.      ALLERGIES: Sulfonamide derivatives  Family History  Problem Relation Age of Onset  . Coronary artery disease Father   . Hypertension Father   . Hemochromatosis Father   .  Cancer Father        prostate  . Coronary artery disease Paternal Grandmother   . Diabetes Paternal Grandmother   . Hypertension Paternal Grandmother   . Breast cancer Maternal Grandmother   . Osteoporosis Maternal Grandmother   . Alzheimer's disease Maternal Grandmother     Social History   Socioeconomic History  . Marital status: Married    Spouse name: Not on file  . Number of children: Not on file  . Years of education: Not on file  . Highest education level: Not on file  Occupational History  . Not on file  Social Needs  . Financial resource strain: Not on file  . Food insecurity:    Worry: Not on file    Inability: Not on file  . Transportation needs:    Medical: Not on file    Non-medical: Not on file  Tobacco Use  . Smoking status: Never Smoker  . Smokeless tobacco: Never Used  Substance and Sexual Activity  . Alcohol use: No    Alcohol/week: 0.0 standard drinks    Comment: rare wine  . Drug use: No  . Sexual activity: Yes    Partners: Male    Birth control/protection: IUD    Comment:  Mirena inserted 11-10-13   Lifestyle  . Physical activity:    Days per week: Not on file    Minutes per session: Not on file  . Stress: Not on file  Relationships  . Social connections:    Talks on phone: Not on file    Gets together: Not on file    Attends religious service: Not on file    Active member of club or organization: Not on file    Attends meetings of clubs or organizations: Not on file    Relationship status: Not on file  . Intimate partner violence:    Fear of current or ex partner: Not on file    Emotionally abused: Not on file    Physically abused: Not on file    Forced sexual activity: Not on file  Other Topics Concern  . Not on file  Social History Narrative   Full time...RN...orthopedic floor. Regularly exercises- walks.     Review of Systems  All other systems reviewed and are negative.   PHYSICAL EXAMINATION:    BP 112/68   Pulse 68   Resp 14   Ht 5' 4.25" (1.632 m)   Wt 136 lb (61.7 kg)   BMI 23.16 kg/m     General appearance: alert, cooperative and appears stated age   ASSESSMENT   Situational stress.  Menopausal symptoms.   PLAN  We talked about increasing Zoloft, adding Wellbutrin, or switching to Effexor XR. Will increase Zoloft to 100 mg daily. If this is not effective, will try Effexor.  She knows to limit her NSAIDs. I recommended she do some counseling. Ho from [facility].    An After Visit Summary was printed and given to the patient.  ______ minutes face to face time of which over 50% was spent in counseling.

## 2018-06-05 ENCOUNTER — Encounter: Payer: Self-pay | Admitting: Obstetrics and Gynecology

## 2018-06-05 ENCOUNTER — Ambulatory Visit (INDEPENDENT_AMBULATORY_CARE_PROVIDER_SITE_OTHER): Payer: 59 | Admitting: Obstetrics and Gynecology

## 2018-06-05 VITALS — BP 112/68 | HR 68 | Resp 14 | Ht 64.25 in | Wt 136.0 lb

## 2018-06-05 DIAGNOSIS — N951 Menopausal and female climacteric states: Secondary | ICD-10-CM | POA: Diagnosis not present

## 2018-06-05 DIAGNOSIS — F439 Reaction to severe stress, unspecified: Secondary | ICD-10-CM | POA: Diagnosis not present

## 2018-06-05 MED ORDER — SERTRALINE HCL 100 MG PO TABS: 100.0000 mg | ORAL_TABLET | Freq: Every day | ORAL | 5 refills | Status: DC

## 2018-06-07 DIAGNOSIS — Z789 Other specified health status: Secondary | ICD-10-CM | POA: Diagnosis not present

## 2018-06-07 DIAGNOSIS — J329 Chronic sinusitis, unspecified: Secondary | ICD-10-CM | POA: Diagnosis not present

## 2018-06-07 DIAGNOSIS — Z299 Encounter for prophylactic measures, unspecified: Secondary | ICD-10-CM | POA: Diagnosis not present

## 2018-06-07 DIAGNOSIS — Z713 Dietary counseling and surveillance: Secondary | ICD-10-CM | POA: Diagnosis not present

## 2018-06-07 DIAGNOSIS — Z6823 Body mass index (BMI) 23.0-23.9, adult: Secondary | ICD-10-CM | POA: Diagnosis not present

## 2018-07-19 MED FILL — SERTRALINE HCL 100 MG TAB: 100 | 30 days supply | Qty: 30 | Fill #0

## 2018-08-19 MED FILL — SERTRALINE HCL 100 MG TAB: 100 | 30 days supply | Qty: 30 | Fill #1

## 2018-09-17 MED FILL — SERTRALINE HCL 100 MG TAB: 100 | 30 days supply | Qty: 30 | Fill #2

## 2018-10-04 DIAGNOSIS — H16223 Keratoconjunctivitis sicca, not specified as Sjogren's, bilateral: Secondary | ICD-10-CM | POA: Diagnosis not present

## 2018-10-22 MED FILL — SERTRALINE HCL 100 MG TAB: 100 | 30 days supply | Qty: 30 | Fill #3

## 2018-11-05 ENCOUNTER — Ambulatory Visit: Payer: 59 | Admitting: Obstetrics and Gynecology

## 2018-11-27 ENCOUNTER — Ambulatory Visit (INDEPENDENT_AMBULATORY_CARE_PROVIDER_SITE_OTHER): Payer: 59 | Admitting: Obstetrics and Gynecology

## 2018-11-27 ENCOUNTER — Encounter: Payer: Self-pay | Admitting: Obstetrics and Gynecology

## 2018-11-27 ENCOUNTER — Other Ambulatory Visit: Payer: Self-pay

## 2018-11-27 ENCOUNTER — Other Ambulatory Visit (HOSPITAL_COMMUNITY)
Admission: RE | Admit: 2018-11-27 | Discharge: 2018-11-27 | Disposition: A | Discharge status: Home / Self Care | Payer: 59 | Source: Ambulatory Visit | Attending: Obstetrics and Gynecology | Admitting: Obstetrics and Gynecology

## 2018-11-27 VITALS — BP 110/62 | HR 66 | Resp 14 | Ht 64.25 in | Wt 145.0 lb

## 2018-11-27 DIAGNOSIS — Z1211 Encounter for screening for malignant neoplasm of colon: Secondary | ICD-10-CM | POA: Diagnosis not present

## 2018-11-27 DIAGNOSIS — Z01419 Encounter for gynecological examination (general) (routine) without abnormal findings: Secondary | ICD-10-CM | POA: Insufficient documentation

## 2018-11-27 MED ORDER — VENLAFAXINE HCL ER 37.5 MG PO CP24: 37.5000 mg | ORAL_CAPSULE | Freq: Every day | ORAL | 2 refills | Status: DC

## 2018-11-27 MED FILL — VENLAFAXINE HCL ER 37.5 MG: 37.5 | 30 days supply | Qty: 30 | Fill #0

## 2018-11-27 NOTE — Patient Instructions (Signed)

## 2018-11-27 NOTE — Progress Notes (Signed)
48 y.o. [id number] Married Caucasian female here for annual exam.  She would like to talk about her Zoloft she has tried to cut in half because is still feeling sleeping. But she went back to 100 because she was irritable.   Has Mirena IUD.  No bleeding.  Some hot flashes.  FSH and estradiol perimenopausal range last year.   Has a sore of her labia majora a couple of weeks ago.  It drained.   PCP:   Jerene Bears  No LMP recorded. (Menstrual status: IUD).           Sexually active: Yes.    The current method of family planning is IUD.   Mirena placed 11/10/13.  Exercising: Yes.    Walking Smoker:  no  Health Maintenance: Pap:  11/02/17 Pap and HR HPV negative; 08/18/16 colpo biopsies showing LGSIL; 06/30/16 ASCUS and negative HR HPV History of abnormal Pap:  Yes, 07/16/15 biopsy of exocervix - LGSIL and ECC benign;  06-07-15 ASCUS:Neg HR HPV with colposcopy revealing LGSIL of exocervix and Neg ECC. 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 She has never used smokeless tobacco. She reports that she does not drink alcohol or use drugs.  Past Medical History:  Diagnosis Date  . Abnormal Pap smear of cervix 06/2015   ASCUS   . Cervical dysplasia 2017   colposcopic biopsy  . Osteopenia 08/2014  . Personal history of venous thrombosis and embolism     Past Surgical History:  Procedure Laterality Date  . C-sections  2002, 2008  . DILATION AND CURETTAGE OF UTERUS  1997  . wisdom teeth extracted      Current Outpatient Medications  Medication Sig Dispense Refill  . Cholecalciferol (VITAMIN D3) 5000 UNITS TABS Take 1 tablet by mouth daily.    Marland Kitchen ibuprofen (ADVIL,MOTRIN) 200 MG tablet Take 400 mg by mouth 2 (two) times daily as needed for headache or moderate pain.     Marland Kitchen  KRILL OIL PO Take by mouth daily.    Marland Kitchen levonorgestrel (MIRENA) 20 MCG/24HR IUD 1 each by Intrauterine route once.    Marland Kitchen MAGNESIUM PO Take by mouth daily.    Vladimir Faster Glycol-Propyl Glycol (SYSTANE OP) Apply 1-2 drops to eye 3 (three) times daily as needed (dry eyes).    Marland Kitchen POTASSIUM PO Take by mouth daily. Powder    . sertraline (ZOLOFT) 100 MG tablet Take 1 tablet (100 mg total) by mouth daily. 30 tablet 5   No current facility-administered medications for this visit.     Family History  Problem Relation Age of Onset  . Coronary artery disease Father   . Hypertension Father   . Hemochromatosis Father   . Cancer Father        prostate  . Coronary artery disease Paternal Grandmother   . Diabetes Paternal Grandmother   . Hypertension Paternal Grandmother   . Breast cancer Maternal Grandmother   . Osteoporosis Maternal Grandmother   . Alzheimer's disease Maternal Grandmother     Review of Systems  All other systems reviewed and are negative.   Exam:   BP 110/62   Pulse 66   Resp 14   Ht 5' 4.25" (1.632 m)  Wt 145 lb (65.8 kg)   BMI 24.70 kg/m     General appearance: alert, cooperative and appears stated age Head: Normocephalic, without obvious abnormality, atraumatic Neck: no adenopathy, supple, symmetrical, trachea midline and thyroid normal to inspection and palpation Lungs: clear to auscultation bilaterally Breasts: normal appearance, no masses or tenderness, No nipple retraction or dimpling, No nipple discharge or bleeding, No axillary or supraclavicular adenopathy Heart: regular rate and rhythm Abdomen: soft, non-tender; no masses, no organomegaly Extremities: extremities normal, atraumatic, no cyanosis or edema Skin: Skin color, texture, turgor normal. No rashes or lesions Lymph nodes: Cervical, supraclavicular, and axillary nodes normal. No abnormal inguinal nodes palpated Neurologic: Grossly normal  Pelvic: External genitalia:  no lesions              Urethra:   normal appearing urethra with no masses, tenderness or lesions              Bartholins and Skenes: normal                 Vagina: normal appearing vagina with normal color and discharge, no lesions              Cervix: no lesions              Pap taken: Yes.   Bimanual Exam:  Uterus:  normal size, contour, position, consistency, mobility, non-tender              Adnexa: no mass, fullness, tenderness              Rectal exam: Yes.  .  Confirms.              Anus:  normal sphincter tone, no lesions  Chaperone was present for exam.  Assessment:   Well woman visit with normal exam. Mirena IUD.  Perimenopausal female. Anxiety/stress. LGSIL.  Hx PE.  Mild pelvic organ prolapse and stress incontinence.   Plan: Mammogram screening. Recommended self breast awareness. Pap and HR HPV as above. Guidelines for Calcium, Vitamin D, regular exercise program including cardiovascular and weight bearing exercise. Switch from Zoloft to Effexor XR.  IFOB. Return for a recheck in 2 months and FSH, estradiol check.  May due one additional year of Mirena IUD.  She will decide if she wants to do routine labs here in 2 months or not. Follow up annually and prn.   After visit summary provided.

## 2018-11-29 ENCOUNTER — Ambulatory Visit: Payer: 59 | Admitting: Obstetrics and Gynecology

## 2018-12-02 LAB — CYTOLOGY - PAP
Diagnosis: UNDETERMINED — AB
HPV (WINDOPATH): NOT DETECTED

## 2018-12-04 ENCOUNTER — Telehealth: Payer: Self-pay

## 2018-12-04 DIAGNOSIS — R87610 Atypical squamous cells of undetermined significance on cytologic smear of cervix (ASC-US): Secondary | ICD-10-CM

## 2018-12-04 NOTE — Telephone Encounter (Signed)
Spoke with patient. Results given. Patient has an IUD. Patient verbalizes understanding. Colposcopy scheduled for 12/16/2018 at 11:30 am with Dr.Silva. Patient is agreeable to date and time.  Instructions given. Motrin 800 mg po x , one hour before appointment with food. Make sure to eat a meal before appointment and drink plenty of fluids.Patient agreeable and verbalized understanding of all instructions. Order placed. Encounter closed.

## 2018-12-04 NOTE — Telephone Encounter (Signed)
-----   Message from Nunzio Cobbs, MD sent at 12/03/2018  8:51 AM EDT ----- Please report results of colposcopy showing ASCUS, negative HR HPV.  She has a recent history of LGSIL.  She needs a colposcopy with me.

## 2018-12-16 ENCOUNTER — Encounter: Payer: Self-pay | Admitting: Obstetrics and Gynecology

## 2018-12-16 ENCOUNTER — Other Ambulatory Visit: Payer: Self-pay

## 2018-12-16 ENCOUNTER — Ambulatory Visit (INDEPENDENT_AMBULATORY_CARE_PROVIDER_SITE_OTHER): Payer: 59 | Admitting: Obstetrics and Gynecology

## 2018-12-16 VITALS — BP 112/60 | HR 68 | Resp 14 | Ht 64.25 in | Wt 149.0 lb

## 2018-12-16 DIAGNOSIS — Z01812 Encounter for preprocedural laboratory examination: Secondary | ICD-10-CM

## 2018-12-16 DIAGNOSIS — R87610 Atypical squamous cells of undetermined significance on cytologic smear of cervix (ASC-US): Secondary | ICD-10-CM

## 2018-12-16 LAB — POCT URINE PREGNANCY: Preg Test, Ur: NEGATIVE

## 2018-12-16 NOTE — Progress Notes (Signed)
  Subjective:     Patient ID: Judy Rockers., female   DOB: 1971/01/13, 48 y.o.   MRN: [id number]  HPI  Pap History: 11/27/18 ASCUS:Neg HR HPV  11/02/17 Pap and HR HPV negative; 12/8/17colpo biopsies showing LGSIL; 10/20/17ASCUS and negative HR HPV 11/4/16biopsy of exocervix - LGSIL and ECC benign;06-07-15 ASCUS:Neg HR HPV with colposcopy revealing LGSIL of exocervix and Neg ECC. 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  This was Thursday last week. She declines symptoms of Covid.  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   She states she intends to work with Covid patients in the future.   Review of Systems  Constitutional: Negative.   HENT: Negative.   Eyes: Negative.   Respiratory: Negative.   Cardiovascular: Negative.   Gastrointestinal: Negative.   Endocrine: Negative.   Genitourinary: Negative.   Musculoskeletal: Negative.   Skin: Negative.   Allergic/Immunologic: Negative.   Neurological: Negative.   Hematological: Negative.   Psychiatric/Behavioral: Negative.     Contraception: Mirena IUD LMP: 12/13/17 UPT: Negative     Objective:   Physical Exam  Patient in no acute distress.  Not using a face mask.     Assessment:     ASCUS, negative HR HPV.  Prior LGSIL. Exposure to a Covid 19 patient and used proper PPE.    Plan:     I did excuse myself briefly and assessed the situation with our nursing supervisor, Judy Walter.   [facility], Judy Walter, was contacted to assess the situation, and decision made to suspend the procedure for now after discussing with the patient.  The possibility of high grade dysplasia and malignancy is very low.  Patient will be placed in 6 month recall.

## 2018-12-27 MED FILL — VENLAFAXINE HCL ER 37.5 MG: 37.5 | 30 days supply | Qty: 30 | Fill #1

## 2019-01-22 ENCOUNTER — Telehealth: Payer: Self-pay | Admitting: *Deleted

## 2019-01-22 ENCOUNTER — Other Ambulatory Visit: Payer: Self-pay | Admitting: Obstetrics and Gynecology

## 2019-01-22 MED ORDER — VENLAFAXINE HCL ER 37.5 MG PO CP24: 37.5000 mg | ORAL_CAPSULE | Freq: Every day | ORAL | 1 refills | Status: DC

## 2019-01-22 NOTE — Telephone Encounter (Signed)
She does not need a Web Ex if she is doing well.  I will refill her Effexor until she returns in August.

## 2019-01-22 NOTE — Telephone Encounter (Signed)
Call to patient. Has 2 month medication recheck next week. Offered to schedule as Webex to promote social distancing under Covid 19. Patient didn't realize she had appointment.  States medication is working fine and she is doing well on Effexor ER 37.5 mg. Still has some slight symptoms but overall feeling better. Since she has to come for 6 month pap, feels she can wait for med recheck till then.  Request Dr Quincy Simmonds review and advise if appointment is needed.

## 2019-01-23 NOTE — Telephone Encounter (Signed)
Call to patient. Advised of instructions from Dr Quincy Simmonds. Patient has already received notice from pharmacy for refill. Appointment scheduled for 04-30-2019 for 6 month pap and med follow-up.  Advised of Covid restrictions currently in place and will update as we get closer to appointment.  Encouraged My Chart registration.   Encounter closed.

## 2019-01-27 ENCOUNTER — Ambulatory Visit: Payer: 59 | Admitting: Obstetrics and Gynecology

## 2019-01-28 ENCOUNTER — Telehealth: Payer: Self-pay | Admitting: *Deleted

## 2019-01-28 NOTE — Telephone Encounter (Signed)
Follow-up call to patient. Advised have confirmed with Dr Quincy Simmonds and plan for colpo +/-pap in August.Appointment is scheduled for 04-30-19. Advised to take Motrin one hour prior with food.  Encounter closed.

## 2019-02-06 ENCOUNTER — Inpatient Hospital Stay (HOSPITAL_COMMUNITY)
Admission: EM | Admit: 2019-02-06 | Discharge: 2019-02-08 | DRG: 066 | Disposition: A | Discharge status: Home / Self Care | Payer: 59 | Attending: Internal Medicine | Admitting: Internal Medicine

## 2019-02-06 ENCOUNTER — Encounter (HOSPITAL_COMMUNITY): Payer: Self-pay

## 2019-02-06 ENCOUNTER — Emergency Department (HOSPITAL_COMMUNITY): Payer: 59

## 2019-02-06 ENCOUNTER — Other Ambulatory Visit: Payer: Self-pay

## 2019-02-06 ENCOUNTER — Telehealth: Payer: Self-pay | Admitting: Obstetrics and Gynecology

## 2019-02-06 DIAGNOSIS — E785 Hyperlipidemia, unspecified: Secondary | ICD-10-CM | POA: Diagnosis present

## 2019-02-06 DIAGNOSIS — Z8249 Family history of ischemic heart disease and other diseases of the circulatory system: Secondary | ICD-10-CM | POA: Diagnosis not present

## 2019-02-06 DIAGNOSIS — Z882 Allergy status to sulfonamides status: Secondary | ICD-10-CM

## 2019-02-06 DIAGNOSIS — M8580 Other specified disorders of bone density and structure, unspecified site: Secondary | ICD-10-CM | POA: Diagnosis present

## 2019-02-06 DIAGNOSIS — E783 Hyperchylomicronemia: Secondary | ICD-10-CM | POA: Diagnosis present

## 2019-02-06 DIAGNOSIS — Z87410 Personal history of cervical dysplasia: Secondary | ICD-10-CM

## 2019-02-06 DIAGNOSIS — R002 Palpitations: Secondary | ICD-10-CM | POA: Diagnosis present

## 2019-02-06 DIAGNOSIS — I82409 Acute embolism and thrombosis of unspecified deep veins of unspecified lower extremity: Secondary | ICD-10-CM | POA: Diagnosis not present

## 2019-02-06 DIAGNOSIS — R4702 Dysphasia: Secondary | ICD-10-CM | POA: Diagnosis present

## 2019-02-06 DIAGNOSIS — I6389 Other cerebral infarction: Secondary | ICD-10-CM | POA: Diagnosis not present

## 2019-02-06 DIAGNOSIS — R41840 Attention and concentration deficit: Secondary | ICD-10-CM | POA: Diagnosis not present

## 2019-02-06 DIAGNOSIS — I10 Essential (primary) hypertension: Secondary | ICD-10-CM | POA: Diagnosis present

## 2019-02-06 DIAGNOSIS — Z79899 Other long term (current) drug therapy: Secondary | ICD-10-CM | POA: Diagnosis not present

## 2019-02-06 DIAGNOSIS — R471 Dysarthria and anarthria: Secondary | ICD-10-CM | POA: Diagnosis not present

## 2019-02-06 DIAGNOSIS — Z299 Encounter for prophylactic measures, unspecified: Secondary | ICD-10-CM | POA: Diagnosis not present

## 2019-02-06 DIAGNOSIS — R4789 Other speech disturbances: Secondary | ICD-10-CM | POA: Diagnosis not present

## 2019-02-06 DIAGNOSIS — I639 Cerebral infarction, unspecified: Principal | ICD-10-CM | POA: Diagnosis present

## 2019-02-06 DIAGNOSIS — R4781 Slurred speech: Secondary | ICD-10-CM | POA: Diagnosis not present

## 2019-02-06 DIAGNOSIS — Z789 Other specified health status: Secondary | ICD-10-CM | POA: Diagnosis not present

## 2019-02-06 DIAGNOSIS — Z86711 Personal history of pulmonary embolism: Secondary | ICD-10-CM

## 2019-02-06 DIAGNOSIS — Z793 Long term (current) use of hormonal contraceptives: Secondary | ICD-10-CM | POA: Diagnosis not present

## 2019-02-06 DIAGNOSIS — Z20828 Contact with and (suspected) exposure to other viral communicable diseases: Secondary | ICD-10-CM | POA: Diagnosis present

## 2019-02-06 DIAGNOSIS — R29701 NIHSS score 1: Secondary | ICD-10-CM | POA: Diagnosis present

## 2019-02-06 DIAGNOSIS — R479 Unspecified speech disturbances: Secondary | ICD-10-CM | POA: Diagnosis not present

## 2019-02-06 DIAGNOSIS — Z86718 Personal history of other venous thrombosis and embolism: Secondary | ICD-10-CM | POA: Diagnosis not present

## 2019-02-06 DIAGNOSIS — Z03818 Encounter for observation for suspected exposure to other biological agents ruled out: Secondary | ICD-10-CM | POA: Diagnosis not present

## 2019-02-06 DIAGNOSIS — R4701 Aphasia: Secondary | ICD-10-CM | POA: Diagnosis present

## 2019-02-06 DIAGNOSIS — Z975 Presence of (intrauterine) contraceptive device: Secondary | ICD-10-CM | POA: Diagnosis not present

## 2019-02-06 HISTORY — DX: Cerebral infarction, unspecified: I63.9

## 2019-02-06 LAB — CBC WITH DIFFERENTIAL/PLATELET
Abs Immature Granulocytes: 0.01 10*3/uL (ref 0.00–0.07)
Basophils Absolute: 0.1 10*3/uL (ref 0.0–0.1)
Basophils Relative: 1 %
Eosinophils Absolute: 0.2 10*3/uL (ref 0.0–0.5)
Eosinophils Relative: 4 %
HCT: 43.4 % (ref 36.0–46.0)
Hemoglobin: 14.3 g/dL (ref 12.0–15.0)
Immature Granulocytes: 0 %
Lymphocytes Relative: 35 %
Lymphs Abs: 1.9 10*3/uL (ref 0.7–4.0)
MCH: 31.2 pg (ref 26.0–34.0)
MCHC: 32.9 g/dL (ref 30.0–36.0)
MCV: 94.6 fL (ref 80.0–100.0)
Monocytes Absolute: 0.4 10*3/uL (ref 0.1–1.0)
Monocytes Relative: 8 %
Neutro Abs: 2.8 10*3/uL (ref 1.7–7.7)
Neutrophils Relative %: 52 %
Platelets: 226 10*3/uL (ref 150–400)
RBC: 4.59 MIL/uL (ref 3.87–5.11)
RDW: 12.5 % (ref 11.5–15.5)
WBC: 5.4 10*3/uL (ref 4.0–10.5)
nRBC: 0 % (ref 0.0–0.2)

## 2019-02-06 LAB — URINALYSIS, ROUTINE W REFLEX MICROSCOPIC
Bilirubin Urine: NEGATIVE
Glucose, UA: NEGATIVE mg/dL
Hgb urine dipstick: NEGATIVE
Ketones, ur: NEGATIVE mg/dL
Leukocytes,Ua: NEGATIVE
Nitrite: NEGATIVE
Protein, ur: NEGATIVE mg/dL
Specific Gravity, Urine: 1.011 (ref 1.005–1.030)
pH: 7 (ref 5.0–8.0)

## 2019-02-06 LAB — COMPREHENSIVE METABOLIC PANEL
ALT: 29 U/L (ref 0–44)
AST: 25 U/L (ref 15–41)
Albumin: 4.2 g/dL (ref 3.5–5.0)
Alkaline Phosphatase: 61 U/L (ref 38–126)
Anion gap: 9 (ref 5–15)
BUN: 21 mg/dL — ABNORMAL HIGH (ref 6–20)
CO2: 27 mmol/L (ref 22–32)
Calcium: 10.1 mg/dL (ref 8.9–10.3)
Chloride: 102 mmol/L (ref 98–111)
Creatinine, Ser: 0.6 mg/dL (ref 0.44–1.00)
GFR calc Af Amer: >60 mL/min (ref >60)
GFR calc non Af Amer: >60 mL/min (ref >60)
Glucose, Bld: 111 mg/dL — ABNORMAL HIGH (ref 70–99)
Potassium: 4 mmol/L (ref 3.5–5.1)
Sodium: 138 mmol/L (ref 135–145)
Total Bilirubin: 0.4 mg/dL (ref 0.3–1.2)
Total Protein: 6.8 g/dL (ref 6.5–8.1)

## 2019-02-06 LAB — SARS CORONAVIRUS 2 BY RT PCR (HOSPITAL ORDER, PERFORMED IN ~~LOC~~ HOSPITAL LAB): SARS Coronavirus 2: NEGATIVE

## 2019-02-06 MED ORDER — ASPIRIN EC 81 MG PO TBEC
81.0000 mg | DELAYED_RELEASE_TABLET | Freq: Every day | ORAL | Status: DC
  Administered 2019-02-07 – 2019-02-08 (×2): 81 mg via ORAL
  Filled 2019-02-06 (×2): qty 1

## 2019-02-06 MED ORDER — ATORVASTATIN CALCIUM 40 MG PO TABS
40.0000 mg | ORAL_TABLET | Freq: Every day | ORAL | Status: DC
  Administered 2019-02-07: 40 mg via ORAL
  Filled 2019-02-06: qty 1

## 2019-02-06 MED ORDER — ACETAMINOPHEN 325 MG PO TABS: 650.0000 mg | ORAL_TABLET | ORAL | Status: DC | PRN

## 2019-02-06 MED ORDER — ACETAMINOPHEN 160 MG/5ML PO SOLN: 650.0000 mg | ORAL | Status: DC | PRN

## 2019-02-06 MED ORDER — ENOXAPARIN SODIUM 40 MG/0.4ML ~~LOC~~ SOLN
40.0000 mg | SUBCUTANEOUS | Status: DC
  Administered 2019-02-06 – 2019-02-07 (×2): 40 mg via SUBCUTANEOUS
  Filled 2019-02-06 (×2): qty 0.4

## 2019-02-06 MED ORDER — ASPIRIN EC 325 MG PO TBEC
325.0000 mg | DELAYED_RELEASE_TABLET | Freq: Once | ORAL | Status: AC
  Administered 2019-02-06: 325 mg via ORAL
  Filled 2019-02-06: qty 1

## 2019-02-06 MED ORDER — ACETAMINOPHEN 650 MG RE SUPP: 650.0000 mg | RECTAL | Status: DC | PRN

## 2019-02-06 MED ORDER — SENNOSIDES-DOCUSATE SODIUM 8.6-50 MG PO TABS: 1.0000 | ORAL_TABLET | Freq: Every evening | ORAL | Status: DC | PRN

## 2019-02-06 MED ORDER — STROKE: EARLY STAGES OF RECOVERY BOOK
Freq: Once | Status: AC
  Administered 2019-02-06: 1
  Filled 2019-02-06: qty 1

## 2019-02-06 NOTE — Telephone Encounter (Signed)
Patient has been taking a different generic version of effexor for 5 days. Was taking another generic for 2 months with no problems. Now feels heavily sedated. States the dosage is the same, but she just "doesn't feel right".

## 2019-02-06 NOTE — ED Notes (Signed)
Patient transported to MRI 

## 2019-02-06 NOTE — Consult Note (Signed)
Referring Physician: Dr. Posey Pronto    Chief Complaint: Dysphasia  HPI: Judy Walter is an 48 y.o. female presenting for assessment of dysphasia. She had onset of slurred speech on Saturday. Initially felt that it was due to taking another generic version of Effexor, resulting in what she perceived as a feeling of heavy sedation and fatigue. 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 She denied F/C, N/V, and upper respiratory symptoms. Also no headache, vision loss, dysphagia, facial droop, neck pain, CP, SOB, abdominal pain, vertigo, diarrhea, urinary incontinence or weakness/numbness/incoordination of limbs. Blood sugar and BP checks at home were normal.   Of note, she has a history of venous thrombosis and embolism.    Past Medical History:  Diagnosis Date  . Abnormal Pap smear of cervix 06/2015   ASCUS   . Cervical dysplasia 2017   colposcopic biopsy  . Osteopenia 08/2014  . Personal history of venous thrombosis and embolism     Past Surgical History:  Procedure Laterality Date  . C-sections  2002, 2008  . DILATION AND CURETTAGE OF UTERUS  1997  . wisdom teeth extracted      Family History  Problem Relation Age of Onset  . Coronary artery disease Father   . Hypertension Father   . Hemochromatosis Father   . Cancer Father        prostate  . Coronary artery disease Paternal Grandmother   . Diabetes Paternal Grandmother   . Hypertension Paternal Grandmother   . Breast cancer Maternal Grandmother   . Osteoporosis Maternal Grandmother   . Alzheimer's disease Maternal Grandmother    Social History:  reports that she has never smoked. She has never used smokeless tobacco. She reports that she does not drink alcohol or use drugs.  Allergies:  Allergies  Allergen Reactions  . Sulfonamide Derivatives     REACTION: fever    Home Medications:    ROS: As per HPI. All other systems negative.    Physical Examination: Blood pressure 134/87, pulse 62, temperature 97.8  F (36.6 C), temperature source Oral, resp. rate 16, height 5' 4.5" (1.638 m), weight 64.9 kg, SpO2 98 %.  HEENT: [place]/AT Lungs: Respirations unlabored Ext: No edema  Neurologic Examination: Mental Status: Alert, oriented, thought content appropriate.  Speech with intermittent hesitation and loss of fluency. One phonemic paraphasia noted. Had difficulty repeating a phrase and mild difficulty with naming. Had difficulty with a 3-step directional command.  Cranial Nerves: II:  Visual fields intact. PERRL. III,IV, VI: No ptosis. EOMI. No nystagmus.  V,VII: No facial droop. Temp sensation equal  VIII: hearing intact to conversation IX,X: No hypophonia XI: Symmetric XII: midline tongue extension  Motor: 5/5 and symmetric proximally and distally in upper and lower extremities, with the exception of 4/5 ankle and toe plantar flexion on the right.  No pronator drift.  Subtle positive on right with rotating fingers test.  No bradykinesia noted.  Sensory: Temp and light touch intact throughout, bilaterally Deep Tendon Reflexes:  2+ bilateral brachioradialis, biceps, patellae and achilles Plantars: Right: downgoing   Left: downgoing Cerebellar: No ataxia with FNF bilaterally.  Gait: Deferred  Results for orders placed or performed during the hospital encounter of 02/06/19 (from the past 48 hour(s))  CBC with Differential     Status: None   Collection Time: 02/06/19  1:50 PM  Result Value Ref Range   WBC 5.4 4.0 - 10.5 K/uL   RBC 4.59 3.87 - 5.11 MIL/uL   Hemoglobin 14.3 12.0 -  15.0 g/dL   HCT 43.4 36.0 - 46.0 %   MCV 94.6 80.0 - 100.0 fL   MCH 31.2 26.0 - 34.0 pg   MCHC 32.9 30.0 - 36.0 g/dL   RDW 12.5 11.5 - 15.5 %   Platelets 226 150 - 400 K/uL   nRBC 0.0 0.0 - 0.2 %   Neutrophils Relative % 52 %   Neutro Abs 2.8 1.7 - 7.7 K/uL   Lymphocytes Relative 35 %   Lymphs Abs 1.9 0.7 - 4.0 K/uL   Monocytes Relative 8 %   Monocytes Absolute 0.4 0.1 - 1.0 K/uL   Eosinophils Relative 4 %    Eosinophils Absolute 0.2 0.0 - 0.5 K/uL   Basophils Relative 1 %   Basophils Absolute 0.1 0.0 - 0.1 K/uL   Immature Granulocytes 0 %   Abs Immature Granulocytes 0.01 0.00 - 0.07 K/uL    Comment: Performed at [facility] [address], [place], [place] [address]  Comprehensive metabolic panel     Status: Abnormal   Collection Time: 02/06/19  1:50 PM  Result Value Ref Range   Sodium 138 135 - 145 mmol/L   Potassium 4.0 3.5 - 5.1 mmol/L   Chloride 102 98 - 111 mmol/L   CO2 27 22 - 32 mmol/L   Glucose, Bld 111 (H) 70 - 99 mg/dL   BUN 21 (H) 6 - 20 mg/dL   Creatinine, Ser 0.60 0.44 - 1.00 mg/dL   Calcium 10.1 8.9 - 10.3 mg/dL   Total Protein 6.8 6.5 - 8.1 g/dL   Albumin 4.2 3.5 - 5.0 g/dL   AST 25 15 - 41 U/L   ALT 29 0 - 44 U/L   Alkaline Phosphatase 61 38 - 126 U/L   Total Bilirubin 0.4 0.3 - 1.2 mg/dL   GFR calc non Af Amer >60 >60 mL/min   GFR calc Af Amer >60 >60 mL/min   Anion gap 9 5 - 15    Comment: Performed at [facility] [address] [place], [place] [address]  SARS Coronavirus 2 (CEPHEID - Performed in [place] hospital lab), Hosp Order     Status: None   Collection Time: 02/06/19  1:54 PM  Result Value Ref Range   SARS Coronavirus 2 NEGATIVE NEGATIVE    Comment: (NOTE) If result is NEGATIVE SARS-CoV-2 target nucleic acids are NOT DETECTED. The SARS-CoV-2 RNA is generally detectable in upper and lower  respiratory specimens during the acute phase of infection. The lowest  concentration of SARS-CoV-2 viral copies this assay can detect is 250  copies / mL. A negative result does not preclude SARS-CoV-2 infection  and should not be used as the sole basis for treatment or other  patient management decisions.  A negative result may occur with  improper specimen collection / handling, submission of specimen other  than nasopharyngeal swab, presence of viral mutation(s) within the  areas targeted by this assay, and inadequate number of viral copies   (<250 copies / mL). A negative result must be combined with clinical  observations, patient history, and epidemiological information. If result is POSITIVE SARS-CoV-2 target nucleic acids are DETECTED. The SARS-CoV-2 RNA is generally detectable in upper and lower  respiratory specimens dur ing the acute phase of infection.  Positive  results are indicative of active infection with SARS-CoV-2.  Clinical  correlation with patient history and other diagnostic information is  necessary to determine patient infection status.  Positive results do  not rule  out bacterial infection or co-infection with other viruses. If result is PRESUMPTIVE POSTIVE SARS-CoV-2 nucleic acids MAY BE PRESENT.   A presumptive positive result was obtained on the submitted specimen  and confirmed on repeat testing.  While 2019 novel coronavirus  (SARS-CoV-2) nucleic acids may be present in the submitted sample  additional confirmatory testing may be necessary for epidemiological  and / or clinical management purposes  to differentiate between  SARS-CoV-2 and other Sarbecovirus currently known to infect humans.  If clinically indicated additional testing with an alternate test  methodology [phone]) is advised. The SARS-CoV-2 RNA is generally  detectable in upper and lower respiratory sp ecimens during the acute  phase of infection. The expected result is Negative. Fact Sheet for Patients:  StrictlyIdeas.no Fact Sheet for Healthcare Providers: BankingDealers.co.za This test is not yet approved or cleared by the [place] FDA and has been authorized for detection and/or diagnosis of SARS-CoV-2 by FDA under an Emergency Use Authorization (EUA).  This EUA will remain in effect (meaning this test can be used) for the duration of the COVID-19 declaration under Section 564(b)(1) of the Act, 21 U.S.C. section 360bbb-3(b)(1), unless the authorization is terminated or  revoked sooner. Performed at [facility], [place] [address], [place], [place] [address]   Urinalysis, Routine w reflex microscopic     Status: None   Collection Time: 02/06/19  2:24 PM  Result Value Ref Range   Color, Urine YELLOW YELLOW   APPearance CLEAR CLEAR   Specific Gravity, Urine 1.011 1.005 - 1.030   pH 7.0 5.0 - 8.0   Glucose, UA NEGATIVE NEGATIVE mg/dL   Hgb urine dipstick NEGATIVE NEGATIVE   Bilirubin Urine NEGATIVE NEGATIVE   Ketones, ur NEGATIVE NEGATIVE mg/dL   Protein, ur NEGATIVE NEGATIVE mg/dL   Nitrite NEGATIVE NEGATIVE   Leukocytes,Ua NEGATIVE NEGATIVE    Comment: Performed at [facility] [address], [place], [place] [address]   Ct Head Wo Contrast  Result Date: 02/06/2019 CLINICAL DATA:  Dysarthria EXAM: CT HEAD WITHOUT CONTRAST TECHNIQUE: Contiguous axial images were obtained from the base of the skull through the vertex without intravenous contrast. COMPARISON:  None. FINDINGS: Brain: The ventricles are normal in size and configuration. There is no intracranial mass, hemorrhage, extra-axial fluid collection, or midline shift. There is decreased attenuation at the junction of the left basal ganglia and inferior left centrum semiovale anteriorly, with suspected recent and potential acute infarct focally in this area. Elsewhere the brain parenchyma appears unremarkable. Vascular: No hyperdense vessel evident. There is calcification in each carotid siphon region. Skull: The bony calvarium appears intact. Sinuses/Orbits: There is mucosal thickening in the left maxillary antrum. There is mucosal thickening in several ethmoid air cells bilaterally. Other visualized paranasal sinuses are clear. Orbits appear symmetric bilaterally. Other: Mastoid air cells are clear. IMPRESSION: Decreased attenuation at the junction of the anterior basal ganglia and inferior centrum semiovale on the left, suspicious for recent and potentially acute infarct. Elsewhere brain  parenchyma appears unremarkable. No mass or hemorrhage. There are foci of arterial vascular calcification. There are areas of paranasal sinus disease. Electronically Signed   By: Lowella Grip III M.D.   On: 02/06/2019 14:47   Mr Brain Wo Contrast  Result Date: 02/06/2019 CLINICAL DATA:  Acute onset of slurred speech beginning 5 days ago. Abnormal head CT. EXAM: MRI HEAD WITHOUT CONTRAST TECHNIQUE: Multiplanar, multiecho pulse sequences of the brain and surrounding structures were obtained without intravenous contrast. COMPARISON:  CT same day  FINDINGS: Brain: Diffusion imaging shows acute infarction measuring approximately 2 cm in size within the left basal ganglia and radiating white matter tracts. No evidence of hemorrhage. No mass effect. No other acute finding. Elsewhere, there are mild chronic small-vessel ischemic changes of the hemispheric white matter. No hydrocephalus. No mass lesion. No extra-axial collection. Vascular: Major vessels at the base of the brain show flow. 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 No hemorrhage or mass effect. Mild chronic small-vessel ischemic change of the hemispheric white matter elsewhere. Electronically Signed   By: Nelson Chimes M.D.   On: 02/06/2019 17:56    Assessment: 48 y.o. 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  1. Only deficits on exam consist of mild dysphasia, subtle UMN deficit of RUE and weakness of toe and ankle plantar flexion on the right.  2. Stroke Risk Factors - History of venous thrombosis and embolism.   Plan: 1. HgbA1c, fasting lipid panel 2. MRA  of the brain without contrast 3. PT consult, OT consult, Speech consult 4. Echocardiogram 5. Carotid dopplers 6. Prophylactic therapy- ASA 81 mg po qd after loading with 325 mg po x 1 (completed) 7. Risk factor modification  8. Telemetry monitoring 9. Frequent neuro checks 10. BP management per standard protocol. Out of permissive HTN time window 11. Start statin if CK level is normal. LFTs are normal.    @Electronically  signed: Dr. Kerney Elbe  02/06/2019, 8:54 PM

## 2019-02-06 NOTE — ED Triage Notes (Signed)
Pt reports slurred speech since Saturday.

## 2019-02-06 NOTE — ED Notes (Signed)
Called [facility] to follow up on covid swab for Dr. Gilford Raid.

## 2019-02-06 NOTE — ED Provider Notes (Signed)
[facility] EMERGENCY DEPARTMENT Provider Note   CSN: [id number] Arrival date & time: 02/06/19  1320    History   Chief Complaint Chief Complaint  Patient presents with   speech issues    HPI Pema Thomure is a 48 y.o. female with a history of PE and HLD presenting with delayed speech since Saturday. She states she was in her usual state of health until Saturday when she started having trouble getting her speech out. She states it felt delayed and slower than usual. She has also been feeling more drowsy and fatigued since Saturday. She denies any fever, chills, chest pain, SOB, headaches, visual disturbances, sore throat, cough, difficulty swallowing, recent illnesses or sick contacts, abdominal pain, n/v, weakness or difficulty urinating or with bowel movements. She states she picked up new prescription for Effexor on Saturday and noticed it may be a different formulary. She spoke with her PCP about this who advised she may need to switch to brand only, come to the ED.      HPI  Past Medical History:  Diagnosis Date   Abnormal Pap smear of cervix 06/2015   ASCUS    Cervical dysplasia 2017   colposcopic biopsy   Osteopenia 08/2014   Personal history of venous thrombosis and embolism     Patient Active Problem List   Diagnosis Date Noted   HYPERLIPIDEMIA TYPE I / IV 04/14/2009   PULMONARY EMBOLISM, HX OF 04/03/2009    Past Surgical History:  Procedure Laterality Date   C-sections  2002, 2008   DILATION AND CURETTAGE OF UTERUS  1997   wisdom teeth extracted       OB History    Gravida  4   Para  3   Term      Preterm      AB  1   Living  3     SAB  1   TAB      Ectopic      Multiple      Live Births               Home Medications    Prior to Admission medications   Medication Sig Start Date End Date Taking? Authorizing Provider  Cholecalciferol (VITAMIN D3) 5000 UNITS TABS Take 1 tablet by mouth daily.     [provider]  ibuprofen (ADVIL,MOTRIN) 200 MG tablet Take 400 mg by mouth 2 (two) times daily as needed for headache or moderate pain.     [provider]  KRILL OIL PO Take by mouth daily.    [provider]  levonorgestrel (MIRENA) 20 MCG/24HR IUD 1 each by Intrauterine route once.    [provider]  MAGNESIUM PO Take by mouth daily.    [provider]  Polyethyl Glycol-Propyl Glycol (SYSTANE OP) Apply 1-2 drops to eye 3 (three) times daily as needed (dry eyes).    [provider]  POTASSIUM PO Take by mouth daily. Powder    [provider]  venlafaxine XR (EFFEXOR XR) 37.5 MG 24 hr capsule Take 1 capsule (37.5 mg total) by mouth daily. 01/22/19   Nunzio Cobbs, MD    Family History Family History  Problem Relation Age of Onset   Coronary artery disease Father    Hypertension Father    Hemochromatosis Father    Cancer Father        prostate   Coronary artery disease Paternal Grandmother    Diabetes Paternal Grandmother  Hypertension Paternal Grandmother    Breast cancer Maternal Grandmother    Osteoporosis Maternal Grandmother    Alzheimer's disease Maternal Grandmother     Social History Social History   Tobacco Use   Smoking status: Never Smoker   Smokeless tobacco: Never Used  Substance Use Topics   Alcohol use: No    Alcohol/week: 0.0 standard drinks    Comment: rare wine   Drug use: No     Allergies   Sulfonamide derivatives   Review of Systems Review of Systems  Constitutional: Positive for fatigue. Negative for chills, diaphoresis and fever.  HENT: Negative for congestion, rhinorrhea, sinus pressure, sinus pain, sneezing, sore throat, trouble swallowing and voice change.   Eyes: Negative for pain and visual disturbance.  Respiratory: Negative for cough, chest tightness, shortness of breath and wheezing.   Cardiovascular: Negative for chest pain and leg swelling.    Gastrointestinal: Negative for abdominal pain, constipation, diarrhea, nausea and vomiting.  Genitourinary: Negative for difficulty urinating, hematuria and urgency.  Musculoskeletal: Negative for myalgias, neck pain and neck stiffness.  Neurological: Positive for speech difficulty. Negative for dizziness, weakness, light-headedness and headaches.  Psychiatric/Behavioral: Negative for confusion.     Physical Exam Updated Vital Signs BP 121/81    Pulse (!) 59    Temp 98 F (36.7 C) (Oral)    Resp 16    Ht 5' 4.5" (1.638 m)    Wt 64.9 kg    SpO2 96%    BMI 24.17 kg/m   Physical Exam Constitutional:      General: She is not in acute distress.    Appearance: Normal appearance. She is not ill-appearing.  HENT:     Head: Normocephalic and atraumatic.     Mouth/Throat:     Mouth: Mucous membranes are dry.     Pharynx: Oropharynx is clear. No posterior oropharyngeal erythema.  Eyes:     Extraocular Movements: Extraocular movements intact.     Conjunctiva/sclera: Conjunctivae normal.     Pupils: Pupils are equal, round, and reactive to light.  Cardiovascular:     Rate and Rhythm: Normal rate and regular rhythm.     Heart sounds: Normal heart sounds. No murmur. No friction rub. No gallop.   Pulmonary:     Effort: Pulmonary effort is normal. No respiratory distress.     Breath sounds: Normal breath sounds. No wheezing or rales.  Abdominal:     General: Abdomen is flat. Bowel sounds are normal. There is no distension.     Palpations: Abdomen is soft.     Tenderness: There is no abdominal tenderness.  Musculoskeletal:        General: No swelling or tenderness.  Skin:    General: Skin is warm and dry.  Neurological:     General: No focal deficit present.     Mental Status: She is alert and oriented to person, place, and time.     Cranial Nerves: No cranial nerve deficit.     Sensory: No sensory deficit.     Motor: No weakness.  Psychiatric:        Mood and Affect: Mood normal.         Behavior: Behavior normal.        Thought Content: Thought content normal.        Judgment: Judgment normal.      ED Treatments / Results  Labs (all labs ordered are listed, but only abnormal results are displayed) Labs Reviewed  COMPREHENSIVE METABOLIC PANEL - Abnormal; Notable  for the following components:      Result Value   Glucose, Bld 111 (*)    BUN 21 (*)    All other components within normal limits  SARS CORONAVIRUS 2 (HOSPITAL ORDER, PERFORMED IN [facility])  CBC WITH DIFFERENTIAL/PLATELET  URINALYSIS, ROUTINE W REFLEX MICROSCOPIC    EKG EKG Interpretation  Date/Time:  Thursday Feb 06 2019 13:27:09 EDT Ventricular Rate:  66 PR Interval:  150 QRS Duration: 88 QT Interval:  422 QTC Calculation: 442 R Axis:   82 Text Interpretation:  Normal sinus rhythm Normal ECG Confirmed by Isla Pence [phone]) on 02/06/2019 2:50:39 PM   Radiology Ct Head Wo Contrast  Result Date: 02/06/2019 CLINICAL DATA:  Dysarthria EXAM: CT HEAD WITHOUT CONTRAST TECHNIQUE: Contiguous axial images were obtained from the base of the skull through the vertex without intravenous contrast. COMPARISON:  None. FINDINGS: Brain: The ventricles are normal in size and configuration. There is no intracranial mass, hemorrhage, extra-axial fluid collection, or midline shift. There is decreased attenuation at the junction of the left basal ganglia and inferior left centrum semiovale anteriorly, with suspected recent and potential acute infarct focally in this area. Elsewhere the brain parenchyma appears unremarkable. Vascular: No hyperdense vessel evident. There is calcification in each carotid siphon region. Skull: The bony calvarium appears intact. Sinuses/Orbits: There is mucosal thickening in the left maxillary antrum. There is mucosal thickening in several ethmoid air cells bilaterally. Other visualized paranasal sinuses are clear. Orbits appear symmetric bilaterally. Other: Mastoid air  cells are clear. IMPRESSION: Decreased attenuation at the junction of the anterior basal ganglia and inferior centrum semiovale on the left, suspicious for recent and potentially acute infarct. Elsewhere brain parenchyma appears unremarkable. No mass or hemorrhage. There are foci of arterial vascular calcification. There are areas of paranasal sinus disease. Electronically Signed   By: Lowella Grip III M.D.   On: 02/06/2019 14:47   Mr Brain Wo Contrast  Result Date: 02/06/2019 CLINICAL DATA:  Acute onset of slurred speech beginning 5 days ago. Abnormal head CT. EXAM: MRI HEAD WITHOUT CONTRAST TECHNIQUE: Multiplanar, multiecho pulse sequences of the brain and surrounding structures were obtained without intravenous contrast. COMPARISON:  CT same day FINDINGS: Brain: Diffusion imaging shows acute infarction measuring approximately 2 cm in size within the left basal ganglia and radiating white matter tracts. No evidence of hemorrhage. No mass effect. No other acute finding. Elsewhere, there are mild chronic small-vessel ischemic changes of the hemispheric white matter. No hydrocephalus. No mass lesion. No extra-axial collection. Vascular: Major vessels at the base of the brain show flow. 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 No hemorrhage or mass effect. Mild chronic small-vessel ischemic change of the hemispheric white matter elsewhere. Electronically Signed   By: Nelson Chimes M.D.   On: 02/06/2019 17:56    Procedures Procedures (including critical care time)  Medications Ordered in ED Medications - No data to display   Initial Impression / Assessment and Plan / ED Course  I have reviewed the triage vital signs and the nursing notes.  Pertinent labs & imaging results that were available during my care of the patient were reviewed by me and considered in my  medical decision making (see chart for details).  Pt is a 48 year old female who presents to the ED with difficulty with her speech and fatigue since Saturday. No focal deficits, neuro exam is  normal. She picked up a new prescription for Effexor on Saturday and noticed it may be a different formulary. She has been taking this medication since 3/20 and doing well on it. Her symptoms could possibly be due to this formulary change in her Effexor. Will obtain a CT head and labs to rule out stroke or other etiology. 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   CT head suspicious for recent acute infarct. Will order MRI brain.  Spoke with neurology who states CT findings could be chronic and will wait for MRI.   MRI findings show a 2 cm acute infarction in the left basal ganglia and radiating white matter tracts.  Will re-consult neuro and medicine for admission.    Final Clinical Impressions(s) / ED Diagnoses   Final diagnoses:  Difficulty with speech  Cerebrovascular accident (CVA), unspecified mechanism [facility])    ED Discharge Orders    None       Neah Sporrer N, DO 02/06/19 1806    Isla Pence, MD 02/06/19 1907

## 2019-02-06 NOTE — Telephone Encounter (Addendum)
Spoke with patient and her spouse, per patient request. Speech slurred. Patient had her Effexor filled at a new pharmacy, has been feeling "sedated" and "fatigued" since she started the Rx. Patient states symptoms started Saturday, 02/01/19. Has been on Effexor since 11/2018 and had been doing well. Spouse states capsule looks different, confirmed medication is venlafaxine XR 37.5mg . Advised may be different manufacture, this varies with different pharmacies. 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 Denies fever/chills, N/V, upper respiratory symptoms. Has checked her blood sugar and BP at home, reports as normal. Patient is scheduled to see her PCP today at 12pm. Advised patient to keep OV with PCP as scheduled, ER earlier should any new symptoms develop. Advised I will review with Dr. Quincy Simmonds and return call. Patient and spouse agreeable.

## 2019-02-06 NOTE — ED Notes (Signed)
Report attempted 

## 2019-02-06 NOTE — Progress Notes (Signed)
Pt admitted to the unit from ED; pt A&O x4; MAE x4; pt oriented to the unit and room; fall/safety precaution and prevention education completed. Skin dry intact with no pressure ulcer or opened wounds noted; skin assessment completed per protocol with RN Estill Bamberg. Telemetry applied and verified with CCMD: NT called to second verify; VSS; bed alarm on; call light within reach. Will continue to closely monitor. Delia Heady RN   02/06/19 1955  Vitals  Temp 97.8 F (36.6 C)  Temp Source Oral  BP 134/87  MAP (mmHg) 98  BP Location Left Arm  BP Method Automatic  Patient Position (if appropriate) Lying  Pulse Rate 62  Pulse Rate Source Monitor  ECG Heart Rate 62  Resp 16  Oxygen Therapy  SpO2 98 %  O2 Device Room Air  Pain Assessment  Pain Scale 0-10  Pain Score 0  MEWS Score  MEWS RR 0  MEWS Pulse 0  MEWS Systolic 0  MEWS LOC 0  MEWS Temp 0  MEWS Score 0  MEWS Score Color Nyoka Cowden

## 2019-02-06 NOTE — ED Notes (Signed)
Called MRI to request pt to be scanned. She is behind the pt on the table, and a out pt at this time.

## 2019-02-06 NOTE — Telephone Encounter (Signed)
Routing to Dr. Silva.  

## 2019-02-06 NOTE — ED Notes (Signed)
Called lab and spoke with Judy Walter. Apparently the test was found and is running now and should be available in 15 min.

## 2019-02-06 NOTE — Telephone Encounter (Signed)
Call reviewed with Dr. Quincy Simmonds, call returned to patient. Spoke with patient. Advised to keep OV with PCP as scheduled for further evaluation, if PCP agrees OK to continue Effexor, return call to office, may need to switch Effexor to brand only. Patient or spouse will return call to office to provide update. Patient verbalizes understanding.

## 2019-02-06 NOTE — ED Notes (Signed)
Called macro again, Judy Walter at [phone] says to call in 10 min to give him time to look for the test.

## 2019-02-06 NOTE — Telephone Encounter (Signed)
Patient is currently in the emergency department having imaging of the brain and Covid 19 testing.

## 2019-02-06 NOTE — H&P (Signed)
History and Physical    Judy Walter YBO:[id number] DOB: [birth year] DOA: 02/06/2019  PCP: Glenda Chroman, MD  Patient coming from: Home  I have personally briefly reviewed patient's old medical records in [place]  Chief Complaint: Speech difficulty  HPI: Judy Walter is a 48 y.o. 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  Patient reports new onset of expressive aphasia beginning on 02/03/2019 as well as fatigue.  She initially thought this was secondary to change in formulation of her home Effexor (prescribed for menopausal hot flashes), however presented to the ED as advised by her PCP.  Patient denies any associated dysarthria, dysphagia, odynophagia, headache, change in vision, focal weakness, change in coordination, change in gait, chest pain, palpitations, dyspnea, nausea, vomiting, abdominal pain, diarrhea, subjective fevers, chills, diaphoresis, or dysuria.  She is a never smoker and denies any alcohol or illicit drug use.  ED Course:  Initial vitals showed BP 132/92, pulse 61, RR 18, temp 98.0 Fahrenheit, SPO2 100% on room air.  Labs are notable for WBC 5.4, hemoglobin 14.3, platelets 226,000, BUN 21, creatinine 0.6.  Urinalysis is negative for UTI.  SARS-CoV-2 test is negative.  CT head without contrast showed changes at the left basal ganglia suspicious for potential infarction.  No mass or intracranial hemorrhage was seen.  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  Neurology were consulted and recommended medical admission for further stroke work-up.  Patient was given aspirin 325 mg orally and the hospitalist service was consulted to admit for further evaluation and management.  Review of Systems: All systems reviewed and are negative except as documented in history of  present illness above.   Past Medical History:  Diagnosis Date   Abnormal Pap smear of cervix 06/2015   ASCUS    Cervical dysplasia 2017   colposcopic biopsy   Osteopenia 08/2014   Personal history of venous thrombosis and embolism     Past Surgical History:  Procedure Laterality Date   C-sections  2002, 2008   [place] OF UTERUS  1997   wisdom teeth extracted      Social History:  reports that she has never smoked. She has never used smokeless tobacco. She reports that she does not drink alcohol or use drugs.  Allergies  Allergen Reactions   Sulfonamide Derivatives     REACTION: fever    Family History  Problem Relation Age of Onset   Coronary artery disease Father    Hypertension Father    Hemochromatosis Father    Cancer Father        prostate   Coronary artery disease Paternal Grandmother    Diabetes Paternal Grandmother    Hypertension Paternal Grandmother    Breast cancer Maternal Grandmother    Osteoporosis Maternal Grandmother    Alzheimer's disease Maternal Grandmother      Prior to Admission medications   Medication Sig Start Date End Date Taking? Authorizing Provider  Cholecalciferol (VITAMIN D3) 5000 UNITS TABS Take 1 tablet by mouth daily.    [provider]  ibuprofen (ADVIL,MOTRIN) 200 MG tablet Take 400 mg by mouth 2 (two) times daily as needed for headache or moderate pain.     [provider]  KRILL OIL PO Take by mouth daily.    [provider]  levonorgestrel (MIRENA) 20  MCG/24HR IUD 1 each by Intrauterine route once.    [provider]  MAGNESIUM PO Take by mouth daily.    [provider]  Polyethyl Glycol-Propyl Glycol (SYSTANE OP) Apply 1-2 drops to eye 3 (three) times daily as needed (dry eyes).    [provider]  POTASSIUM PO Take by mouth daily. Powder    [provider]  venlafaxine XR (EFFEXOR XR) 37.5 MG 24 hr capsule Take 1 capsule (37.5  mg total) by mouth daily. 01/22/19   Nunzio Cobbs, MD    Physical Exam: Vitals:   02/06/19 1415 02/06/19 1645 02/06/19 1700 02/06/19 1955  BP: 121/81 121/78 116/83 134/87  Pulse: (!) 59 (!) 59 (!) 59 62  Resp: 16 15 14 16   Temp:    97.8 F (36.6 C)  TempSrc:    Oral  SpO2: 96% 99% 96% 98%  Weight:      Height:        Constitutional: Sitting up in bed, NAD, calm, comfortable Eyes: PERRL, EOMI, lids and conjunctivae normal ENMT: Mucous membranes are moist. Posterior pharynx clear of any exudate or lesions.Normal dentition.  Neck: normal, supple, no masses. Respiratory: clear to auscultation bilaterally, no wheezing, no crackles. Normal respiratory effort. No accessory muscle use.  Cardiovascular: Regular rate and rhythm, no murmurs / rubs / gallops. No extremity edema. 2+ pedal pulses. Abdomen: no tenderness, no masses palpated. No hepatosplenomegaly. Bowel sounds positive.  Musculoskeletal: no clubbing / cyanosis. No joint deformity upper and lower extremities. Good ROM, no contractures. Normal muscle tone.  Skin: no rashes, lesions, ulcers. No induration Neurologic: CN 2-12 grossly intact. Sensation intact, DTR normal. Strength 5/5 in all 4.  No dysmetria or dysdiadochokinesia. Psychiatric: Normal judgment and insight. Alert and oriented x 3. Normal mood.     Labs on Admission: I have personally reviewed following labs and imaging studies  CBC: Recent Labs  Lab 02/06/19 1350  WBC 5.4  NEUTROABS 2.8  HGB 14.3  HCT 43.4  MCV 94.6  PLT 902   Basic Metabolic Panel: Recent Labs  Lab 02/06/19 1350  NA 138  K 4.0  CL 102  CO2 27  GLUCOSE 111*  BUN 21*  CREATININE 0.60  CALCIUM 10.1   GFR: Estimated Creatinine Clearance: 75.1 mL/min (by C-G formula based on SCr of 0.6 mg/dL). Liver Function Tests: Recent Labs  Lab 02/06/19 1350  AST 25  ALT 29  ALKPHOS 61  BILITOT 0.4  PROT 6.8  ALBUMIN 4.2   No results for input(s): LIPASE, AMYLASE in the  last 168 hours. No results for input(s): AMMONIA in the last 168 hours. Coagulation Profile: No results for input(s): INR, PROTIME in the last 168 hours. Cardiac Enzymes: No results for input(s): CKTOTAL, CKMB, CKMBINDEX, TROPONINI in the last 168 hours. BNP (last 3 results) No results for input(s): PROBNP in the last 8760 hours. HbA1C: No results for input(s): HGBA1C in the last 72 hours. CBG: No results for input(s): GLUCAP in the last 168 hours. Lipid Profile: No results for input(s): CHOL, HDL, LDLCALC, TRIG, CHOLHDL, LDLDIRECT in the last 72 hours. Thyroid Function Tests: No results for input(s): TSH, T4TOTAL, FREET4, T3FREE, THYROIDAB in the last 72 hours. Anemia Panel: No results for input(s): VITAMINB12, FOLATE, FERRITIN, TIBC, IRON, RETICCTPCT in the last 72 hours. Urine analysis:    Component Value Date/Time   COLORURINE YELLOW 02/06/2019 1424   APPEARANCEUR CLEAR 02/06/2019 1424   LABSPEC 1.011 02/06/2019 1424   PHURINE 7.0 02/06/2019 1424  GLUCOSEU NEGATIVE 02/06/2019 [place] 02/06/2019 [place] 02/06/2019 1424   BILIRUBINUR n 06/30/2016 0926   [place] 02/06/2019 [place] 02/06/2019 1424   UROBILINOGEN negative 06/30/2016 0926   UROBILINOGEN 0.2 04/02/2007 1003   NITRITE NEGATIVE 02/06/2019 [place] 02/06/2019 1424    Radiological Exams on Admission: Ct Head Wo Contrast  Result Date: 02/06/2019 CLINICAL DATA:  Dysarthria EXAM: CT HEAD WITHOUT CONTRAST TECHNIQUE: Contiguous axial images were obtained from the base of the skull through the vertex without intravenous contrast. COMPARISON:  None. FINDINGS: Brain: The ventricles are normal in size and configuration. There is no intracranial mass, hemorrhage, extra-axial fluid collection, or midline shift. There is decreased attenuation at the junction of the left basal ganglia and inferior left centrum semiovale anteriorly, with suspected  recent and potential acute infarct focally in this area. Elsewhere the brain parenchyma appears unremarkable. Vascular: No hyperdense vessel evident. There is calcification in each carotid siphon region. Skull: The bony calvarium appears intact. Sinuses/Orbits: There is mucosal thickening in the left maxillary antrum. There is mucosal thickening in several ethmoid air cells bilaterally. Other visualized paranasal sinuses are clear. Orbits appear symmetric bilaterally. Other: Mastoid air cells are clear. IMPRESSION: Decreased attenuation at the junction of the anterior basal ganglia and inferior centrum semiovale on the left, suspicious for recent and potentially acute infarct. Elsewhere brain parenchyma appears unremarkable. No mass or hemorrhage. There are foci of arterial vascular calcification. There are areas of paranasal sinus disease. Electronically Signed   By: Lowella Grip III M.D.   On: 02/06/2019 14:47   Mr Brain Wo Contrast  Result Date: 02/06/2019 CLINICAL DATA:  Acute onset of slurred speech beginning 5 days ago. Abnormal head CT. EXAM: MRI HEAD WITHOUT CONTRAST TECHNIQUE: Multiplanar, multiecho pulse sequences of the brain and surrounding structures were obtained without intravenous contrast. COMPARISON:  CT same day FINDINGS: Brain: Diffusion imaging shows acute infarction measuring approximately 2 cm in size within the left basal ganglia and radiating white matter tracts. No evidence of hemorrhage. No mass effect. No other acute finding. Elsewhere, there are mild chronic small-vessel ischemic changes of the hemispheric white matter. No hydrocephalus. No mass lesion. No extra-axial collection. Vascular: Major vessels at the base of the brain show flow. 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 No hemorrhage or mass effect. Mild chronic small-vessel ischemic  change of the hemispheric white matter elsewhere. Electronically Signed   By: Nelson Chimes M.D.   On: 02/06/2019 17:56    EKG: Independently reviewed.  Normal sinus rhythm without acute ischemic changes.  Assessment/Plan Principal Problem:   Infarction of left basal ganglia (HCC)  Judy Walter is a 48 y.o. 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   Infarction of the left basal ganglia: -Admit to telemetry -Given aspirin 325 p.o., start daily aspirin 81 mg -Start atorvastatin 40 mg daily -Obtain echocardiogram and carotid Dopplers -Check A1c and lipid panel -Continue neuro checks -PT/OT/SLP eval -Neurology consulted  History of right subsegmental PE: Seen on CTA chest in 2008.  No longer on anticoagulation.   DVT prophylaxis: Subcutaneous Lovenox Code Status: Full code, confirmed with patient Family Communication: Patient declined, she will discuss with family herself Disposition  Plan: Pending stroke work-up Consults called: Neurology Admission status: Inpatient, patient with MRI confirmed acute infarction of the left basal ganglia requiring further stroke evaluation and management.   Zada Finders MD [facility]  If 7PM-7AM, please contact night-coverage [email or web address]  02/06/2019, 9:24 PM

## 2019-02-06 NOTE — ED Notes (Signed)
ED TO INPATIENT HANDOFF REPORT  ED Nurse Name and Phone #: Benjamine Mola [phone]  S Name/Age/Gender Judy Walter 48 y.o. female Room/Bed: 042C/042C  Code Status   Code Status: Not on file  Home/SNF/Other Home Patient oriented to: self, place, time and situation Is this baseline? Yes   Triage Complete: Triage complete  Chief Complaint stroke like with speech  Triage Note Pt reports slurred speech since Saturday.   Pt reports slurred speech since Saturday.    Allergies Allergies  Allergen Reactions  . Sulfonamide Derivatives     REACTION: fever    Level of Care/Admitting Diagnosis ED Disposition    ED Disposition Condition [facility] Area: [place] [[id number]]  Level of Care: Telemetry Medical [104]  Covid Evaluation: Confirmed COVID Negative  Diagnosis: Infarction of left basal ganglia [facility]) [[id number]]  Admitting Physician: Lenore Cordia [[id number]]  Attending Physician: Lenore Cordia [[id number]]  Estimated length of stay: past midnight tomorrow  Certification:: I certify this patient will need inpatient services for at least 2 midnights  PT Class (Do Not Modify): Inpatient [101]  PT Acc Code (Do Not Modify): Private [1]       B Medical/Surgery History Past Medical History:  Diagnosis Date  . Abnormal Pap smear of cervix 06/2015   ASCUS   . Cervical dysplasia 2017   colposcopic biopsy  . Osteopenia 08/2014  . Personal history of venous thrombosis and embolism    Past Surgical History:  Procedure Laterality Date  . C-sections  2002, 2008  . DILATION AND CURETTAGE OF UTERUS  1997  . wisdom teeth extracted       A IV Location/Drains/Wounds Patient Lines/Drains/Airways Status   Active Line/Drains/Airways    None          Intake/Output Last 24 hours No intake or output data in the 24 hours ending 02/06/19 1906  Labs/Imaging Results for orders placed or performed during the hospital encounter of 02/06/19  (from the past 48 hour(s))  CBC with Differential     Status: None   Collection Time: 02/06/19  1:50 PM  Result Value Ref Range   WBC 5.4 4.0 - 10.5 K/uL   RBC 4.59 3.87 - 5.11 MIL/uL   Hemoglobin 14.3 12.0 - 15.0 g/dL   HCT 43.4 36.0 - 46.0 %   MCV 94.6 80.0 - 100.0 fL   MCH 31.2 26.0 - 34.0 pg   MCHC 32.9 30.0 - 36.0 g/dL   RDW 12.5 11.5 - 15.5 %   Platelets 226 150 - 400 K/uL   nRBC 0.0 0.0 - 0.2 %   Neutrophils Relative % 52 %   Neutro Abs 2.8 1.7 - 7.7 K/uL   Lymphocytes Relative 35 %   Lymphs Abs 1.9 0.7 - 4.0 K/uL   Monocytes Relative 8 %   Monocytes Absolute 0.4 0.1 - 1.0 K/uL   Eosinophils Relative 4 %   Eosinophils Absolute 0.2 0.0 - 0.5 K/uL   Basophils Relative 1 %   Basophils Absolute 0.1 0.0 - 0.1 K/uL   Immature Granulocytes 0 %   Abs Immature Granulocytes 0.01 0.00 - 0.07 K/uL    Comment: Performed at [facility], [address], [place], [place] [address]  Comprehensive metabolic panel     Status: Abnormal   Collection Time: 02/06/19  1:50 PM  Result Value Ref Range   Sodium 138 135 - 145 mmol/L   Potassium 4.0 3.5 - 5.1 mmol/L   Chloride 102  98 - 111 mmol/L   CO2 27 22 - 32 mmol/L   Glucose, Bld 111 (H) 70 - 99 mg/dL   BUN 21 (H) 6 - 20 mg/dL   Creatinine, Ser 0.60 0.44 - 1.00 mg/dL   Calcium 10.1 8.9 - 10.3 mg/dL   Total Protein 6.8 6.5 - 8.1 g/dL   Albumin 4.2 3.5 - 5.0 g/dL   AST 25 15 - 41 U/L   ALT 29 0 - 44 U/L   Alkaline Phosphatase 61 38 - 126 U/L   Total Bilirubin 0.4 0.3 - 1.2 mg/dL   GFR calc non Af Amer >60 >60 mL/min   GFR calc Af Amer >60 >60 mL/min   Anion gap 9 5 - 15    Comment: Performed at [facility], [place] [address], [place], [place] [address]  SARS Coronavirus 2 (CEPHEID - Performed in [place] hospital lab), Hosp Order     Status: None   Collection Time: 02/06/19  1:54 PM  Result Value Ref Range   SARS Coronavirus 2 NEGATIVE NEGATIVE    Comment: (NOTE) If result is NEGATIVE SARS-CoV-2 target nucleic acids  are NOT DETECTED. The SARS-CoV-2 RNA is generally detectable in upper and lower  respiratory specimens during the acute phase of infection. The lowest  concentration of SARS-CoV-2 viral copies this assay can detect is 250  copies / mL. A negative result does not preclude SARS-CoV-2 infection  and should not be used as the sole basis for treatment or other  patient management decisions.  A negative result may occur with  improper specimen collection / handling, submission of specimen other  than nasopharyngeal swab, presence of viral mutation(s) within the  areas targeted by this assay, and inadequate number of viral copies  (<250 copies / mL). A negative result must be combined with clinical  observations, patient history, and epidemiological information. If result is POSITIVE SARS-CoV-2 target nucleic acids are DETECTED. The SARS-CoV-2 RNA is generally detectable in upper and lower  respiratory specimens dur ing the acute phase of infection.  Positive  results are indicative of active infection with SARS-CoV-2.  Clinical  correlation with patient history and other diagnostic information is  necessary to determine patient infection status.  Positive results do  not rule out bacterial infection or co-infection with other viruses. If result is PRESUMPTIVE POSTIVE SARS-CoV-2 nucleic acids MAY BE PRESENT.   A presumptive positive result was obtained on the submitted specimen  and confirmed on repeat testing.  While 2019 novel coronavirus  (SARS-CoV-2) nucleic acids may be present in the submitted sample  additional confirmatory testing may be necessary for epidemiological  and / or clinical management purposes  to differentiate between  SARS-CoV-2 and other Sarbecovirus currently known to infect humans.  If clinically indicated additional testing with an alternate test  methodology [phone]) is advised. The SARS-CoV-2 RNA is generally  detectable in upper and lower respiratory sp ecimens  during the acute  phase of infection. The expected result is Negative. Fact Sheet for Patients:  StrictlyIdeas.no Fact Sheet for Healthcare Providers: BankingDealers.co.za This test is not yet approved or cleared by the [place] FDA and has been authorized for detection and/or diagnosis of SARS-CoV-2 by FDA under an Emergency Use Authorization (EUA).  This EUA will remain in effect (meaning this test can be used) for the duration of the COVID-19 declaration under Section 564(b)(1) of the Act, 21 U.S.C. section 360bbb-3(b)(1), unless the authorization is terminated or revoked sooner. Performed at [facility], [address], [place], [place] [address]   Urinalysis, Routine w reflex microscopic     Status: None   Collection Time: 02/06/19  2:24 PM  Result Value Ref Range   Color, Urine YELLOW YELLOW   APPearance CLEAR CLEAR   Specific Gravity, Urine 1.011 1.005 - 1.030   pH 7.0 5.0 - 8.0   Glucose, UA NEGATIVE NEGATIVE mg/dL   Hgb urine dipstick NEGATIVE NEGATIVE   Bilirubin Urine NEGATIVE NEGATIVE   Ketones, ur NEGATIVE NEGATIVE mg/dL   Protein, ur NEGATIVE NEGATIVE mg/dL   Nitrite NEGATIVE NEGATIVE   Leukocytes,Ua NEGATIVE NEGATIVE    Comment: Performed at [facility] [address], [place], [place] [address]   Ct Head Wo Contrast  Result Date: 02/06/2019 CLINICAL DATA:  Dysarthria EXAM: CT HEAD WITHOUT CONTRAST TECHNIQUE: Contiguous axial images were obtained from the base of the skull through the vertex without intravenous contrast. COMPARISON:  None. FINDINGS: Brain: The ventricles are normal in size and configuration. There is no intracranial mass, hemorrhage, extra-axial fluid collection, or midline shift. There is decreased attenuation at the junction of the left basal ganglia and inferior left centrum semiovale anteriorly, with suspected recent and potential acute infarct focally in this area. Elsewhere the  brain parenchyma appears unremarkable. Vascular: No hyperdense vessel evident. There is calcification in each carotid siphon region. Skull: The bony calvarium appears intact. Sinuses/Orbits: There is mucosal thickening in the left maxillary antrum. There is mucosal thickening in several ethmoid air cells bilaterally. Other visualized paranasal sinuses are clear. Orbits appear symmetric bilaterally. Other: Mastoid air cells are clear. IMPRESSION: Decreased attenuation at the junction of the anterior basal ganglia and inferior centrum semiovale on the left, suspicious for recent and potentially acute infarct. Elsewhere brain parenchyma appears unremarkable. No mass or hemorrhage. There are foci of arterial vascular calcification. There are areas of paranasal sinus disease. Electronically Signed   By: Lowella Grip III M.D.   On: 02/06/2019 14:47   Mr Brain Wo Contrast  Result Date: 02/06/2019 CLINICAL DATA:  Acute onset of slurred speech beginning 5 days ago. Abnormal head CT. EXAM: MRI HEAD WITHOUT CONTRAST TECHNIQUE: Multiplanar, multiecho pulse sequences of the brain and surrounding structures were obtained without intravenous contrast. COMPARISON:  CT same day FINDINGS: Brain: Diffusion imaging shows acute infarction measuring approximately 2 cm in size within the left basal ganglia and radiating white matter tracts. No evidence of hemorrhage. No mass effect. No other acute finding. Elsewhere, there are mild chronic small-vessel ischemic changes of the hemispheric white matter. No hydrocephalus. No mass lesion. No extra-axial collection. Vascular: Major vessels at the base of the brain show flow. 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 No hemorrhage or mass effect. Mild chronic small-vessel ischemic change of the hemispheric white matter elsewhere. Electronically Signed    By: Nelson Chimes M.D.   On: 02/06/2019 17:56    Pending Labs Unresulted Labs (From admission, onward)   None      Vitals/Pain Today's Vitals   02/06/19 1327 02/06/19 1355 02/06/19 1415 02/06/19 1455  BP: (!) 132/92  121/81   Pulse: 61  (!) 59   Resp: 18  16   Temp: 98 F (36.7 C)     TempSrc: Oral     SpO2: 100%  96%   Weight:  64.9 kg    Height:  5' 4.5" (1.638 m)  PainSc:  0-No pain  0-No pain    Isolation Precautions No active isolations  Medications Medications  aspirin EC tablet 325 mg (has no administration in time range)    Mobility walks Low fall risk   Focused Assessments Neuro Assessment Handoff:  Swallow screen pass? Yes    NIH Stroke Scale ( + Modified Stroke Scale Criteria)  Interval: Initial Level of Consciousness (1a.)   : Alert, keenly responsive LOC Questions (1b. )   +: Answers both questions correctly LOC Commands (1c. )   + : Performs both tasks correctly Best Gaze (2. )  +: Normal Visual (3. )  +: No visual loss Facial Palsy (4. )    : Normal symmetrical movements Motor Arm, Left (5a. )   +: No drift Motor Arm, Right (5b. )   +: No drift Motor Leg, Left (6a. )   +: No drift Motor Leg, Right (6b. )   +: No drift Limb Ataxia (7. ): Absent Sensory (8. )   +: Normal, no sensory loss Best Language (9. )   +: Mild-to-moderate aphasia Dysarthria (10. ): Normal Extinction/Inattention (11.)   +: No Abnormality Modified SS Total  +: 1 Complete NIHSS TOTAL: 0     Neuro Assessment: Exceptions to WDL Neuro Checks:   Initial (02/06/19 1359)  Last Documented NIHSS Modified Score: 1 (02/06/19 1629) Has TPA been given? No If patient is a Neuro Trauma and patient is going to OR before floor call report to [place] nurse: [phone] or [phone]     R Recommendations: See Admitting Provider Note  Report given to:   Additional Notes:

## 2019-02-07 ENCOUNTER — Other Ambulatory Visit: Payer: Self-pay | Admitting: Physician Assistant

## 2019-02-07 ENCOUNTER — Inpatient Hospital Stay (HOSPITAL_COMMUNITY): Payer: 59

## 2019-02-07 DIAGNOSIS — I82409 Acute embolism and thrombosis of unspecified deep veins of unspecified lower extremity: Secondary | ICD-10-CM

## 2019-02-07 DIAGNOSIS — I639 Cerebral infarction, unspecified: Secondary | ICD-10-CM

## 2019-02-07 DIAGNOSIS — I6389 Other cerebral infarction: Secondary | ICD-10-CM

## 2019-02-07 DIAGNOSIS — I4891 Unspecified atrial fibrillation: Secondary | ICD-10-CM

## 2019-02-07 LAB — ECHOCARDIOGRAM COMPLETE
Height: 64.5 in
Weight: 2299.84 oz

## 2019-02-07 LAB — SEDIMENTATION RATE: Sed Rate: 8 mm/hr (ref 0–22)

## 2019-02-07 LAB — LIPID PANEL
Cholesterol: 313 mg/dL — ABNORMAL HIGH (ref 0–200)
HDL: 56 mg/dL (ref >40)
LDL Cholesterol: 221 mg/dL — ABNORMAL HIGH (ref 0–99)
Total CHOL/HDL Ratio: 5.6 RATIO
Triglycerides: 179 mg/dL — ABNORMAL HIGH (ref <150)
VLDL: 36 mg/dL (ref 0–40)

## 2019-02-07 LAB — ANTITHROMBIN III: AntiThromb III Func: 115 % (ref 75–120)

## 2019-02-07 LAB — HEMOGLOBIN A1C
Hgb A1c MFr Bld: 5.4 % (ref 4.8–5.6)
Mean Plasma Glucose: 108.28 mg/dL

## 2019-02-07 LAB — CK: Total CK: 27 U/L — ABNORMAL LOW (ref 38–234)

## 2019-02-07 LAB — HIV ANTIBODY (ROUTINE TESTING W REFLEX): HIV Screen 4th Generation wRfx: NONREACTIVE

## 2019-02-07 MED ORDER — CLOPIDOGREL BISULFATE 75 MG PO TABS
75.0000 mg | ORAL_TABLET | Freq: Every day | ORAL | Status: DC
  Administered 2019-02-07 – 2019-02-08 (×2): 75 mg via ORAL
  Filled 2019-02-07 (×2): qty 1

## 2019-02-07 MED ORDER — VENLAFAXINE HCL ER 37.5 MG PO CP24
37.5000 mg | ORAL_CAPSULE | Freq: Every day | ORAL | Status: DC
  Administered 2019-02-07 – 2019-02-08 (×2): 37.5 mg via ORAL
  Filled 2019-02-07 (×2): qty 1

## 2019-02-07 NOTE — Progress Notes (Signed)
  Echocardiogram 2D Echocardiogram has been performed.  Judy Walter L Androw 02/07/2019, 3:18 PM

## 2019-02-07 NOTE — Progress Notes (Addendum)
TCD bubble study and bilateral lower extremity venous duplex have been completed.  Refer to "CV Proc" under chart review to view preliminary results.  02/07/2019 2:38 PM Maudry Mayhew, MHA, RVT, RDCS, RDMS

## 2019-02-07 NOTE — Progress Notes (Signed)
Carotid duplex has been completed.   Preliminary results in CV Proc.   Abram Sander 02/07/2019 10:53 AM

## 2019-02-07 NOTE — Consult Note (Addendum)
ELECTROPHYSIOLOGY CONSULT NOTE  Patient ID: Judy Walter MRN: [id number], DOB/AGE: 04-27-1971   Admit date: 02/06/2019 Date of Consult: 02/07/2019  Primary Physician: Glenda Chroman, MD Primary Cardiologist: Dr. Harl Bowie Reason for Consultation: Cryptogenic stroke ; recommendations regarding Implantable Loop Recorder, requested by Dr. Leonie Man  History of Present Illness Judy Walter. was admitted on 02/06/2019 with difficulty with speech.  They first developed symptoms a a few days ago though attributed it to a new generic formulation of her effexor, feeling sluggish with it.  She was recommended to go to the ER by her PMD yesterday and found with stroke  PMHx include post-partum PE treated with anticoagulation for a period of time and stopped She saw Dr. [place] in 2017 with c/o palpitations, dizziness, SOB that resolved with reduction in caffeine intake so no w/u was pursued. Imaging demonstrated 2 cm acute infarction in the left basal ganglia and radiating white matter tracts.  she has undergone workup for stroke including echocardiogram and carotid dopplers.  The patient has been monitored on telemetry which has demonstrated sinus rhythm with no arrhythmias.  Inpatient stroke work-up will not include TEE given COVID19 restrictions.  She has had a negative TCD study and negative LE venous US for DVT.  She has remote h/o post-partum PE, and neurology is planning hypercoagulable labs, though Dr. Leonie Man has asked for rhythm monitoring of EP discretion  Echocardiogram this admission demonstrated  Is completed and pending   Lab work is reviewed.  Prior to admission, the patient denies chest pain, shortness of breath, dizziness, syncope.  She does have occasional palpitations. They are recovering from their stroke with disposition plans pending   Past Medical History:  Diagnosis Date   Abnormal Pap smear of cervix 06/2015   ASCUS    Cervical dysplasia 2017   colposcopic biopsy     Osteopenia 08/2014   Personal history of venous thrombosis and embolism      Surgical History:  Past Surgical History:  Procedure Laterality Date   C-sections  2002, 2008   DILATION AND CURETTAGE OF UTERUS  1997   wisdom teeth extracted       Medications Prior to Admission  Medication Sig Dispense Refill Last Dose   Cholecalciferol (VITAMIN D3) 5000 UNITS TABS Take 1 tablet by mouth daily.   Taking   ibuprofen (ADVIL,MOTRIN) 200 MG tablet Take 400 mg by mouth 2 (two) times daily as needed for headache or moderate pain.    Taking   KRILL OIL PO Take by mouth daily.   Taking   levonorgestrel (MIRENA) 20 MCG/24HR IUD 1 each by Intrauterine route once.   Taking   MAGNESIUM PO Take by mouth daily.   Taking   Polyethyl Glycol-Propyl Glycol (SYSTANE OP) Apply 1-2 drops to eye 3 (three) times daily as needed (dry eyes).   Taking   POTASSIUM PO Take by mouth daily. Powder   Taking   venlafaxine XR (EFFEXOR XR) 37.5 MG 24 hr capsule Take 1 capsule (37.5 mg total) by mouth daily. 30 capsule 1     Inpatient Medications:   aspirin EC  81 mg Oral Daily   atorvastatin  40 mg Oral q1800   enoxaparin (LOVENOX) injection  40 mg Subcutaneous Q24H    Allergies:  Allergies  Allergen Reactions   Sulfonamide Derivatives     REACTION: fever    Social History   Socioeconomic History   Marital status: Married    Spouse name: Not on file   Number  of children: Not on file   Years of education: Not on file   Highest education level: Not on file  Occupational History   Not on file  Social Needs   Financial resource strain: Not on file   Food insecurity:    Worry: Not on file    Inability: Not on file   Transportation needs:    Medical: Not on file    Non-medical: Not on file  Tobacco Use   Smoking status: Never Smoker   Smokeless tobacco: Never Used  Substance and Sexual Activity   Alcohol use: No    Alcohol/week: 0.0 standard drinks    Comment: rare wine    Drug use: No   Sexual activity: Yes    Partners: Male    Birth control/protection: I.U.D.    Comment: Mirena inserted 11-10-13   Lifestyle   Physical activity:    Days per week: Not on file    Minutes per session: Not on file   Stress: Not on file  Relationships   Social connections:    Talks on phone: Not on file    Gets together: Not on file    Attends religious service: Not on file    Active member of club or organization: Not on file    Attends meetings of clubs or organizations: Not on file    Relationship status: Not on file   Intimate partner violence:    Fear of current or ex partner: Not on file    Emotionally abused: Not on file    Physically abused: Not on file    Forced sexual activity: Not on file  Other Topics Concern   Not on file  Social History Narrative   Full time...RN...orthopedic floor. Regularly exercises- walks.      Family History  Problem Relation Age of Onset   Coronary artery disease Father    Hypertension Father    Hemochromatosis Father    Cancer Father        prostate   Coronary artery disease Paternal Grandmother    Diabetes Paternal Grandmother    Hypertension Paternal Grandmother    Breast cancer Maternal Grandmother    Osteoporosis Maternal Grandmother    Alzheimer's disease Maternal Grandmother       Review of Systems: All other systems reviewed and are otherwise negative except as noted above.  Physical Exam: Vitals:   02/07/19 0202 02/07/19 0430 02/07/19 0806 02/07/19 1213  BP: 105/83 111/76 101/75 (!) 157/88  Pulse: (!) 51 (!) 57  92  Resp: 14 14  18   Temp: 97.9 F (36.6 C) 97.8 F (36.6 C) 98.1 F (36.7 C)   TempSrc: Oral Oral Oral   SpO2: 95% 95%  97%  Weight:      Height:       Limited visual exam given COVID19 GEN- The patient is well appearing, alert and oriented x 3 today.   Head- normocephalic, atraumatic Eyes-  Sclera clear, conjunctiva pink Ears- hearing intact Neck- supple Lungs-  normal WOB Heart- SR on telemetry  MS- no obvious deformity or atrophy Psych- euthymic mood, full affect   Labs:   Lab Results  Component Value Date   WBC 5.4 02/06/2019   HGB 14.3 02/06/2019   HCT 43.4 02/06/2019   MCV 94.6 02/06/2019   PLT 226 02/06/2019    Recent Labs  Lab 02/06/19 1350  NA 138  K 4.0  CL 102  CO2 27  BUN 21*  CREATININE 0.60  CALCIUM 10.1  PROT  6.8  BILITOT 0.4  ALKPHOS 61  ALT 29  AST 25  GLUCOSE 111*   Lab Results  Component Value Date   CKTOTAL 27 (L) 02/07/2019   CKMB 2.8 04/02/2007   TROPONINI 0.03        NO INDICATION OF MYOCARDIAL INJURY. 04/02/2007   Lab Results  Component Value Date   CHOL 313 (H) 02/07/2019   Lab Results  Component Value Date   HDL 56 02/07/2019   Lab Results  Component Value Date   LDLCALC 221 (H) 02/07/2019   Lab Results  Component Value Date   TRIG 179 (H) 02/07/2019   Lab Results  Component Value Date   CHOLHDL 5.6 02/07/2019   No results found for: LDLDIRECT  No results found for: DDIMER   Radiology/Studies:   Ct Head Wo Contrast Result Date: 02/06/2019 CLINICAL DATA:  Dysarthria EXAM: CT HEAD WITHOUT CONTRAST TECHNIQUE: Contiguous axial images were obtained from the base of the skull through the vertex without intravenous contrast. COMPARISON:  None. FINDINGS: Brain: The ventricles are normal in size and configuration. There is no intracranial mass, hemorrhage, extra-axial fluid collection, or midline shift. There is decreased attenuation at the junction of the left basal ganglia and inferior left centrum semiovale anteriorly, with suspected recent and potential acute infarct focally in this area. Elsewhere the brain parenchyma appears unremarkable. Vascular: No hyperdense vessel evident. There is calcification in each carotid siphon region. Skull: The bony calvarium appears intact. Sinuses/Orbits: There is mucosal thickening in the left maxillary antrum. There is mucosal thickening in several  ethmoid air cells bilaterally. Other visualized paranasal sinuses are clear. Orbits appear symmetric bilaterally. Other: Mastoid air cells are clear. IMPRESSION: Decreased attenuation at the junction of the anterior basal ganglia and inferior centrum semiovale on the left, suspicious for recent and potentially acute infarct. Elsewhere brain parenchyma appears unremarkable. No mass or hemorrhage. There are foci of arterial vascular calcification. There are areas of paranasal sinus disease. Electronically Signed   By: Lowella Grip III M.D.   On: 02/06/2019 14:47     Mr Brain Wo Contrast Result Date: 02/06/2019 CLINICAL DATA:  Acute onset of slurred speech beginning 5 days ago. Abnormal head CT. EXAM: MRI HEAD WITHOUT CONTRAST TECHNIQUE: Multiplanar, multiecho pulse sequences of the brain and surrounding structures were obtained without intravenous contrast. COMPARISON:  CT same day FINDINGS: Brain: Diffusion imaging shows acute infarction measuring approximately 2 cm in size within the left basal ganglia and radiating white matter tracts. No evidence of hemorrhage. No mass effect. No other acute finding. Elsewhere, there are mild chronic small-vessel ischemic changes of the hemispheric white matter. No hydrocephalus. No mass lesion. No extra-axial collection. Vascular: Major vessels at the base of the brain show flow. 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 No hemorrhage or mass effect. Mild chronic small-vessel ischemic change of the hemispheric white matter elsewhere. Electronically Signed   By: Nelson Chimes M.D.   On: 02/06/2019 17:56    Vas [place] Transcranial Doppler W Bubbles Result Date: 02/07/2019  Transcranial Doppler with Bubble Indications: Stroke. Performing Technologist: Maudry Mayhew MHA, RDMS, RVT, RDCS  Examination Guidelines: A complete evaluation  includes B-mode imaging, spectral Doppler, color Doppler, and power Doppler as needed of all accessible portions of each vessel. Bilateral testing is considered an integral part of a complete examination. Limited examinations for reoccurring indications may be performed as noted.  Summary:  A vascular evaluation was performed. The right middle cerebral artery was studied. An IV was inserted into the patient's right forearm. Verbal informed consent was obtained.  There is no evidence of high intensity transient signals (HITS) at rest or with Valsalva. Therefore, there is no evidence of patent foramen ovale (PFO). *See table(s) above for measurements and observations.    Preliminary     Vas US Carotid (at [place] Only) Result Date: 02/07/2019 Carotid Arterial Duplex Study Indications:  CVA. Risk Factors: Hypertension, Diabetes. Performing Technologist: Abram Sander RVS  Examination Guidelines: A complete evaluation includes B-mode imaging, spectral Doppler, color Doppler, and power Doppler as needed of all accessible portions of each vessel. Bilateral testing is considered an integral part of a complete examination. Limited examinations for reoccurring indications may be performed as noted.  Right Carotid Findings: Summary: Right Carotid: Velocities in the right ICA are consistent with a 1-39% stenosis. Left Carotid: Velocities in the left ICA are consistent with a 1-39% stenosis. Vertebrals: Bilateral vertebral arteries demonstrate antegrade flow. *See table(s) above for measurements and observations.  Electronically signed by Antony Contras MD on 02/07/2019 at 1:28:47 PM.    Final     Vas [place] Lower Extremity Venous (dvt) Result Date: 02/07/2019  Lower Venous Study Indications: Stroke.  Risk Factors: History of PE. Performing Technologist: Maudry Mayhew MHA, RDMS, RVT, RDCS  Examination Guidelines: A complete evaluation includes B-mode imaging, spectral Doppler, color Doppler, and power Doppler as needed  of all accessible portions of each vessel. Bilateral testing is considered an integral part of a complete examination. Limited examinations for reoccurring indications may be performed as noted.Summary: Right: There is no evidence of deep vein thrombosis in the lower extremity. No cystic structure found in the popliteal fossa. Left: There is no evidence of deep vein thrombosis in the lower extremity. No cystic structure found in the popliteal fossa.  *See table(s) above for measurements and observations.    Preliminary     12-lead ECG SR All prior EKG's in EPIC reviewed with no documented atrial fibrillation  Telemetry SR  Assessment and Plan:  1. Cryptogenic stroke The patient hs occasional palpitations, some months once, infrequently a few times a month and can have months with none.  Dr. Lovena Le discussed at length rational for AFib surveillance (she is a working Marine scientist and understands), and options of event monitoring and loop implant.  At this time she is more comfortable with event monitoring.  I have ordered the monitor to be mailed to her, and follow up with Dr. Lovena Le.  If the monitor is unrevealing and her hypercoagulable labs are negative will revisit loop pimplant  Baldwin [place], PA-C 02/07/2019   EP attending  Patient seen and examined.  Agree with the findings as noted above.  She has had a cryptogenic stroke.  She has had a marked improvement in her functional status.  The patient does note a prior history of palpitations.  She is undergoing work-up for a hyper coagulable state.  I discussed the option of placement of an implantable loop recorder versus the wearing of a 14-day cardiac monitor.  The risks and benefits, pros and cons of both approaches were reviewed.  The patient would like to start out wearing the cardiac monitor.  She notes that she has 1 or 2 episodes of palpitations monthly.  If the patient does not have a diagnosis made by wearing her cardiac monitor, then we  would consider insertion of implantable loop recorder as an outpatient.  Cristopher Peru, MD

## 2019-02-07 NOTE — Progress Notes (Signed)
STROKE TEAM PROGRESS NOTE   HISTORY OF PRESENT ILLNESS (per record) Judy Walter. is an 48 y.o. female with PMH of PE and venous thrombosis, HTN. On 5/28 she presents with several days of difficulty communicating. Seems that onset was ~1 week ago PTA. 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 She denied F/C, N/V, and upper respiratory symptoms. Also no headache, vision loss, dysphagia, facial droop, neck pain, CP, SOB, abdominal pain, vertigo, diarrhea, urinary incontinence or neakness/numbness or incoordination of limbs.  SUBJECTIVE (INTERVAL HISTORY) I have reviewed pts scan with her at the bedside. She is having trouble with comprehension and speech is slow, sparse. Otherwise she has no notable focal deficits.  He has remote history of pulmonary embolism as well as 1 miscarriage.  She denies using birth control pills, smoking and lacks any traditional stroke risk factor  OBJECTIVE Vitals:   02/07/19 0002 02/07/19 0202 02/07/19 0430 02/07/19 0806  BP: 113/82 105/83 111/76 101/75  Pulse: (!) 54 (!) 51 (!) 57   Resp: 16 14 14    Temp: 98 F (36.7 C) 97.9 F (36.6 C) 97.8 F (36.6 C) 98.1 F (36.7 C)  TempSrc: Oral Oral Oral Oral  SpO2: 95% 95% 95%   Weight:      Height:        CBC:  Recent Labs  Lab 02/06/19 1350  WBC 5.4  NEUTROABS 2.8  HGB 14.3  HCT 43.4  MCV 94.6  PLT 630    Basic Metabolic Panel:  Recent Labs  Lab 02/06/19 1350  NA 138  K 4.0  CL 102  CO2 27  GLUCOSE 111*  BUN 21*  CREATININE 0.60  CALCIUM 10.1    Lipid Panel:     Component Value Date/Time   CHOL 313 (H) 02/07/2019 1043   TRIG 179 (H) 02/07/2019 1043   HDL 56 02/07/2019 1043   CHOLHDL 5.6 02/07/2019 1043   VLDL 36 02/07/2019 1043   LDLCALC 221 (H) 02/07/2019 1043   HgbA1c:  Lab Results  Component Value Date   HGBA1C 5.4 02/07/2019   Urine Drug Screen: No results found for: LABOPIA, COCAINSCRNUR, LABBENZ, AMPHETMU, THCU, LABBARB  Alcohol Level No results found for:  ETH  IMAGING   Ct Head Wo Contrast  Result Date: 02/06/2019 CLINICAL DATA:  Dysarthria EXAM: CT HEAD WITHOUT CONTRAST TECHNIQUE: Contiguous axial images were obtained from the base of the skull through the vertex without intravenous contrast. COMPARISON:  None. FINDINGS: Brain: The ventricles are normal in size and configuration. There is no intracranial mass, hemorrhage, extra-axial fluid collection, or midline shift. There is decreased attenuation at the junction of the left basal ganglia and inferior left centrum semiovale anteriorly, with suspected recent and potential acute infarct focally in this area. Elsewhere the brain parenchyma appears unremarkable. Vascular: No hyperdense vessel evident. There is calcification in each carotid siphon region. Skull: The bony calvarium appears intact. Sinuses/Orbits: There is mucosal thickening in the left maxillary antrum. There is mucosal thickening in several ethmoid air cells bilaterally. Other visualized paranasal sinuses are clear. Orbits appear symmetric bilaterally. Other: Mastoid air cells are clear. IMPRESSION: Decreased attenuation at the junction of the anterior basal ganglia and inferior centrum semiovale on the left, suspicious for recent and potentially acute infarct. Elsewhere brain parenchyma appears unremarkable. No mass or hemorrhage. There are foci of arterial vascular calcification. There are areas of paranasal sinus disease. Electronically Signed   By: Lowella Grip III M.D.   On: 02/06/2019 14:47   Mr  Brain Wo Contrast  Result Date: 02/06/2019 CLINICAL DATA:  Acute onset of slurred speech beginning 5 days ago. Abnormal head CT. EXAM: MRI HEAD WITHOUT CONTRAST TECHNIQUE: Multiplanar, multiecho pulse sequences of the brain and surrounding structures were obtained without intravenous contrast. COMPARISON:  CT same day FINDINGS: Brain: Diffusion imaging shows acute infarction measuring approximately 2 cm in size within the left basal  ganglia and radiating white matter tracts. No evidence of hemorrhage. No mass effect. No other acute finding. Elsewhere, there are mild chronic small-vessel ischemic changes of the hemispheric white matter. No hydrocephalus. No mass lesion. No extra-axial collection. Vascular: Major vessels at the base of the brain show flow. 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 No hemorrhage or mass effect. Mild chronic small-vessel ischemic change of the hemispheric white matter elsewhere. Electronically Signed   By: Nelson Chimes M.D.   On: 02/06/2019 17:56   Vas US Carotid (at [facility] Only)  Result Date: 02/07/2019 Carotid Arterial Duplex Study Indications:  CVA. Risk Factors: Hypertension, Diabetes. Performing Technologist: Abram Sander RVS  Examination Guidelines: A complete evaluation includes B-mode imaging, spectral Doppler, color Doppler, and power Doppler as needed of all accessible portions of each vessel. Bilateral testing is considered an integral part of a complete examination. Limited examinations for reoccurring indications may be performed as noted.  Right Carotid Findings: +----------+--------+--------+--------+------------+--------+           PSV cm/sEDV cm/sStenosisDescribe    Comments +----------+--------+--------+--------+------------+--------+ CCA Prox  70      19              heterogenous         +----------+--------+--------+--------+------------+--------+ CCA Distal70      22              heterogenous         +----------+--------+--------+--------+------------+--------+ ICA Prox  69      32      1-39%   heterogenous         +----------+--------+--------+--------+------------+--------+ ICA Distal59      25                                   +----------+--------+--------+--------+------------+--------+ ECA       55      13                                    +----------+--------+--------+--------+------------+--------+ +----------+--------+-------+--------+-------------------+           PSV cm/sEDV cmsDescribeArm Pressure (mmHG) +----------+--------+-------+--------+-------------------+ [id number]                                         +----------+--------+-------+--------+-------------------+ +---------+--------+--+--------+--+---------+ VertebralPSV cm/s34EDV cm/s14Antegrade +---------+--------+--+--------+--+---------+  Left Carotid Findings: +----------+--------+--------+--------+------------+--------+           PSV cm/sEDV cm/sStenosisDescribe    Comments +----------+--------+--------+--------+------------+--------+ CCA Prox  74      16              heterogenous         +----------+--------+--------+--------+------------+--------+ CCA Distal57      22              heterogenous         +----------+--------+--------+--------+------------+--------+  ICA Prox  65      28      1-39%   heterogenous         +----------+--------+--------+--------+------------+--------+ ICA Distal51      25                                   +----------+--------+--------+--------+------------+--------+ ECA       85      16                                   +----------+--------+--------+--------+------------+--------+ +----------+--------+--------+--------+-------------------+ SubclavianPSV cm/sEDV cm/sDescribeArm Pressure (mmHG) +----------+--------+--------+--------+-------------------+           124                                         +----------+--------+--------+--------+-------------------+ +---------+--------+--+--------+--+---------+ VertebralPSV cm/s27EDV cm/s12Antegrade +---------+--------+--+--------+--+---------+  Summary: Right Carotid: Velocities in the right ICA are consistent with a 1-39% stenosis. Left Carotid: Velocities in the left ICA are consistent with a  1-39% stenosis. Vertebrals: Bilateral vertebral arteries demonstrate antegrade flow. *See table(s) above for measurements and observations.     Preliminary      Transthoracic Echocardiogram  Normal ejection fraction 60 to 65%.  No cardiac source of embolism.  EKG -normal   PHYSICAL EXAM Blood pressure 101/75, pulse (!) 57, temperature 98.1 F (36.7 C), temperature source Oral, resp. rate 14, height 5' 4.5" (1.638 m), weight 65.2 kg, SpO2 95 %.  GEN/PSY: middle aged white woman, no acute distress. She has flat affect HEENT: Normocephalic Lungs: Respirations unlabored, lungs clear Ext: No edema, WDI  Neurologic Examination: Mental Status: Alert, oriented, thought content appropriate for most part, but she tells me she is having trouble concentrating or thinking of even simple tasks. I  Noted that she had trouble answering her cell phone while it was ringing during exam. Speech with intermittent hesitation and loss of fluency. Had difficulty repeating a phrase. No anomia today. Had difficulty with a 3-step directional command.  Cranial Nerves: II:  Visual fields intact. PERRL. III,IV, VI: No ptosis. EOMI. No nystagmus.  V,VII: No facial droop. Temp sensation equal  VIII: hearing intact to conversation IX,X: No hypophonia XI: Symmetric XII: midline tongue extension  Motor: 5/5 and symmetric proximally and distally in upper and lower extremities, No pronator drift.  Subtle positive on right with rotating fingers test. No bradykinesia noted.   Diminished fine finger movements on the right.  Orbits left over right upper extremity. Sensory: Temp and light touch intact throughout, bilaterally Deep Tendon Reflexes:  2+ bilateral brachioradialis, biceps, patellae and achilles Plantars: Right: downgoing                           Left: downgoing Cerebellar: No ataxia with FNF bilaterally.  Gait: observed ambulation for short distance in room; gait is smooth and not impared  HOME  MEDICATIONS:  Medications Prior to Admission  Medication Sig Dispense Refill  . Cholecalciferol (VITAMIN D3) 5000 UNITS TABS Take 1 tablet by mouth daily.    Marland Kitchen ibuprofen (ADVIL,MOTRIN) 200 MG tablet Take 400 mg by mouth 2 (two) times daily as needed for headache or moderate pain.     Marland Kitchen KRILL OIL PO Take by mouth daily.    Marland Kitchen  levonorgestrel (MIRENA) 20 MCG/24HR IUD 1 each by Intrauterine route once.    Marland Kitchen MAGNESIUM PO Take by mouth daily.    Vladimir Faster Glycol-Propyl Glycol (SYSTANE OP) Apply 1-2 drops to eye 3 (three) times daily as needed (dry eyes).    Marland Kitchen POTASSIUM PO Take by mouth daily. Powder    . venlafaxine XR (EFFEXOR XR) 37.5 MG 24 hr capsule Take 1 capsule (37.5 mg total) by mouth daily. 30 capsule 1      HOSPITAL MEDICATIONS:  . aspirin EC  81 mg Oral Daily  . atorvastatin  40 mg Oral q1800  . enoxaparin (LOVENOX) injection  40 mg Subcutaneous Q24H    ALLERGIES Allergies  Allergen Reactions  . Sulfonamide Derivatives     REACTION: fever    ASSESSMENT/PLAN:  48 y.o. 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 Only deficits on exam consist of mild dysphasia,cognative and aphasia. Pt does also state she freq has palpitations, but no Afib has been captured or dx.  Stroke Left BG infarct, larger than normal for lacune, suspect cardio embolic etiology, no source found thus far (wk up pending).   Resultant  Speech changes and cognitive/concentration ability  CT head low attenuation anterior BG & inferior centrum smiovale on left  MRI head - Left BG infarct; chronic small vessel ischemic chagnes  MRA head - pending  Carotid Doppler - R ICA 1-39% stenosis; L ICA 1-39% stenosis; bilat verts with antegrade flow.  2D Echo - pending; will order TCD w/bubble & ck BLE [place], since TEE not avail till Monday  [facility] Virus 2 neg  LDL - 221  HgbA1c - 5.4  UDS - n/a  VTE prophylaxis - lovenox  Diet per slp  None prior to admission, now on 81mg  ASA  daily  Patient counseled to be compliant with her antithrombotic medications  Ongoing aggressive stroke risk factor management  Therapy recommendations:  pending  Disposition:  Pending  Hypertension  Stable . Permissive hypertension (OK if < 220/120) but gradually normalize in 5-7 days . Long-term BP goal normotensive  Hyperlipidemia  Lipid lowering medication PTA:  Krill Oil  LDL 221, goal < 70  Current lipid lowering medication:Lipitor 40mg  daily  Continue statin at discharge  No dx of Diabetes  Other Stroke Risk Factors  History of venous thrombosis and embolism  PE and miscarriage in past  Recommend hypercoagulation wk up and genetic testing given her h/o clotting in past  Hospital day # 1  Desiree Metzger-Cihelka, ARNP-C, ANVP-BC Pager: [phone]  I have personally obtained history,examined this patient, reviewed notes, independently viewed imaging studies, participated in medical decision making and plan of care.ROS completed by me personally and pertinent positives fully documented  I have made any additions or clarifications directly to the above note. Agree with note above.  She presented with subtle cognitive difficulties for a week from a large left basal ganglia infarct likely of cryptogenic etiology given lack of traditional risk factors.  TCD bubble study was performed at the bedside and is negative for PFO.  She does have a remote history of pulmonary embolism and 1 miscarriage hence we will check hypercoagulable panel labs.  Check lower extremity venous Dopplers for DVT.  Recommend aspirin and Plavix for 3 weeks followed by aspirin alone. D/w Dr  Follow-up as an outpatient in the stroke clinic.  Cardiology consult for prolonged cardiac monitoring.  May consider possible participation in the [place] trial for cryptogenic stroke if interested I have spent a total  of   35 minutes with the patient reviewing hospital notes,  test results, labs and examining the  patient as well as establishing an assessment and plan that was discussed personally with the patient.  > 50% of time was spent in direct patient care.      Antony Contras, MD Medical Director [place] Pager: [phone] 02/07/2019 5:28 PM  To contact Stroke Continuity provider, please refer to [email or web address] After hours, contact General Neurology

## 2019-02-07 NOTE — Progress Notes (Signed)
[facility] PROGRESS NOTE  Judy Walter NLZ:[id number] DOB: June 26, 1971 DOA: 02/06/2019 PCP: Glenda Chroman, MD  Assessment/Plan: 1. Small subacute left basal ganglia infarct with mild speech deficit (hesitation, slight decrease in fluency), suspect cryptogenic etiology however patient does have prior history of PE (2008, after pregnancy previously on warfarin for 6 months).  Will evaluate with venous duplex and hypercoagulable work-up.  Continue stroke follow-up protocol with MRI brain, TTE to evaluate for PFO, carotid ultrasound, lipid panel.  Speech recommends outpatient SLP for continued speech-language services.  Continue aspirin Plavix for 3 weeks followed by aspirin alone if work-up is negative for clot otherwise will start Eliquis.  Continue aspirin and statin for now. Cardiology plan to provide with event monitor given reports of palpitations 2. History of PE (2008, after pregnancy).  Treated with 6 months of warfarin.  No recurrent episodes.  Given above diagnosis will rule out any clots with venous duplex.    Code Status: FULL  Family Communication: no family at bedside (indicate person spoken with, relationship, and if by phone, the number) Disposition Plan: monitor overnight, expect Dc in am   Consultants:  Neurology, Cardiology  Procedures:  TTE, venous duplex  Antibiotics:  none (indicate start date, and stop date if known)  HPI/Subjective:  Judy Walter is a 48 y.o. 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  who presented on 02/06/2019 with slurred speech starting on 02/03/2019 and was found to have acute left basal ganglia infarct of unclear etiology  No new weakness Hopeful to get home today  Objective: Vitals:   02/07/19 0806 02/07/19 1213  BP: 101/75 (!) 157/88  Pulse:  92  Resp:  18  Temp: 98.1 F (36.7 C)   SpO2:  97%    Intake/Output Summary (Last 24 hours) at 02/07/2019 1214 Last  data filed at 02/07/2019 1000 Gross per 24 hour  Intake 60 ml  Output -  Net 60 ml   Filed Weights   02/06/19 1355 02/06/19 1955  Weight: 64.9 kg 65.2 kg    Exam:   General: Middle-aged female, sitting in bed, no distress  Cardiovascular: Regular rate and rhythm, no edema  Respiratory: Normal effort on room air  Abdomen: Soft, nontender, normal bowel sounds  Musculoskeletal: Normal range of motion  Skin no rashes or lesions  Neurologic some slight hesitation and minimal hesitation with speaking, no appreciable facial droop, strength intact bilaterally  Data Reviewed: Basic Metabolic Panel: Recent Labs  Lab 02/06/19 1350  NA 138  K 4.0  CL 102  CO2 27  GLUCOSE 111*  BUN 21*  CREATININE 0.60  CALCIUM 10.1   Liver Function Tests: Recent Labs  Lab 02/06/19 1350  AST 25  ALT 29  ALKPHOS 61  BILITOT 0.4  PROT 6.8  ALBUMIN 4.2   No results for input(s): LIPASE, AMYLASE in the last 168 hours. No results for input(s): AMMONIA in the last 168 hours. CBC: Recent Labs  Lab 02/06/19 1350  WBC 5.4  NEUTROABS 2.8  HGB 14.3  HCT 43.4  MCV 94.6  PLT 226   Cardiac Enzymes: Recent Labs  Lab 02/07/19 1043  CKTOTAL 27*   BNP (last 3 results) No results for input(s): BNP in the last 8760 hours.  ProBNP (last 3 results) No results for input(s): PROBNP in the last 8760 hours.  CBG: No results for input(s): GLUCAP in the last 168 hours.  Recent Results (from the past 240 hour(s))  SARS  Coronavirus 2 (CEPHEID - Performed in [facility] lab), Hosp Order     Status: None   Collection Time: 02/06/19  1:54 PM  Result Value Ref Range Status   SARS Coronavirus 2 NEGATIVE NEGATIVE Final    Comment: (NOTE) If result is NEGATIVE SARS-CoV-2 target nucleic acids are NOT DETECTED. The SARS-CoV-2 RNA is generally detectable in upper and lower  respiratory specimens during the acute phase of infection. The lowest  concentration of SARS-CoV-2 viral copies  this assay can detect is 250  copies / mL. A negative result does not preclude SARS-CoV-2 infection  and should not be used as the sole basis for treatment or other  patient management decisions.  A negative result may occur with  improper specimen collection / handling, submission of specimen other  than nasopharyngeal swab, presence of viral mutation(s) within the  areas targeted by this assay, and inadequate number of viral copies  (<250 copies / mL). A negative result must be combined with clinical  observations, patient history, and epidemiological information. If result is POSITIVE SARS-CoV-2 target nucleic acids are DETECTED. The SARS-CoV-2 RNA is generally detectable in upper and lower  respiratory specimens dur ing the acute phase of infection.  Positive  results are indicative of active infection with SARS-CoV-2.  Clinical  correlation with patient history and other diagnostic information is  necessary to determine patient infection status.  Positive results do  not rule out bacterial infection or co-infection with other viruses. If result is PRESUMPTIVE POSTIVE SARS-CoV-2 nucleic acids MAY BE PRESENT.   A presumptive positive result was obtained on the submitted specimen  and confirmed on repeat testing.  While 2019 novel coronavirus  (SARS-CoV-2) nucleic acids may be present in the submitted sample  additional confirmatory testing may be necessary for epidemiological  and / or clinical management purposes  to differentiate between  SARS-CoV-2 and other Sarbecovirus currently known to infect humans.  If clinically indicated additional testing with an alternate test  methodology [phone]) is advised. The SARS-CoV-2 RNA is generally  detectable in upper and lower respiratory sp ecimens during the acute  phase of infection. The expected result is Negative. Fact Sheet for Patients:  StrictlyIdeas.no Fact Sheet for Healthcare  Providers: BankingDealers.co.za This test is not yet approved or cleared by the [place] FDA and has been authorized for detection and/or diagnosis of SARS-CoV-2 by FDA under an Emergency Use Authorization (EUA).  This EUA will remain in effect (meaning this test can be used) for the duration of the COVID-19 declaration under Section 564(b)(1) of the Act, 21 U.S.C. section 360bbb-3(b)(1), unless the authorization is terminated or revoked sooner. Performed at [facility], [address], [place], [place] [address]      Studies: Ct Head Wo Contrast  Result Date: 02/06/2019 CLINICAL DATA:  Dysarthria EXAM: CT HEAD WITHOUT CONTRAST TECHNIQUE: Contiguous axial images were obtained from the base of the skull through the vertex without intravenous contrast. COMPARISON:  None. FINDINGS: Brain: The ventricles are normal in size and configuration. There is no intracranial mass, hemorrhage, extra-axial fluid collection, or midline shift. There is decreased attenuation at the junction of the left basal ganglia and inferior left centrum semiovale anteriorly, with suspected recent and potential acute infarct focally in this area. Elsewhere the brain parenchyma appears unremarkable. Vascular: No hyperdense vessel evident. There is calcification in each carotid siphon region. Skull: The bony calvarium appears intact. Sinuses/Orbits: There is mucosal thickening in the left maxillary antrum. There is mucosal thickening in  several ethmoid air cells bilaterally. Other visualized paranasal sinuses are clear. Orbits appear symmetric bilaterally. Other: Mastoid air cells are clear. IMPRESSION: Decreased attenuation at the junction of the anterior basal ganglia and inferior centrum semiovale on the left, suspicious for recent and potentially acute infarct. Elsewhere brain parenchyma appears unremarkable. No mass or hemorrhage. There are foci of arterial vascular calcification. There are  areas of paranasal sinus disease. Electronically Signed   By: Lowella Grip III M.D.   On: 02/06/2019 14:47   Mr Brain Wo Contrast  Result Date: 02/06/2019 CLINICAL DATA:  Acute onset of slurred speech beginning 5 days ago. Abnormal head CT. EXAM: MRI HEAD WITHOUT CONTRAST TECHNIQUE: Multiplanar, multiecho pulse sequences of the brain and surrounding structures were obtained without intravenous contrast. COMPARISON:  CT same day FINDINGS: Brain: Diffusion imaging shows acute infarction measuring approximately 2 cm in size within the left basal ganglia and radiating white matter tracts. No evidence of hemorrhage. No mass effect. No other acute finding. Elsewhere, there are mild chronic small-vessel ischemic changes of the hemispheric white matter. No hydrocephalus. No mass lesion. No extra-axial collection. Vascular: Major vessels at the base of the brain show flow. 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 No hemorrhage or mass effect. Mild chronic small-vessel ischemic change of the hemispheric white matter elsewhere. Electronically Signed   By: Nelson Chimes M.D.   On: 02/06/2019 17:56   Vas US Carotid (at [place] Only)  Result Date: 02/07/2019 Carotid Arterial Duplex Study Indications:  CVA. Risk Factors: Hypertension, Diabetes. Performing Technologist: Abram Sander RVS  Examination Guidelines: A complete evaluation includes B-mode imaging, spectral Doppler, color Doppler, and power Doppler as needed of all accessible portions of each vessel. Bilateral testing is considered an integral part of a complete examination. Limited examinations for reoccurring indications may be performed as noted.  Right Carotid Findings: +----------+--------+--------+--------+------------+--------+           PSV cm/sEDV cm/sStenosisDescribe    Comments  +----------+--------+--------+--------+------------+--------+ CCA Prox  70      19              heterogenous         +----------+--------+--------+--------+------------+--------+ CCA Distal70      22              heterogenous         +----------+--------+--------+--------+------------+--------+ ICA Prox  69      32      1-39%   heterogenous         +----------+--------+--------+--------+------------+--------+ ICA Distal59      25                                   +----------+--------+--------+--------+------------+--------+ ECA       55      13                                   +----------+--------+--------+--------+------------+--------+ +----------+--------+-------+--------+-------------------+           PSV cm/sEDV cmsDescribeArm Pressure (mmHG) +----------+--------+-------+--------+-------------------+ [id number]                                         +----------+--------+-------+--------+-------------------+ +---------+--------+--+--------+--+---------+  VertebralPSV cm/s34EDV cm/s14Antegrade +---------+--------+--+--------+--+---------+  Left Carotid Findings: +----------+--------+--------+--------+------------+--------+           PSV cm/sEDV cm/sStenosisDescribe    Comments +----------+--------+--------+--------+------------+--------+ CCA Prox  74      16              heterogenous         +----------+--------+--------+--------+------------+--------+ CCA Distal57      22              heterogenous         +----------+--------+--------+--------+------------+--------+ ICA Prox  65      28      1-39%   heterogenous         +----------+--------+--------+--------+------------+--------+ ICA Distal51      25                                   +----------+--------+--------+--------+------------+--------+ ECA       85      16                                   +----------+--------+--------+--------+------------+--------+  +----------+--------+--------+--------+-------------------+ SubclavianPSV cm/sEDV cm/sDescribeArm Pressure (mmHG) +----------+--------+--------+--------+-------------------+           124                                         +----------+--------+--------+--------+-------------------+ +---------+--------+--+--------+--+---------+ VertebralPSV cm/s27EDV cm/s12Antegrade +---------+--------+--+--------+--+---------+  Summary: Right Carotid: Velocities in the right ICA are consistent with a 1-39% stenosis. Left Carotid: Velocities in the left ICA are consistent with a 1-39% stenosis. Vertebrals: Bilateral vertebral arteries demonstrate antegrade flow. *See table(s) above for measurements and observations.     Preliminary     Scheduled Meds: . aspirin EC  81 mg Oral Daily  . atorvastatin  40 mg Oral q1800  . enoxaparin (LOVENOX) injection  40 mg Subcutaneous Q24H   Continuous Infusions:  Principal Problem:   Infarction of left basal ganglia ([place])      Desiree Hane  [facility]

## 2019-02-07 NOTE — Discharge Instructions (Signed)
YOUR CARDIOLOGY TEAM HAS ARRANGED FOR AN E-VISIT FOR YOUR APPOINTMENT - PLEASE REVIEW IMPORTANT INFORMATION BELOW SEVERAL DAYS PRIOR TO YOUR APPOINTMENT  Due to the recent COVID-19 pandemic, we are transitioning in-person office visits to tele-medicine visits in an effort to decrease unnecessary exposure to our patients, their families, and staff. These visits are billed to your insurance just like a normal visit is. We also encourage you to sign up for MyChart if you have not already done so. You will need a smartphone if possible. For patients that do not have this, we can still complete the visit using a regular telephone but do prefer a smartphone to enable video when possible. You may have a family member that lives with you that can help. If possible, we also ask that you have a blood pressure cuff and scale at home to measure your blood pressure, heart rate and weight prior to your scheduled appointment. Patients with clinical needs that need an in-person evaluation and testing will still be able to come to the office if absolutely necessary. If you have any questions, feel free to call our office.     YOUR PROVIDER WILL BE USING THE FOLLOWING PLATFORM TO COMPLETE YOUR VISIT: Doximity  . IF USING MYCHART - How to Download the MyChart App to Your SmartPhone   - If Apple, go to App Store and type in MyChart in the search bar and download the app. If Android, ask patient to go to Google Play Store and type in MyChart in the search bar and download the app. The app is free but as with any other app downloads, your phone may require you to verify saved payment information or Apple/Android password.  - You will need to then log into the app with your MyChart username and password, and select [facility] as your healthcare provider to link the account.  - When it is time for your visit, go to the MyChart app, find appointments, and click Begin Video Visit. Be sure to Select Allow for your device to  access the Microphone and Camera for your visit. You will then be connected, and your provider will be with you shortly.  **If you have any issues connecting or need assistance, please contact MyChart service desk [phone] ([phone])**  **If using a computer, in order to ensure the best quality for your visit, you will need to use either of the following Internet Browsers: Google Chrome or Microsoft Edge**  . IF USING DOXIMITY or DOXY.ME - The staff will give you instructions on receiving your link to join the meeting the day of your visit.      2-3 DAYS BEFORE YOUR APPOINTMENT  You will receive a telephone call from one of our [facility] team members - your caller ID may say "Unknown caller." If this is a video visit, we will walk you through how to get the video launched on your phone. We will remind you check your blood pressure, heart rate and weight prior to your scheduled appointment. If you have an Apple Watch or Kardia, please upload any pertinent ECG strips the day before or morning of your appointment to MyChart. Our staff will also make sure you have reviewed the consent and agree to move forward with your scheduled tele-health visit.     THE DAY OF YOUR APPOINTMENT  Approximately 15 minutes prior to your scheduled appointment, you will receive a telephone call from one of [facility] team - your caller ID may say "Unknown caller."    Our staff will confirm medications, vital signs for the day and any symptoms you may be experiencing. Please have this information available prior to the time of visit start. It may also be helpful for you to have a pad of paper and pen handy for any instructions given during your visit. They will also walk you through joining the smartphone meeting if this is a video visit.    CONSENT FOR TELE-HEALTH VISIT - PLEASE REVIEW  I hereby voluntarily request, consent and authorize [facility] and its employed or contracted physicians, physician  assistants, nurse practitioners or other licensed health care professionals (the Practitioner), to provide me with telemedicine health care services (the "Services") as deemed necessary by the treating Practitioner. I acknowledge and consent to receive the Services by the Practitioner via telemedicine. I understand that the telemedicine visit will involve communicating with the Practitioner through live audiovisual communication technology and the disclosure of certain medical information by electronic transmission. I acknowledge that I have been given the opportunity to request an in-person assessment or other available alternative prior to the telemedicine visit and am voluntarily participating in the telemedicine visit.  I understand that I have the right to withhold or withdraw my consent to the use of telemedicine in the course of my care at any time, without affecting my right to future care or treatment, and that the Practitioner or I may terminate the telemedicine visit at any time. I understand that I have the right to inspect all information obtained and/or recorded in the course of the telemedicine visit and may receive copies of available information for a reasonable fee.  I understand that some of the potential risks of receiving the Services via telemedicine include:  . Delay or interruption in medical evaluation due to technological equipment failure or disruption; . Information transmitted may not be sufficient (e.g. poor resolution of images) to allow for appropriate medical decision making by the Practitioner; and/or  . In rare instances, security protocols could fail, causing a breach of personal health information.  Furthermore, I acknowledge that it is my responsibility to provide information about my medical history, conditions and care that is complete and accurate to the best of my ability. I acknowledge that Practitioner's advice, recommendations, and/or decision may be based on  factors not within their control, such as incomplete or inaccurate data provided by me or distortions of diagnostic images or specimens that may result from electronic transmissions. I understand that the practice of medicine is not an exact science and that Practitioner makes no warranties or guarantees regarding treatment outcomes. I acknowledge that I will receive a copy of this consent concurrently upon execution via email to the email address I last provided but may also request a printed copy by calling the office of [facility].    I understand that my insurance will be billed for this visit.   I have read or had this consent read to me. . I understand the contents of this consent, which adequately explains the benefits and risks of the Services being provided via telemedicine.  . I have been provided ample opportunity to ask questions regarding this consent and the Services and have had my questions answered to my satisfaction. . I give my informed consent for the services to be provided through the use of telemedicine in my medical care  By participating in this telemedicine visit I agree to the above.  

## 2019-02-07 NOTE — Progress Notes (Signed)
OT Screen Note  Patient Details Name: Judy Walter MRN: [id number] DOB: [birth year]   Cancelled Treatment:    Reason Eval/Treat Not Completed: OT screened, no needs identified, will sign off(Pt independent with mobility and no physical focal deficits)  Pt ambulating in room with no assist and speaking well with light conversation. OT signing off.  Ebony Hail Harold Hedge) Marsa Aris OTR/L Acute Rehabilitation Services Pager: [phone] Office: [place] 02/07/2019, 2:57 PM

## 2019-02-07 NOTE — Evaluation (Addendum)
Physical Therapy Evaluation and Discharge Patient Details Name: Judy Walter MRN: [id number] DOB: 05/03/1971 Today's Date: 02/07/2019   History of Present Illness  Pt is a 48 y/o female admitted secondary to slurred speech. 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 PMH inlcudes osteopenia and cervical dysplasia.   Clinical Impression  Patient evaluated by Physical Therapy with no further acute PT needs identified. All education has been completed and the patient has no further questions. Pt overall steady and scored 24 on DGI indicating low fall risk. Pt reports husband can assist at d/c if needed. See below for any follow-up Physical Therapy or equipment needs. PT is signing off. Thank you for this referral. If needs change, please re-consult.      Follow Up Recommendations No PT follow up    Equipment Recommendations  None recommended by PT    Recommendations for Other Services       Precautions / Restrictions Precautions Precautions: None Restrictions Weight Bearing Restrictions: No      Mobility  Bed Mobility Overal bed mobility: Independent                Transfers Overall transfer level: Independent                  Ambulation/Gait Ambulation/Gait assistance: Independent Gait Distance (Feet): 250 Feet Assistive device: None Gait Pattern/deviations: WFL(Within Functional Limits) Gait velocity: WFL Gait velocity interpretation: >4.37 ft/sec, indicative of normal walking speed General Gait Details: Able to perform dynamic gait tasks without LOB. Gait speed WFL   Stairs Stairs: Yes Stairs assistance: Supervision Stair Management: One rail Right;No rails;Alternating pattern;Forwards Number of Stairs: 4 General stair comments: Practiced with and without rail as pt does not have rail to enter home. Overall steady with no LOB noted. Supervision for safety.   Wheelchair Mobility    Modified Rankin (Stroke Patients Only) Modified Rankin (Stroke  Patients Only) Pre-Morbid Rankin Score: No symptoms Modified Rankin: No significant disability     Balance Overall balance assessment: Modified Independent                               Standardized Balance Assessment Standardized Balance Assessment : Dynamic Gait Index   Dynamic Gait Index Level Surface: Normal Change in Gait Speed: Normal Gait with Horizontal Head Turns: Normal Gait with Vertical Head Turns: Normal Gait and Pivot Turn: Normal Step Over Obstacle: Normal Step Around Obstacles: Normal Steps: Normal Total Score: 24       Pertinent Vitals/Pain Pain Assessment: No/denies pain    Home Living Family/patient expects to be discharged to:: Private residence Living Arrangements: Spouse/significant other;Children Available Help at Discharge: Family Type of Home: House Home Access: Stairs to enter Entrance Stairs-Rails: None Technical brewer of Steps: 2 Home Layout: Two level Home Equipment: None      Prior Function Level of Independence: Independent         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   Dominant Hand: Right    Extremity/Trunk Assessment   Upper Extremity Assessment Upper Extremity Assessment: Defer to OT evaluation    Lower Extremity Assessment Lower Extremity Assessment: Overall WFL for tasks assessed    Cervical / Trunk Assessment Cervical / Trunk Assessment: Normal  Communication   Communication: No difficulties  Cognition Arousal/Alertness: Awake/alert Behavior During Therapy: WFL for tasks assessed/performed Overall Cognitive Status: Impaired/Different from baseline Area of Impairment: Memory;Attention  Current Attention Level: Sustained Memory: Decreased recall of precautions;Decreased short-term memory                General Comments General comments (skin integrity, edema, etc.): Educated about "BE FAST" Acronym in recognizing stroke symptoms.      Exercises     Assessment/Plan    PT Assessment Patent does not need any further PT services  PT Problem List         PT Treatment Interventions      PT Goals (Current goals can be found in the Care Plan section)  Acute Rehab PT Goals Patient Stated Goal: to go home PT Goal Formulation: With patient Time For Goal Achievement: 02/07/19 Potential to Achieve Goals: Good    Frequency     Barriers to discharge        Co-evaluation               AM-PAC PT "6 Clicks" Mobility  Outcome Measure Help needed turning from your back to your side while in a flat bed without using bedrails?: None Help needed moving from lying on your back to sitting on the side of a flat bed without using bedrails?: None Help needed moving to and from a bed to a chair (including a wheelchair)?: None Help needed standing up from a chair using your arms (e.g., wheelchair or bedside chair)?: None Help needed to walk in hospital room?: None Help needed climbing 3-5 steps with a railing? : None 6 Click Score: 24    End of Session Equipment Utilized During Treatment: Gait belt Activity Tolerance: Patient tolerated treatment well Patient left: in bed;with call bell/phone within reach(sitting EOB ) Nurse Communication: Mobility status PT Visit Diagnosis: Other abnormalities of gait and mobility (R26.89)    Time: 2248-2500 PT Time Calculation (min) (ACUTE ONLY): 13 min   Charges:   PT Evaluation $PT Eval Low Complexity: [place], PT, DPT  Acute Rehabilitation Services  Pager: [phone] Office: [phone]   Rudean Hitt 02/07/2019, 12:26 PM

## 2019-02-07 NOTE — Evaluation (Signed)
Speech Language Pathology Evaluation Patient Details Name: Judy Walter MRN: [id number] DOB: 1971/03/28 Today's Date: 02/07/2019 Time: 6387-5643 SLP Time Calculation (min) (ACUTE ONLY): 56 min  Problem List:  Patient Active Problem List   Diagnosis Date Noted  . Infarction of left basal ganglia ([place]) 02/06/2019  . HYPERLIPIDEMIA TYPE I / IV 04/14/2009  . PULMONARY EMBOLISM, HX OF 04/03/2009   Past Medical History:  Past Medical History:  Diagnosis Date  . Abnormal Pap smear of cervix 06/2015   ASCUS   . Cervical dysplasia 2017   colposcopic biopsy  . Osteopenia 08/2014  . Personal history of venous thrombosis and embolism    Past Surgical History:  Past Surgical History:  Procedure Laterality Date  . C-sections  2002, 2008  . DILATION AND CURETTAGE OF UTERUS  1997  . 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  Speech evaluation ordered.     Assessment / Plan / Recommendation Clinical Impression  MOCA (original) given to pt with her scoring 22/30 - however language skills likely impacted scoring.  CN exam unremarkable for swallowing/speech musculature.  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  Pt's receptive skills are largely intact for smaller amounts of information evaluated via yes/no questions and multi step directions.  She did demonstrate some difficulty retaining lengthier information eg: with sentence repetition and increased numbers of directions - likely with memory difficulty contributing.  Pt's strengths include attention, awareness and orientation.  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  Pt became tearful but expressed ability to continue during session.  SLP educated pt to findings/recommendations and advised follow up OP SLP - pt agreeable.  Thanks for allowing me to help care for this most pleasant colleague.      SLP Assessment  SLP Recommendation/Assessment: Patient needs continued Speech Lanaguage Pathology Services SLP Visit Diagnosis: Cognitive communication deficit (R41.841);Aphasia (R47.01)    Follow Up Recommendations  Outpatient SLP    Frequency and Duration min 1 x/week  1 week      SLP Evaluation Cognition  Overall Cognitive Status: Impaired/Different from baseline Arousal/Alertness: Awake/alert Orientation Level: Oriented to person;Oriented to place;Oriented to situation(not to specific date, s/c month) Attention: Sustained Sustained Attention: Appears intact Memory: Impaired Memory Impairment: Retrieval deficit(recalled 4/5 words Independently, 1/5 with multiple choice) Awareness: Appears intact(pt tearful during session when deficits become apparent) Safety/Judgment: Appears intact       Comprehension  Auditory Comprehension Overall Auditory Comprehension: Appears within functional limits for tasks assessed Yes/No Questions: Within Functional Limits Commands: Within Functional Limits Conversation: Complex Visual Recognition/Discrimination Discrimination: Within Function Limits Reading Comprehension Reading Status: Not tested    Expression Expression Primary Mode of Expression: Verbal Verbal Expression Overall Verbal Expression: Impaired Initiation: No impairment Level of Generative/Spontaneous Verbalization: Conversation Repetition: Impaired Level of Impairment: Sentence level(complex sentence level) Naming: No impairment Pragmatics: No impairment Written Expression Dominant Hand: Right Written Expression: Exceptions to WFL(Attempting to write paragraph regarding meeting her husband was difficult for patient, first sentence was  complete "I met Hedy Camara in college")   Oral / Motor  Oral Motor/Sensory Function Overall Oral Motor/Sensory Function: Within functional limits(? minimal incr facial fullness on right) Motor Speech  Overall Motor Speech: Appears within functional limits for tasks assessed Respiration: Within functional limits Phonation: Normal Resonance: Within functional limits Articulation: Within functional limitis Intelligibility: Intelligible Motor Planning: Witnin functional limits Motor Speech Errors: Not applicable   GO                    Macario Golds 02/07/2019, 10:54 AM   Luanna Salk, MS [facility] SLP Acute Rehab Services Pager [phone] Office [phone]

## 2019-02-08 ENCOUNTER — Inpatient Hospital Stay (HOSPITAL_COMMUNITY): Payer: 59

## 2019-02-08 DIAGNOSIS — Z86718 Personal history of other venous thrombosis and embolism: Secondary | ICD-10-CM

## 2019-02-08 DIAGNOSIS — E783 Hyperchylomicronemia: Secondary | ICD-10-CM

## 2019-02-08 LAB — CARDIOLIPIN ANTIBODIES, IGG, IGM, IGA
Anticardiolipin IgA: <9 APL U/mL (ref 0–11)
Anticardiolipin IgG: <9 GPL U/mL (ref 0–14)
Anticardiolipin IgM: <9 MPL U/mL (ref 0–12)

## 2019-02-08 LAB — PROTEIN S ACTIVITY: Protein S Activity: 51 % — ABNORMAL LOW (ref 63–140)

## 2019-02-08 LAB — PROTEIN S, TOTAL: Protein S Ag, Total: 114 % (ref 60–150)

## 2019-02-08 LAB — LUPUS ANTICOAGULANT PANEL
DRVVT: 36.3 s (ref 0.0–47.0)
PTT Lupus Anticoagulant: 38.9 s (ref 0.0–51.9)

## 2019-02-08 LAB — PROTEIN C ACTIVITY: Protein C Activity: 193 % — ABNORMAL HIGH (ref 73–180)

## 2019-02-08 LAB — ANA W/REFLEX IF POSITIVE: Anti Nuclear Antibody (ANA): NEGATIVE

## 2019-02-08 MED ORDER — CLOPIDOGREL BISULFATE 75 MG PO TABS: 75.0000 mg | ORAL_TABLET | Freq: Every day | ORAL | 0 refills | Status: DC

## 2019-02-08 MED ORDER — ATORVASTATIN CALCIUM 80 MG PO TABS: 80.0000 mg | ORAL_TABLET | Freq: Every day | ORAL | Status: DC

## 2019-02-08 MED ORDER — ASPIRIN 81 MG PO TBEC: 81.0000 mg | DELAYED_RELEASE_TABLET | Freq: Every day | ORAL | 0 refills | Status: AC

## 2019-02-08 MED ORDER — ATORVASTATIN CALCIUM 80 MG PO TABS: 80.0000 mg | ORAL_TABLET | Freq: Every day | ORAL | 0 refills | Status: DC

## 2019-02-08 NOTE — TOC Transition Note (Signed)
Transition of Care [facility]) - CM/SW Discharge Note   Patient Details  Name: Lynniah Janoski MRN: [id number] Date of Birth: 1971-06-06  [facility]) CM/SW Contact:  Carles Collet, RN Phone Number: 02/08/2019, 1:24 PM   Clinical Narrative:   Damaris Schooner w patient. She would like to go to outpatient SLP over [facility] Referral placed and she understands that she will be receiving call from center to schedule appointment next week.     Final next level of care: Home/Self Care Barriers to Discharge: No Barriers Identified   Patient Goals and CMS Choice        Discharge Placement                       Discharge Plan and Services                                     Social Determinants of Health (SDOH) Interventions     Readmission Risk Interventions No flowsheet data found.

## 2019-02-08 NOTE — Progress Notes (Signed)
Pt given discharge instructions and teaching. Able to verbalize understanding and teach back. No new questions or concerns. IV and tele d/c.  Patient discharged from unit by staff, husband to transport home.

## 2019-02-09 LAB — BETA-2-GLYCOPROTEIN I ABS, IGG/M/A
Beta-2 Glyco I IgG: <9 GPI IgG units (ref 0–20)
Beta-2-Glycoprotein I IgA: <9 GPI IgA units (ref 0–25)
Beta-2-Glycoprotein I IgM: <9 GPI IgM units (ref 0–32)

## 2019-02-09 LAB — PROTEIN C, TOTAL: Protein C, Total: 131 % (ref 60–150)

## 2019-02-10 ENCOUNTER — Encounter: Payer: Self-pay | Admitting: *Deleted

## 2019-02-10 ENCOUNTER — Telehealth: Payer: Self-pay | Admitting: Obstetrics and Gynecology

## 2019-02-10 ENCOUNTER — Other Ambulatory Visit: Payer: Self-pay | Admitting: *Deleted

## 2019-02-10 ENCOUNTER — Telehealth: Payer: Self-pay | Admitting: *Deleted

## 2019-02-10 DIAGNOSIS — N390 Urinary tract infection, site not specified: Secondary | ICD-10-CM | POA: Diagnosis not present

## 2019-02-10 DIAGNOSIS — Z6825 Body mass index (BMI) 25.0-25.9, adult: Secondary | ICD-10-CM | POA: Diagnosis not present

## 2019-02-10 DIAGNOSIS — Z299 Encounter for prophylactic measures, unspecified: Secondary | ICD-10-CM | POA: Diagnosis not present

## 2019-02-10 DIAGNOSIS — I639 Cerebral infarction, unspecified: Secondary | ICD-10-CM | POA: Diagnosis not present

## 2019-02-10 DIAGNOSIS — R350 Frequency of micturition: Secondary | ICD-10-CM | POA: Diagnosis not present

## 2019-02-10 LAB — HOMOCYSTEINE: Homocysteine: 3.9 umol/L (ref 0.0–14.5)

## 2019-02-10 NOTE — Telephone Encounter (Signed)
Phone call to patient in follow up to her recent hospitalization.   She is back home from the hospitalization from a recent stroke.  She has no physical deficits.  She will start to do speech therapy.   She is now going to see her PCP for a possible UTI. She has bladder pain and urinating blood.  She states she is also on blood thinners.  She did not have a catheter in place in the hospital.  I wished her well in her recovery from the stroke.  I told her I am available to help if there is anything I can do.  She expressed her appreciation.

## 2019-02-10 NOTE — Telephone Encounter (Signed)
See new phone note.  I closed this encounter.

## 2019-02-10 NOTE — Telephone Encounter (Signed)
[employer] to ship 14 day cardiac event monitor to her home.  Instructions reviewed briefly as they are included in the monitor kit.

## 2019-02-10 NOTE — Telephone Encounter (Signed)
Dr. Quincy Simmonds -any additional f/u at this time? OK to close encounter?

## 2019-02-10 NOTE — Patient Outreach (Signed)
[facility]) Care Management  02/10/2019  Judy Walter [birth year] [id number]  Transition of care call   Referral received: 02/07/19 Initial outreach: 02/10/19 Insurance: [place] Choice Plan   Subjective: Initial successful telephone call to patient's preferred (mobile) number in order to complete transition of care assessment; 2 HIPAA identifiers verified. Explained purpose of call and completed transition of care assessment.  Judy Walter states she is doing OK,  Still having difficulty with expressive aphasia, denies receptive aphasia. Says she saw her primary care provider today for symptoms of a UTI and was prescribed Levaquin and she says she is already feeling much better. She says she will have outpatient speech therapy and will see Dr Leonie Man in follow up due to diagnosis of stoke.   Her husband is assisting with her recovery.  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 Comorbidities include: hyperlipidemia and history of pulmonary embolism She was discharged to home on 02/08/19 without the need for home health services or DME.   Assessment:  Patient voices good understanding of all discharge instructions.  See transition of care flowsheet for assessment details.   Plan:  Reviewed hospital discharge diagnosis of acute basal ganglia stroke and treatment plan using hospital discharge instructions, assessing medication adherence and discussing the importance of follow up with primary care provider and specialists. No ongoing care management needs identified so will close case to [place] [facility] care management services  and route successful outreach letter with [place] [facility] pamphlet and 24 Hour Nurse Line Magnet to [place] [facility] clinical pool to be mailed to patient's home.  Barrington Ellison RN,CCM,CDE [place] Management Coordinator Office Phone [phone] Office Fax [phone]

## 2019-02-12 ENCOUNTER — Ambulatory Visit (INDEPENDENT_AMBULATORY_CARE_PROVIDER_SITE_OTHER): Payer: 59 | Admitting: Diagnostic Neuroimaging

## 2019-02-12 ENCOUNTER — Other Ambulatory Visit: Payer: Self-pay | Admitting: *Deleted

## 2019-02-12 ENCOUNTER — Telehealth: Payer: Self-pay | Admitting: Diagnostic Neuroimaging

## 2019-02-12 ENCOUNTER — Other Ambulatory Visit: Payer: Self-pay

## 2019-02-12 ENCOUNTER — Encounter: Payer: Self-pay | Admitting: Diagnostic Neuroimaging

## 2019-02-12 DIAGNOSIS — I639 Cerebral infarction, unspecified: Secondary | ICD-10-CM | POA: Diagnosis not present

## 2019-02-12 NOTE — Progress Notes (Signed)
[facility]  PATIENT: Judy Walter V. DOB: 07-Dec-1970  REFERRING CLINICIAN: Nettey HISTORY FROM: patient  REASON FOR VISIT: new consult    HISTORICAL  CHIEF COMPLAINT:  Chief Complaint  Patient presents with  . Cerebrovascular Accident    HISTORY OF PRESENT ILLNESS:   48 year old female here for evaluation of stroke.  Patient had left basal ganglia subcortical ischemic infarction approximately 2 to 3 weeks ago.  Patient was in the hospital for evaluation.  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  Patient had onset of speech and language difficulty.  She was diagnosed with stroke.  Stroke work-up was completed.  Patient was discharged on 02/08/2019.  Since that time symptoms are stable.  Still continues to have some mild word finding difficulty and speech hesitancy.  No numbness or weakness.  No vision or swallowing difficulties.  She has not received outpatient cardiac monitor yet.  She is not heard back from speech therapy for appointment.  Patient is currently written out of work until next Friday.    REVIEW OF SYSTEMS: Full 14 system review of systems performed and negative with exception of: As per HPI.  ALLERGIES: Allergies  Allergen Reactions  . Other     Darvocet- hallucinations  . Sulfonamide Derivatives     REACTION: fever    HOME MEDICATIONS: Outpatient Medications Prior to Visit  Medication Sig Dispense Refill  . aspirin EC 81 MG EC tablet Take 1 tablet (81 mg total) by mouth daily. 90 tablet 0  . atorvastatin (LIPITOR) 80 MG tablet Take 1 tablet (80 mg total) by mouth daily at 6 PM. 90 tablet 0  . Cholecalciferol (VITAMIN D3) 5000 UNITS TABS Take 1 tablet by mouth daily.    . clopidogrel (PLAVIX) 75 MG tablet Take 1 tablet (75 mg total) by mouth daily. 21 tablet 0  . ibuprofen (ADVIL,MOTRIN) 200 MG tablet Take 400 mg by mouth 2 (two) times daily as needed for headache or moderate pain.     Marland Kitchen levofloxacin (LEVAQUIN)  500 MG tablet Take 500 mg by mouth daily.    Marland Kitchen levonorgestrel (MIRENA) 20 MCG/24HR IUD 1 each by Intrauterine route once.    Marland Kitchen MAGNESIUM PO Take by mouth daily.    Vladimir Faster Glycol-Propyl Glycol (SYSTANE OP) Apply 1-2 drops to eye 3 (three) times daily as needed (dry eyes).    Marland Kitchen POTASSIUM PO Take by mouth daily. Powder    . venlafaxine XR (EFFEXOR XR) 37.5 MG 24 hr capsule Take 1 capsule (37.5 mg total) by mouth daily. 30 capsule 1   No facility-administered medications prior to visit.     PAST MEDICAL HISTORY: Past Medical History:  Diagnosis Date  . Abnormal Pap smear of cervix 06/2015   ASCUS   . Cervical dysplasia 2017   colposcopic biopsy  . Elevated cholesterol   . Leukemia [facility])    age 51, s/p chemotherapy  . Migraines   . Osteopenia 08/2014  . Personal history of venous thrombosis and embolism   . Stroke ([place]) 02/06/2019   acute left basal ganglia  . Vitamin D deficiency     PAST SURGICAL HISTORY: Past Surgical History:  Procedure Laterality Date  . C-sections  2002, 2008  . DILATION AND CURETTAGE OF UTERUS  1997  . wisdom teeth extracted      FAMILY HISTORY: Family History  Problem Relation Age of Onset  . Coronary artery disease Father   . Hypertension Father   . Hemochromatosis Father   .  Cancer Father        prostate  . Coronary artery disease Paternal Grandmother   . Diabetes Paternal Grandmother   . Hypertension Paternal Grandmother   . Breast cancer Maternal Grandmother   . Osteoporosis Maternal Grandmother   . Alzheimer's disease Maternal Grandmother     SOCIAL HISTORY: Social History   Socioeconomic History  . Marital status: Married    Spouse name: Not on file  . Number of children: Not on file  . Years of education: Not on file  . Highest education level: Not on file  Occupational History  . 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 Financial resource strain:  Not on file  . Food insecurity:    Worry: Not on file    Inability: Not on file  . Transportation needs:    Medical: Not on file    Non-medical: Not on file  Tobacco Use  . Smoking status: Never Smoker  . Smokeless tobacco: Never Used  Substance and Sexual Activity  . Alcohol use: No    Alcohol/week: 0.0 standard drinks    Comment: rare wine  . Drug use: No  . Sexual activity: Yes    Partners: Male    Birth control/protection: I.U.D.    Comment: Mirena inserted 11-10-13   Lifestyle  . Physical activity:    Days per week: Not on file    Minutes per session: Not on file  . Stress: Not on file  Relationships  . Social connections:    Talks on phone: Not on file    Gets together: Not on file    Attends religious service: Not on file    Active member of club or organization: Not on file    Attends meetings of clubs or organizations: Not on file    Relationship status: Not on file  . Intimate partner violence:    Fear of current or ex partner: Not on file    Emotionally abused: Not on file    Physically abused: Not on file    Forced sexual activity: Not on file  Other Topics Concern  . Not on file  Social History Narrative   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      PHYSICAL EXAM   VIDEO EXAM  GENERAL EXAM/CONSTITUTIONAL:  Vitals: There were no vitals filed for this visit.  There is no height or weight on file to calculate BMI. Wt Readings from Last 3 Encounters:  02/06/19 143 lb 11.8 oz (65.2 kg)  12/16/18 149 lb (67.6 kg)  11/27/18 145 lb (65.8 kg)     Patient is in no distress; well developed, nourished and groomed; neck is supple   NEUROLOGIC: MENTAL STATUS:  No flowsheet data found.  awake, alert, oriented to person, place and time  recent and remote memory intact  normal attention and concentration  DECR FLUENCY; comprehension intact, naming intact  fund of knowledge appropriate  CRANIAL NERVE:   2nd, 3rd, 4th, 6th - visual fields  full to confrontation, extraocular muscles intact, no nystagmus  5th - facial sensation symmetric  7th - facial strength symmetric  8th - hearing intact  11th - shoulder shrug symmetric  12th - tongue protrusion midline  MOTOR:   NO TREMOR; NO DRIFT IN BUE  SENSORY:   normal and symmetric to light touch  COORDINATION:   fine finger movements normal      DIAGNOSTIC DATA (LABS,  IMAGING, TESTING) - I reviewed patient records, labs, notes, testing and imaging myself where available.  Lab Results  Component Value Date   WBC 5.4 02/06/2019   HGB 14.3 02/06/2019   HCT 43.4 02/06/2019   MCV 94.6 02/06/2019   PLT 226 02/06/2019      Component Value Date/Time   NA 138 02/06/2019 1350   K 4.0 02/06/2019 1350   CL 102 02/06/2019 1350   CO2 27 02/06/2019 1350   GLUCOSE 111 (H) 02/06/2019 1350   BUN 21 (H) 02/06/2019 1350   CREATININE 0.60 02/06/2019 1350   CALCIUM 10.1 02/06/2019 1350   PROT 6.8 02/06/2019 1350   ALBUMIN 4.2 02/06/2019 1350   AST 25 02/06/2019 1350   ALT 29 02/06/2019 1350   ALKPHOS 61 02/06/2019 1350   BILITOT 0.4 02/06/2019 1350   GFRNONAA >60 02/06/2019 1350   GFRAA >60 02/06/2019 1350   Lab Results  Component Value Date   CHOL 313 (H) 02/07/2019   HDL 56 02/07/2019   LDLCALC 221 (H) 02/07/2019   TRIG 179 (H) 02/07/2019   CHOLHDL 5.6 02/07/2019   Lab Results  Component Value Date   HGBA1C 5.4 02/07/2019   Lab Results  Component Value Date   VITAMINB12 359 04/09/2007   Lab Results  Component Value Date   TSH 1.960 04/22/2018    02/06/19 MRI brain [I reviewed images myself and agree with interpretation. -VRP]  - 2 cm acute infarction in the left basal ganglia and radiating white matter tracts. No hemorrhage or mass effect. - Mild chronic small-vessel ischemic change of the hemispheric white matter elsewhere.  02/08/19 MRA head [I reviewed images myself and agree with interpretation. -VRP]  - No large or medium vessel anterior  circulation finding. More distal MCA branches do show mild atherosclerotic irregularity. 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 Mild irregularity of the more distal PCA branch vessels.  02/07/19 TTE  1. The left ventricle has normal systolic function with an ejection fraction of 60-65%. The cavity size was normal. Left ventricular diastolic parameters were normal.  2. The right ventricle has normal systolic function. The cavity was normal. There is no increase in right ventricular wall thickness.  3. Left atrial size was mildly dilated.  4. Right atrial size was mildly dilated.  5. No evidence of mitral valve stenosis.  6. No stenosis of the aortic valve.  7. The aortic root and ascending aorta are normal in size and structure.  8. The interatrial septum was not assessed.  02/07/19 TCD - There is no evidence of high intensity transient signals (HITS) at rest or with Valsalva. Therefore, there is no evidence of patent foramen ovale (PFO).  02/07/19 BLE u/s Right: There is no evidence of deep vein thrombosis in the lower extremity. No cystic structure found in the popliteal fossa. Left: There is no evidence of deep vein thrombosis in the lower extremity. No cystic structure found in the popliteal fossa.  02/07/19 carotid u/s Right Carotid: Velocities in the right ICA are consistent with a 1-39% stenosis. Left Carotid: Velocities in the left ICA are consistent with a 1-39% stenosis. Vertebrals: Bilateral vertebral arteries demonstrate antegrade flow.  Protein S Activity 63 - 140 % 51 Protein C Activity 73 - 180 % 193   ASSESSMENT AND PLAN  48 y.o. year old female here with, subcortical basilar area ischemic infarction, likely due to small vessel thrombosis.  Will complete cardioembolic/cryptogenic stroke work-up.  Dx:  1.  Left basal ganglia embolic stroke [facility])     Virtual Visit via Video Note  I connected with Alphonzo Lemmings V. on 02/12/19 at  2:30 PM  EDT by a video enabled telemedicine application and verified that I am speaking with the correct person using two identifiers.  Location: Patient: home  Provider: office   I discussed the limitations of evaluation and management by telemedicine and the availability of in person appointments. The patient expressed understanding and agreed to proceed.  I discussed the assessment and treatment plan with the patient. The patient was provided an opportunity to ask questions and all were answered. The patient agreed with the plan and demonstrated an understanding of the instructions.   The patient was advised to call back or seek an in-person evaluation if the symptoms worsen or if the condition fails to improve as anticipated.  I provided 25 minutes of non-face-to-face time during this encounter.   PLAN:  - continue aspirin and plavix x 3 weeks, then aspirin alone - continue statin - follow up cardiac monitor and ILR if needed - out of work for now until speech therapy - follow up with Dr. Leonie Man (stroke clinic)  Return in about 3 months (around 05/15/2019) for with Dr. Leonie Man.    Penni Bombard, MD 11/10/5174, 1:60 PM Certified in Neurology, Neurophysiology and Neuroimaging  [facility] [address] [place], [place] [address] [phone]

## 2019-02-12 NOTE — Telephone Encounter (Signed)
°  Due to current COVID 19 pandemic, our office is severely reducing in office visits until further notice, in order to minimize the risk to our patients and healthcare providers.    Called patient and scheduled a virtual visit with Dr. Leta Baptist  for 02/12/2019 . Patient verbalized understanding of the [email or web address] process and I have sent an e-mail to [email or web address] with link and instructions as well as my name and our office number. Patient understands that they will receive a call from RN to update chart.   Pt understands that although there may be some limitations with this type of visit, we will take all precautions to reduce any security or privacy concerns.  Pt understands that this will be treated like an in office visit and we will file with pt's insurance, and there may be a patient responsible charge related to this service.

## 2019-02-12 NOTE — Patient Outreach (Signed)
[facility]) Care Management  02/12/2019  Shabreka Coulon [birth year] [id number]   Returned call to [place] after she left message on this RNCM's office number earlier today. 2 HIPAA identifiers verified. Ryka states the purpose of her call was to requests assistance from this [facility] in securing an initial appointment with a neurologist since she had not heard from the office and her primary care provider initiated the referral on 02/10/19.Marland Kitchen Tanecia states she is anxious to establish care with the neurologist and start speech therapy because she continues to struggle with expressive aphasia. She says since she left the voice message this RNCM, the neurology office called and she has a virtual visit at 2:30 pm today. Arali expressed appreciation for the return call and she was advised to call this RNCM if she has future questions or concerns.  Barrington Ellison RN,CCM,CDE [place] Management Coordinator Office Phone [phone] Office Fax [phone]

## 2019-02-12 NOTE — Discharge Summary (Addendum)
Discharge Summary  Judy Walter VZD:[id number] DOB: 1970-10-04  PCP: Glenda Chroman, MD  Admit date: 02/06/2019 Discharge date: 02/12/2019   Time spent: < 25 minutes  Admitted From: home Disposition:  home  Recommendations for Outpatient Follow-up:  1. Follow up with PCP in 1 week 2. Medications: aspirin and plavix x 3 weeks, followed by aspirin monotherapy, atorvastatin 80 mg 3. Event monitoring arranged by cardiology 4. Hypercoagulable panel labs pending on discharge ( given history of postpartum PE and miscarriage) 5. Outpatient Speech therapyt    Discharge Diagnoses:  Active Hospital Problems   Diagnosis Date Noted   Infarction of left basal ganglia ([place]) 02/06/2019    Resolved Hospital Problems  No resolved problems to display.    Discharge Condition: Stable   CODE STATUS:FUll Code   History of present illness:  Judy Walter is a 48 y.o. 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 Remaining hospital course addressed in problem based format below:   Hospital Course:   Small subacute left basal ganglia infarct with mild speech deficit (hesitation, slight decrease in fluency).  MRI confirmed left basal ganglia infarct, MRA with no large vessel findings, severe stenosis of the right PCA p1 segment moderate stenosis of bilateral ICA (1-39%), TCD with bubble study negative for PFO, TEE is not available at this time due to [place] pandemic.  A1c 5.4, LDL 221, atorvastatin 80 mg on discharge.  Neuro recommends aspirin and Plavix for 3 weeks followed by aspirin alone, outpatient follow-up with stroke clinic arranged.Suspect cryptogenic etiology however given patient has prior history of PE (postpartum, 2008) require longer stay for evaluation venous duplex  which was negative for clot burden. History of PE (post partum, 2008, and 1 miscarriage). Venous duplex negative for clot. hypercoaguable panel labs ordered and pending, to be followed up as outpatient  Due to cryptogenic nature of stroke with reports of occasional palpitations cardiology recommended implantable loop recorder, patient opted to start wearing 14-day cardiac monitor, pending results cardiology will consider insertion of implantable loop recorder as an outpatient.   Consultations: Cardiology, neurology  Procedures/Studies:   Discharge Exam: BP 132/83    Pulse (!) 50    Temp 99.1 F (37.3 C)    Resp 16    Ht 5' 4.5" (1.638 m)    Wt 65.2 kg    SpO2 100%    BMI 24.29 kg/m    General: Middle-aged female, sitting in bed, no distress  Cardiovascular: Regular rate and rhythm, no edema  Respiratory: Normal effort on room air  Abdomen: Soft, nontender, normal bowel sounds  Musculoskeletal: Normal range of motion  Skin no rashes or lesions  Neurologic some slight hesitation with speaking, no facial droop, strength intact bilaterally   Discharge Instructions You were cared for by a hospitalist during your hospital stay. If you have any questions about your discharge medications or the care you received while you were in the hospital after you are discharged, you can call the unit and asked to speak with the hospitalist on call if the hospitalist that took care of you is not available. Once you are discharged, your primary care physician will handle any further medical issues. Please note that NO REFILLS for any discharge medications will be authorized once you are discharged, as it is imperative that you return to your  primary care physician (or establish a relationship with a primary care physician if you do not have one) for your aftercare needs so that they can reassess your need for medications and monitor your lab values.  Discharge Instructions    Ambulatory referral to  Speech Therapy   Complete by:  As directed    Diet - low sodium heart healthy   Complete by:  As directed    Increase activity slowly   Complete by:  As directed      Allergies as of 02/08/2019      Reactions   Sulfonamide Derivatives    REACTION: fever      Medication List    TAKE these medications   aspirin 81 MG EC tablet Take 1 tablet (81 mg total) by mouth daily.   atorvastatin 80 MG tablet Commonly known as:  LIPITOR Take 1 tablet (80 mg total) by mouth daily at 6 PM.   clopidogrel 75 MG tablet Commonly known as:  PLAVIX Take 1 tablet (75 mg total) by mouth daily.   ibuprofen 200 MG tablet Commonly known as:  ADVIL Take 400 mg by mouth 2 (two) times daily as needed for headache or moderate pain.   levonorgestrel 20 MCG/24HR IUD Commonly known as:  MIRENA 1 each by Intrauterine route once.   MAGNESIUM PO Take by mouth daily.   POTASSIUM PO Take by mouth daily. Powder   SYSTANE OP Apply 1-2 drops to eye 3 (three) times daily as needed (dry eyes).   venlafaxine XR 37.5 MG 24 hr capsule Commonly known as:  Effexor XR Take 1 capsule (37.5 mg total) by mouth daily.   Vitamin D3 125 MCG (5000 UT) Tabs Take 1 tablet by mouth daily.      Allergies  Allergen Reactions   Other     Darvocet- hallucinations   Sulfonamide Derivatives     REACTION: fever   Follow-up Information    [facility] Follow up.   Specialty:  Cardiology Why:  you will be mailed the heart monitor to your home, please call the office if you dont receive this in the next week or so Contact information: [address], [address] [place]       Evans Lance, MD Follow up.   Specialty:  Cardiology Why:  03/11/2019 @ 11;15AM, this is scheduled as a virtual/video visit Contact information: [address] [address] [phone]        [facility] Follow up.   Specialty:  Rehabilitation Why:  They will call you in 3-4 days to schedule appointment Contact information: [address] [place] [phone]           The results of significant diagnostics from this hospitalization (including imaging, microbiology, ancillary and laboratory) are listed below for reference.    Significant Diagnostic Studies: Ct Head Wo Contrast  Result Date: 02/06/2019 CLINICAL DATA:  Dysarthria EXAM: CT HEAD WITHOUT CONTRAST TECHNIQUE: Contiguous axial images were obtained from the base of the skull through the vertex without intravenous contrast. COMPARISON:  None. FINDINGS: Brain: The ventricles are normal in size and configuration. There is no intracranial mass, hemorrhage, extra-axial fluid collection, or midline shift. There is decreased attenuation at the junction of the left basal ganglia and inferior left centrum semiovale anteriorly, with suspected recent and potential acute infarct focally in this area. Elsewhere the brain parenchyma appears unremarkable. Vascular:  No hyperdense vessel evident. There is calcification in each carotid siphon region. Skull: The bony calvarium appears intact. Sinuses/Orbits: There is mucosal thickening in the left maxillary antrum. There is mucosal thickening in several ethmoid air cells bilaterally. Other visualized paranasal sinuses are clear. Orbits appear symmetric bilaterally. Other: Mastoid air cells are clear. IMPRESSION: Decreased attenuation at the junction of the anterior basal ganglia and inferior centrum semiovale on the left, suspicious for recent and potentially acute infarct. Elsewhere brain parenchyma appears unremarkable. No mass or hemorrhage. There are foci of arterial vascular calcification. There are areas of paranasal sinus disease. Electronically Signed   By: Lowella Grip III M.D.   On: 02/06/2019 14:47   Mr Jodene Nam Head Wo Contrast  Result  Date: 02/08/2019 CLINICAL DATA:  Slurred speech beginning about a week ago. Follow-up left basal ganglia infarction. EXAM: MRA HEAD WITHOUT CONTRAST TECHNIQUE: Angiographic images of the Circle of Willis were obtained using MRA technique without intravenous contrast. COMPARISON:  02/06/2019 FINDINGS: Both internal carotid arteries are widely patent through the skull base and siphon regions. The anterior and middle cerebral vessels are patent without proximal stenosis, aneurysm or vascular malformation. Distal vessels show only mild atherosclerotic narrowing and irregularity. Both vertebral arteries are widely patent to the basilar. No basilar stenosis. Both posterior inferior cerebellar arteries show flow. Both anterior inferior cerebellar arteries show flow. Both superior cerebellar arteries show flow. There is a severe stenosis of the right P1 segment of the posterior cerebral artery 1 cm from its origin. Distal vessels do opacify. Smaller distal branches show atherosclerotic irregularity. IMPRESSION: No large or medium vessel anterior circulation finding. More distal MCA branches do show mild atherosclerotic irregularity. Severe stenosis of the right PCA P1 segment 1 cm from its origin. Mild irregularity of the more distal PCA branch vessels. Electronically Signed   By: Nelson Chimes M.D.   On: 02/08/2019 10:50   Mr Brain Wo Contrast  Result Date: 02/06/2019 CLINICAL DATA:  Acute onset of slurred speech beginning 5 days ago. Abnormal head CT. EXAM: MRI HEAD WITHOUT CONTRAST TECHNIQUE: Multiplanar, multiecho pulse sequences of the brain and surrounding structures were obtained without intravenous contrast. COMPARISON:  CT same day FINDINGS: Brain: Diffusion imaging shows acute infarction measuring approximately 2 cm in size within the left basal ganglia and radiating white matter tracts. No evidence of hemorrhage. No mass effect. No other acute finding. Elsewhere, there are mild chronic small-vessel ischemic  changes of the hemispheric white matter. No hydrocephalus. No mass lesion. No extra-axial collection. Vascular: Major vessels at the base of the brain show flow. 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 No hemorrhage or mass effect. Mild chronic small-vessel ischemic change of the hemispheric white matter elsewhere. Electronically Signed   By: Nelson Chimes M.D.   On: 02/06/2019 17:56   Vas [place] Transcranial Doppler W Bubbles  Result Date: 02/10/2019  Transcranial Doppler with Bubble Indications: Stroke. Performing Technologist: Maudry Mayhew MHA, RDMS, RVT, RDCS  Examination Guidelines: A complete evaluation includes B-mode imaging, spectral Doppler, color Doppler, and power Doppler as needed of all accessible portions of each vessel. Bilateral testing is considered an integral part of a complete examination. Limited examinations for reoccurring indications may be performed as noted.  Summary:  A vascular evaluation was performed. The right middle cerebral artery was studied. An IV was inserted into the patient's right forearm. Verbal informed consent was obtained.  There  is no evidence of high intensity transient signals (HITS) at rest or with Valsalva. Therefore, there is no evidence of patent foramen ovale (PFO). Negative TCD Bubble study *See table(s) above for measurements and observations.  Diagnosing physician: Antony Contras MD Electronically signed by Antony Contras MD on 02/10/2019 at 12:24:14 PM.    Final    Vas US Carotid (at [place] Only)  Result Date: 02/07/2019 Carotid Arterial Duplex Study Indications:  CVA. Risk Factors: Hypertension, Diabetes. Performing Technologist: Abram Sander RVS  Examination Guidelines: A complete evaluation includes B-mode imaging, spectral Doppler, color Doppler, and power Doppler as needed of all accessible portions of each vessel.  Bilateral testing is considered an integral part of a complete examination. Limited examinations for reoccurring indications may be performed as noted.  Right Carotid Findings: +----------+--------+--------+--------+------------+--------+             PSV cm/s EDV cm/s Stenosis Describe     Comments  +----------+--------+--------+--------+------------+--------+  CCA Prox   70       19                heterogenous           +----------+--------+--------+--------+------------+--------+  CCA Distal 70       22                heterogenous           +----------+--------+--------+--------+------------+--------+  ICA Prox   69       32       1-39%    heterogenous           +----------+--------+--------+--------+------------+--------+  ICA Distal 59       25                                       +----------+--------+--------+--------+------------+--------+  ECA        55       13                                       +----------+--------+--------+--------+------------+--------+ +----------+--------+-------+--------+-------------------+             PSV cm/s EDV cms Describe Arm Pressure (mmHG)  +----------+--------+-------+--------+-------------------+  Subclavian 53                                             +----------+--------+-------+--------+-------------------+ +---------+--------+--+--------+--+---------+  Vertebral PSV cm/s 34 EDV cm/s 14 Antegrade  +---------+--------+--+--------+--+---------+  Left Carotid Findings: +----------+--------+--------+--------+------------+--------+             PSV cm/s EDV cm/s Stenosis Describe     Comments  +----------+--------+--------+--------+------------+--------+  CCA Prox   74       16                heterogenous           +----------+--------+--------+--------+------------+--------+  CCA Distal 57       22                heterogenous           +----------+--------+--------+--------+------------+--------+  ICA Prox   65       28       1-39%    heterogenous            +----------+--------+--------+--------+------------+--------+  ICA Distal 51       25                                       +----------+--------+--------+--------+------------+--------+  ECA        85       16                                       +----------+--------+--------+--------+------------+--------+ +----------+--------+--------+--------+-------------------+  Subclavian PSV cm/s EDV cm/s Describe Arm Pressure (mmHG)  +----------+--------+--------+--------+-------------------+             124                                             +----------+--------+--------+--------+-------------------+ +---------+--------+--+--------+--+---------+  Vertebral PSV cm/s 27 EDV cm/s 12 Antegrade  +---------+--------+--+--------+--+---------+  Summary: Right Carotid: Velocities in the right ICA are consistent with a 1-39% stenosis. Left Carotid: Velocities in the left ICA are consistent with a 1-39% stenosis. Vertebrals: Bilateral vertebral arteries demonstrate antegrade flow. *See table(s) above for measurements and observations.  Electronically signed by Antony Contras MD on 02/07/2019 at 1:28:47 PM.    Final    Vas [place] Lower Extremity Venous (dvt)  Result Date: 02/07/2019  Lower Venous Study Indications: Stroke.  Risk Factors: History of PE. Performing Technologist: Maudry Mayhew MHA, RDMS, RVT, RDCS  Examination Guidelines: A complete evaluation includes B-mode imaging, spectral Doppler, color Doppler, and power Doppler as needed of all accessible portions of each vessel. Bilateral testing is considered an integral part of a complete examination. Limited examinations for reoccurring indications may be performed as noted.  +---------+---------------+---------+-----------+----------+-------+  RIGHT     Compressibility Phasicity Spontaneity Properties Summary  +---------+---------------+---------+-----------+----------+-------+  CFV       Full            Yes       Yes                              +---------+---------------+---------+-----------+----------+-------+  SFJ       Full                                                      +---------+---------------+---------+-----------+----------+-------+  FV Prox   Full                                                      +---------+---------------+---------+-----------+----------+-------+  FV Mid    Full                                                      +---------+---------------+---------+-----------+----------+-------+  FV Distal Full                                                      +---------+---------------+---------+-----------+----------+-------+  PFV       Full                                                      +---------+---------------+---------+-----------+----------+-------+  POP       Full            Yes       Yes                             +---------+---------------+---------+-----------+----------+-------+  PTV       Full                                                      +---------+---------------+---------+-----------+----------+-------+  PERO      Full                                                      +---------+---------------+---------+-----------+----------+-------+   +---------+---------------+---------+-----------+----------+-------+  LEFT      Compressibility Phasicity Spontaneity Properties Summary  +---------+---------------+---------+-----------+----------+-------+  CFV       Full            Yes       Yes                             +---------+---------------+---------+-----------+----------+-------+  SFJ       Full                                                      +---------+---------------+---------+-----------+----------+-------+  FV Prox   Full                                                      +---------+---------------+---------+-----------+----------+-------+  FV Mid    Full                                                      +---------+---------------+---------+-----------+----------+-------+  FV Distal Full                                                       +---------+---------------+---------+-----------+----------+-------+  PFV       Full                                                      +---------+---------------+---------+-----------+----------+-------+  POP       Full            Yes       Yes                             +---------+---------------+---------+-----------+----------+-------+  PTV       Full                                                      +---------+---------------+---------+-----------+----------+-------+  PERO      Full                                                      +---------+---------------+---------+-----------+----------+-------+     Summary: Right: There is no evidence of deep vein thrombosis in the lower extremity. No cystic structure found in the popliteal fossa. Left: There is no evidence of deep vein thrombosis in the lower extremity. No cystic structure found in the popliteal fossa.  *See table(s) above for measurements and observations. Electronically signed by Monica Martinez MD on 02/07/2019 at 5:28:26 PM.    Final     Microbiology: Recent Results (from the past 240 hour(s))  SARS Coronavirus 2 (CEPHEID - Performed in [facility] hospital lab), Hosp Order     Status: None   Collection Time: 02/06/19  1:54 PM  Result Value Ref Range Status   SARS Coronavirus 2 NEGATIVE NEGATIVE Final    Comment: (NOTE) If result is NEGATIVE SARS-CoV-2 target nucleic acids are NOT DETECTED. The SARS-CoV-2 RNA is generally detectable in upper and lower  respiratory specimens during the acute phase of infection. The lowest  concentration of SARS-CoV-2 viral copies this assay can detect is 250  copies / mL. A negative result does not preclude SARS-CoV-2 infection  and should not be used as the sole basis for treatment or other  patient management decisions.  A negative result may occur with  improper specimen collection / handling, submission of specimen other  than  nasopharyngeal swab, presence of viral mutation(s) within the  areas targeted by this assay, and inadequate number of viral copies  (<250 copies / mL). A negative result must be combined with clinical  observations, patient history, and epidemiological information. If result is POSITIVE SARS-CoV-2 target nucleic acids are DETECTED. The SARS-CoV-2 RNA is generally detectable in upper and lower  respiratory specimens dur ing the acute phase of infection.  Positive  results are indicative of active infection with SARS-CoV-2.  Clinical  correlation with patient history and other diagnostic information is  necessary to determine patient infection status.  Positive results do  not rule out bacterial infection or co-infection with other viruses. If result is PRESUMPTIVE POSTIVE SARS-CoV-2 nucleic acids MAY BE PRESENT.   A presumptive positive result was obtained on the submitted specimen  and confirmed on repeat testing.  While 2019 novel coronavirus  (SARS-CoV-2) nucleic acids may be present in the submitted sample  additional confirmatory testing may be necessary for epidemiological  and / or clinical management purposes  to differentiate between  SARS-CoV-2 and other Sarbecovirus currently known to infect humans.  If clinically indicated additional testing  with an alternate test  methodology [phone]) is advised. The SARS-CoV-2 RNA is generally  detectable in upper and lower respiratory sp ecimens during the acute  phase of infection. The expected result is Negative. Fact Sheet for Patients:  StrictlyIdeas.no Fact Sheet for Healthcare Providers: BankingDealers.co.za This test is not yet approved or cleared by the [place] FDA and has been authorized for detection and/or diagnosis of SARS-CoV-2 by FDA under an Emergency Use Authorization (EUA).  This EUA will remain in effect (meaning this test can be used) for the duration of  the COVID-19 declaration under Section 564(b)(1) of the Act, 21 U.S.C. section 360bbb-3(b)(1), unless the authorization is terminated or revoked sooner. Performed at [facility], [place] [address] [place], [place] [address]      Labs: Basic Metabolic Panel: Recent Labs  Lab 02/06/19 1350  NA 138  K 4.0  CL 102  CO2 27  GLUCOSE 111*  BUN 21*  CREATININE 0.60  CALCIUM 10.1   Liver Function Tests: Recent Labs  Lab 02/06/19 1350  AST 25  ALT 29  ALKPHOS 61  BILITOT 0.4  PROT 6.8  ALBUMIN 4.2   No results for input(s): LIPASE, AMYLASE in the last 168 hours. No results for input(s): AMMONIA in the last 168 hours. CBC: Recent Labs  Lab 02/06/19 1350  WBC 5.4  NEUTROABS 2.8  HGB 14.3  HCT 43.4  MCV 94.6  PLT 226   Cardiac Enzymes: Recent Labs  Lab 02/07/19 1043  CKTOTAL 27*   BNP: BNP (last 3 results) No results for input(s): BNP in the last 8760 hours.  ProBNP (last 3 results) No results for input(s): PROBNP in the last 8760 hours.  CBG: No results for input(s): GLUCAP in the last 168 hours.     Signed:  Desiree Hane, MD [facility] 02/12/2019, 10:40 AM

## 2019-02-12 NOTE — Telephone Encounter (Signed)
EMR updated with referral notes dated 02/10/19.

## 2019-02-13 LAB — FACTOR 5 LEIDEN

## 2019-02-14 ENCOUNTER — Ambulatory Visit (INDEPENDENT_AMBULATORY_CARE_PROVIDER_SITE_OTHER): Payer: 59

## 2019-02-14 DIAGNOSIS — I639 Cerebral infarction, unspecified: Secondary | ICD-10-CM

## 2019-02-14 DIAGNOSIS — I4891 Unspecified atrial fibrillation: Secondary | ICD-10-CM

## 2019-02-14 DIAGNOSIS — I6389 Other cerebral infarction: Secondary | ICD-10-CM

## 2019-02-14 LAB — PROTHROMBIN GENE MUTATION

## 2019-02-18 ENCOUNTER — Encounter: Payer: Self-pay | Admitting: *Deleted

## 2019-02-18 NOTE — Telephone Encounter (Signed)
Letter composed, on Dr [employer] desk for review and signature.

## 2019-02-19 ENCOUNTER — Other Ambulatory Visit: Payer: Self-pay | Admitting: *Deleted

## 2019-02-19 NOTE — Patient Outreach (Signed)
[facility]) Care Management  02/19/2019  Kristine Chahal [birth year] [id number]   Returned call to peggie hornak left a message with the main [place] [employer] office in late afternoon on 02/18/19.  She states she wanted to provide an update on her office visit with the neurologist and to inform this RNCM that she has her first outpatient speech therapy appointment on 02/25/19. She states she is still having expressive aphasia, struggling with memory deficits and difficulty with writing.  She says the neurologist wrote a note that she is to remain out of work until at least July 10th. She also says she is wearing the heart monitor.  Plan: Janelis Stelzer this RNCM will call her next week after her speech therapy session to received an update. Paul voiced appreciation.  Barrington Ellison RN,CCM,CDE [facility] Management Coordinator Office Phone [phone] Office Fax [phone]

## 2019-02-20 ENCOUNTER — Ambulatory Visit: Payer: 59

## 2019-02-20 DIAGNOSIS — Z1211 Encounter for screening for malignant neoplasm of colon: Secondary | ICD-10-CM | POA: Diagnosis not present

## 2019-02-25 ENCOUNTER — Telehealth: Payer: Self-pay | Admitting: *Deleted

## 2019-02-25 ENCOUNTER — Ambulatory Visit: Payer: 59 | Attending: Internal Medicine

## 2019-02-25 ENCOUNTER — Other Ambulatory Visit: Payer: Self-pay

## 2019-02-25 DIAGNOSIS — R4701 Aphasia: Secondary | ICD-10-CM | POA: Insufficient documentation

## 2019-02-25 DIAGNOSIS — Z0289 Encounter for other administrative examinations: Secondary | ICD-10-CM

## 2019-02-25 NOTE — Telephone Encounter (Signed)
Received [employer] FMLA. Called patient to discuss. She needs continuous leave from 02/06/19 until 02/21/2019. She has just begun speech therapy and plans to return to work March 24, 2019.  I informed her we are closed on Friday, but I will try to get FMLA done by Thurs. The employers is requesting it by 02/28/19. She verbalized understanding, appreciation.

## 2019-02-25 NOTE — Patient Instructions (Signed)
              [employer] Head's Up

## 2019-02-25 NOTE — Telephone Encounter (Signed)
[employer] FMLA papers on Dr [employer] desk for review, completion, signature.

## 2019-02-26 LAB — FECAL OCCULT BLOOD, IMMUNOCHEMICAL: Fecal Occult Bld: NEGATIVE

## 2019-02-26 NOTE — Therapy (Signed)
[facility] [address] [place], [place], [address] Phone: [phone]   Fax:  [phone]  Speech Language Pathology Evaluation  Patient Details  Name: Judy Walter MRN: [id number] Date of Birth: [birth year] Referring Provider (SLP): Oretha Milch (3W); Glenda Chroman, MD - PCP)   Encounter Date: 02/25/2019  End of Session - 02/26/19 1214    Visit Number  1    Number of Visits  17    Date for SLP Re-Evaluation  05/20/19    SLP Start Time  0904    SLP Stop Time   0945    SLP Time Calculation (min)  41 min    Activity Tolerance  Patient tolerated treatment well       Past Medical History:  Diagnosis Date  . Abnormal Pap smear of cervix 06/2015   ASCUS   . Cervical dysplasia 2017   colposcopic biopsy  . Elevated cholesterol   . 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 Migraines   . Osteopenia 08/2014  . Personal history of venous thrombosis and embolism   . Stroke ([place]) 02/06/2019   acute left basal ganglia  . Vitamin D deficiency     Past Surgical History:  Procedure Laterality Date  . C-sections  2002, 2008  . DILATION AND CURETTAGE OF UTERUS  1997  . wisdom teeth extracted      There were no vitals filed for this visit.  Subjective Assessment - 02/26/19 1154    Subjective  "I have trouble with -my talking." (mildly hesitant)    Patient is accompained by:  Family member   Husband   Currently in Pain?  No/denies         SLP Evaluation [facility] - 02/26/19 1154      SLP Visit Information   SLP Received On  02/25/19    Referring Provider (SLP)  Oretha Milch (3W); Woody Seller, Dhruv B, MD - PCP)    Onset Date  few days prior to 02-06-19    Medical Diagnosis  CVA      Subjective   Subjective  "I'm stammering over my words."      General Information   HPI  CVA two weeks ago, pt reports speaking and writing are issues at present.       Prior Functional Status   Cognitive/Linguistic  Baseline  Within functional limits    Type of Home  House     Lives With  Spouse    Available Support  Family    Education  Bachelor's degree - working as Data processing manager  Full time employment      Cognition   Overall Cognitive Status  Within Functional Limits for tasks assessed      Auditory Comprehension   Overall Auditory Comprehension  Appears within functional limits for tasks assessed    Overall Auditory Comprehension Comments  Pt reports that longer verbal messages can take sligthtly longer for her to comprehend - this is better, though, than just after d/c.      Verbal Expression   Overall Verbal Expression  Impaired    Level of Generative/Spontaneous Verbalization  Conversation    Repetition  Impaired    Level of Impairment  Sentence level   "Pack my box with five dozen jugs of liquid detergent"   Effective Techniques  Other (Comment)   slower rate, extra time   Other Verbal Expression Comments  In conversation, pt with notably mildly hesitant speech.  Written Expression   Written Expression  Exceptions to [facility]    Overall Writen Expression  Pt reports difficulty writing especially numbers (which pt had very minor difficulty with prior to CVA, but now difficulty with words as well.       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                      SLP Education - 02/26/19 1213    Education Details  results, possible goals    Person(s) Educated   Patient;Spouse    Methods  Explanation    Comprehension  Verbalized understanding       SLP Short Term Goals - 02/26/19 1217      SLP SHORT TERM GOAL #1   Title  pt will name average 12 items from a personally relevant category in one minute, in three sessions    Time  4    Period  Weeks    Status  New      SLP [place] #2   Title  pt will use compensatory strategies in 8 minutes simple conversation to allow for WNL communication over two sessions    Time  4    Period  Weeks    Status  New      SLP [place] #3   Title  pt will write information in short lists with error awareness at 100%    Time  4    Period  Weeks    Status  New       SLP Long Term Goals - 02/26/19 1219      SLP LONG TERM GOAL #1   Title  pt will use compensatory strategies in 10 minutes mod complex conversation to allow for WNL communication over two sessions    Time  8    Period  Weeks    Status  New      SLP LONG TERM GOAL #2   Title  pt will use compensatory strategies in 10 minutes mod complex/complex conversation to allow for WNL communication over two sessions    Time  8    Period  Weeks    Status  New      SLP LONG TERM GOAL #3   Title  pt will demo understanding of 10 minutes mod complex/complex conversation with modified independence over three sessions    Time  8    Period  Weeks    Status  New      SLP LONG TERM GOAL #4   Title  pt will demo functional written communication in texts, notes, and other short written info over four sessions    Time  8    Period  Weeks    Status  New       Plan - 02/26/19 1215    Clinical Impression Statement  Pt presents today with mild expressive and receptive aphasia. Verbal expression difficulty noted at sentence and conversation levels, pt also reports difficulty with writing. Recptively, pt reports a  difficulty with longer verbal information. Pt will benefit from skilled ST to improve receptive and expressive langugage skills to  return to work as a Therapist, sports as well as to communicate in the community and at home.    Speech Therapy Frequency  2x / week    Duration  --   8 weeks or 17 sessions   Treatment/Interventions  Compensatory techniques;Functional tasks;Multimodal communcation approach;Language facilitation;Cueing hierarchy;SLP instruction and feedback;Patient/family education;Internal/external aids    Potential to Achieve Goals  Good    Consulted and Agree with Plan of Care  Patient       Patient will benefit from skilled therapeutic intervention in order to improve the following deficits and impairments:   1. Aphasia       Problem List Patient Active Problem List   Diagnosis Date Noted  . Infarction of left basal ganglia ([place]) 02/06/2019  . HYPERLIPIDEMIA TYPE I / IV 04/14/2009  . PULMONARY EMBOLISM, HX OF 04/03/2009    [employer] ,MS, CCC-SLP  02/26/2019, 12:22 PM  [facility] [address] [place], [place], [address] Phone: [phone]   Fax:  [phone]  Name: Teala Daffron MRN: [id number] Date of Birth: 1970-09-13

## 2019-02-27 ENCOUNTER — Encounter: Payer: Self-pay | Admitting: Obstetrics and Gynecology

## 2019-02-27 ENCOUNTER — Telehealth: Payer: Self-pay | Admitting: Obstetrics and Gynecology

## 2019-02-27 NOTE — Telephone Encounter (Signed)
Patient sent the following message through [place]. Routing to triage to assist patient with request.  I have a question about FECAL OCCULT BLOOD, IMMUNOCHEMICAL resulted on 02/26/19, 4:36 PM.    Doristine Devoid news! Thanks!    Nevin Bloodgood

## 2019-02-27 NOTE — Telephone Encounter (Signed)
Spoke with patient. She just wanted to tell Dr.Silva "thanks" for letting her know of her neg IFOB results! Advised patient I would pass her message along. Routed to provider.

## 2019-02-27 NOTE — Telephone Encounter (Signed)
[employer] FMLA papers competed, signed and successfully faxed to [employer]. Patient was notified via my chart.

## 2019-02-27 NOTE — Telephone Encounter (Signed)
Encounter reviewed and closed.  

## 2019-02-28 ENCOUNTER — Ambulatory Visit: Payer: 59

## 2019-02-28 ENCOUNTER — Other Ambulatory Visit: Payer: Self-pay

## 2019-02-28 DIAGNOSIS — R4701 Aphasia: Secondary | ICD-10-CM | POA: Diagnosis not present

## 2019-02-28 NOTE — Therapy (Signed)
[facility] [address] [place], [place], [address] Phone: [phone]   Fax:  [phone]  Speech Language Pathology Treatment  Patient Details  Name: Judy Walter MRN: [id number] Date of Birth: [birth year] Referring Provider (SLP): Oretha Milch (3W); Glenda Chroman, MD - PCP)   Encounter Date: 02/28/2019  End of Session - 02/28/19 1210    Visit Number  2    Number of Visits  17    Date for SLP Re-Evaluation  05/20/19    SLP Start Time  48    SLP Stop Time   1046    SLP Time Calculation (min)  41 min    Activity Tolerance  Patient tolerated treatment well       Past Medical History:  Diagnosis Date  . Abnormal Pap smear of cervix 06/2015   ASCUS   . Cervical dysplasia 2017   colposcopic biopsy  . Elevated cholesterol   . 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 Migraines   . Osteopenia 08/2014  . Personal history of venous thrombosis and embolism   . Stroke ([place]) 02/06/2019   acute left basal ganglia  . Vitamin D deficiency     Past Surgical History:  Procedure Laterality Date  . C-sections  2002, 2008  . DILATION AND CURETTAGE OF UTERUS  1997  . wisdom teeth extracted      There were no vitals filed for this visit.  Subjective Assessment - 02/28/19 1009    Subjective  "I bought some games." (scattergories and taboo)    Currently in Pain?  No/denies            ADULT SLP TREATMENT - 02/28/19 1009      General Information   Behavior/Cognition  Alert;Cooperative;Pleasant mood      Treatment Provided   Treatment provided  Cognitive-Linquistic      Cognitive-Linquistic Treatment   Treatment focused on  Aphasia    Skilled Treatment  SLP assessed pt's writing and reading today due to concerns pt had about these areas. Results in"plan" below. Goals added for written language. Pt described exterior and interior of her home in mod detail with two instances of anomia compensated  with by synonym and by circumlocution. Pt encouraged to journal during this time via computer (as she will use this more throughout her day than a pen/paper), but she oculd write out and transcribe to copmuter if she wished. SLP asked pt if she would like to complete reading assessment from WAB-R and she didn't think so at this time. SLP stated it could awlays be done at later date.       Assessment / Recommendations / Plan   Plan  Writing goals derived after eval are appropriate; continue current plan     Progression Toward Goals   Progression toward goals  Progressing toward goals       SLP Education - 02/28/19 1210    Education Details  journaling via computer and/or pen and paper    Person(s) Educated  Patient    Methods  Explanation    Comprehension  Verbalized understanding       SLP Short Term Goals - 02/28/19 1351      SLP SHORT TERM GOAL #1   Title  pt will name average 12 items from a personally relevant category in one minute, in three sessions    Time  4    Period  Weeks    Status  On-going  SLP SHORT TERM GOAL #2   Title  pt will use compensatory strategies in 8 minutes simple conversation to allow for WNL communication over two sessions    Time  4    Period  Weeks    Status  On-going      SLP SHORT TERM GOAL #3   Title  pt will write information in short lists with error awareness at 100%    Time  4    Period  Weeks    Status  On-going       SLP Long Term Goals - 02/28/19 1352      SLP LONG TERM GOAL #1   Title  pt will use compensatory strategies in 10 minutes mod complex conversation to allow for WNL communication over two sessions    Time  8    Period  Weeks    Status  On-going      SLP LONG TERM GOAL #2   Title  pt will use compensatory strategies in 10 minutes mod complex/complex conversation to allow for WNL communication over two sessions    Time  8    Period  Weeks    Status  On-going      SLP LONG TERM GOAL #3   Title  pt will demo  understanding of 10 minutes mod complex/complex conversation with modified independence over three sessions    Time  8    Period  Weeks    Status  On-going      SLP LONG TERM GOAL #4   Title  pt will demo functional written communication in texts, notes, and other short written info over four sessions    Time  8    Period  Weeks    Status  On-going       Plan - 02/28/19 1349    Clinical Impression Statement  Pt with mild expressive and receptive aphasia. Writing was assessed today and will be used qualitatively to measure progress, as reading was not assessed (using WAB-R). Writing upon Request=6, Writing output= 28/34 (one point for each correct written word), Writing to dictation= 8.5/10. Pt described her home fairly well, two anomic episodes in which pt used ocpmensations. 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    Speech Therapy Frequency  2x / week    Duration  --   8 weeks or 17 sessions   Treatment/Interventions  Compensatory techniques;Functional tasks;Multimodal communcation approach;Language facilitation;Cueing hierarchy;SLP instruction and feedback;Patient/family education;Internal/external aids    Potential to Achieve Goals  Good    Consulted and Agree with Plan of Care  Patient       Patient will benefit from skilled therapeutic intervention in order to improve the following deficits and impairments:   1. Aphasia       Problem List Patient Active Problem List   Diagnosis Date Noted  . Infarction of left basal ganglia ([place]) 02/06/2019  . HYPERLIPIDEMIA TYPE I / IV 04/14/2009  . PULMONARY EMBOLISM, HX OF 04/03/2009    [facility] ,MS, CCC-SLP  02/28/2019, 1:52 PM  [facility] [address] [place], [place], [address] Phone: [phone]   Fax:  [phone]   Name: Kashena Novitski MRN: [id number] Date of Birth: 08-May-1971

## 2019-03-05 ENCOUNTER — Other Ambulatory Visit: Payer: Self-pay | Admitting: Physician Assistant

## 2019-03-05 ENCOUNTER — Ambulatory Visit: Payer: 59

## 2019-03-05 ENCOUNTER — Other Ambulatory Visit: Payer: Self-pay

## 2019-03-05 DIAGNOSIS — R4701 Aphasia: Secondary | ICD-10-CM

## 2019-03-05 DIAGNOSIS — I4891 Unspecified atrial fibrillation: Secondary | ICD-10-CM

## 2019-03-05 DIAGNOSIS — I639 Cerebral infarction, unspecified: Secondary | ICD-10-CM

## 2019-03-05 DIAGNOSIS — I6389 Other cerebral infarction: Secondary | ICD-10-CM

## 2019-03-05 NOTE — Therapy (Signed)
[facility] [address] [place], [place], [address] Phone: [phone]   Fax:  [phone]  Speech Language Pathology Treatment  Patient Details  Name: Judy Walter MRN: [id number] Date of Birth: 1971-01-06 Referring Provider (SLP): Oretha Milch (3W); Glenda Chroman, MD - PCP)   Encounter Date: 03/05/2019  End of Session - 03/05/19 1213    Visit Number  3    Number of Visits  17    Date for SLP Re-Evaluation  05/20/19    SLP Start Time  1003    SLP Stop Time   1046    SLP Time Calculation (min)  43 min    Activity Tolerance  Patient tolerated treatment well       Past Medical History:  Diagnosis Date  . Abnormal Pap smear of cervix 06/2015   ASCUS   . Cervical dysplasia 2017   colposcopic biopsy  . Elevated cholesterol   . Leukemia [facility])    age 48, s/p chemotherapy  . Migraines   . Osteopenia 08/2014  . Personal history of venous thrombosis and embolism   . Stroke ([place]) 02/06/2019   acute left basal ganglia  . Vitamin D deficiency     Past Surgical History:  Procedure Laterality Date  . C-sections  2002, 2008  . DILATION AND CURETTAGE OF UTERUS  1997  . wisdom teeth extracted      There were no vitals filed for this visit.  Subjective Assessment - 03/05/19 1006    Subjective  "Sometimes I just hit a wall (with my speech)."    Currently in Pain?  No/denies            ADULT SLP TREATMENT - 03/05/19 1007      General Information   Behavior/Cognition  Alert;Cooperative;Pleasant mood      Treatment Provided   Treatment provided  Cognitive-Linquistic      Cognitive-Linquistic Treatment   Treatment focused on  Aphasia    Skilled Treatment  Pt has been doing Elevate app while her son is in bed from wisdom teeth extraction. SLP encouraged pt to incr word finding games/apps as she is able. She typed a prayer and it took pt half an hour to create. SLP engaged in simple to mod complex  conversation of 10 minutes and pt had one anomic event compesated with description strategy. Pt to read a more copmlex devotional and write a summary about it, and write out a prayer based on ideas/thoughts from the devotional for homework.       Assessment / Recommendations / Plan   Plan  Continue with current plan of care      Progression Toward Goals   Progression toward goals  Progressing toward goals         SLP Short Term Goals - 03/05/19 1214      SLP SHORT TERM GOAL #1   Title  pt will name average 12 items from a personally relevant category in one minute, in three sessions    Time  3    Period  Weeks    Status  On-going      SLP SHORT TERM GOAL #2   Title  pt will use compensatory strategies in 8 minutes simple conversation to allow for WNL communication over two sessions    Baseline  03-05-19    Time  3    Period  Weeks    Status  On-going      SLP SHORT TERM GOAL #3  Title  pt will write information in short lists with error awareness at 100%    Time  3    Period  Weeks    Status  On-going       SLP Long Term Goals - 03/05/19 1214      SLP LONG TERM GOAL #1   Title  pt will use compensatory strategies in 10 minutes mod complex conversation to allow for WNL communication over 2 sessions    Time  7    Period  Weeks    Status  On-going      SLP LONG TERM GOAL #2   Title  pt will use compensatory strategies in 10 minutes mod complex/complex conversation to allow for WNL communication over three sessions    Time  7    Period  Weeks    Status  Revised   from "two sessions" to "3 sessions"     SLP LONG TERM GOAL #3   Title  pt will demo understanding of 10 minutes mod complex/complex conversation with modified independence over three sessions    Time  7    Period  Weeks    Status  On-going      SLP LONG TERM GOAL #4   Title  pt will demo functional written communication in texts, notes, and other short written info over four sessions    Time  7     Period  Weeks    Status  On-going       Plan - 03/05/19 1213    Clinical Impression Statement  Pt with mild expressive and receptive aphasia. See "skilled intervention" for details. 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    Speech Therapy Frequency  2x / week    Duration  --   8 weeks or 17 sessions   Treatment/Interventions  Compensatory techniques;Functional tasks;Multimodal communcation approach;Language facilitation;Cueing hierarchy;SLP instruction and feedback;Patient/family education;Internal/external aids    Potential to Achieve Goals  Good    Consulted and Agree with Plan of Care  Patient       Patient will benefit from skilled therapeutic intervention in order to improve the following deficits and impairments:   1. Aphasia       Problem List Patient Active Problem List   Diagnosis Date Noted  . Infarction of left basal ganglia ([place]) 02/06/2019  . HYPERLIPIDEMIA TYPE I / IV 04/14/2009  . PULMONARY EMBOLISM, HX OF 04/03/2009    [facility] ,MS, CCC-SLP  03/05/2019, 12:16 PM  [facility] [address] [place], [place], [address] Phone: [phone]   Fax:  [phone]   Name: Kristia Jupiter MRN: [id number] Date of Birth: July 21, 1971

## 2019-03-07 ENCOUNTER — Other Ambulatory Visit: Payer: Self-pay

## 2019-03-07 ENCOUNTER — Ambulatory Visit: Payer: 59 | Admitting: Speech Pathology

## 2019-03-07 DIAGNOSIS — R4701 Aphasia: Secondary | ICD-10-CM

## 2019-03-07 NOTE — Patient Instructions (Signed)
   Practice "role-playing" some scenarios where you have to communicate information rapidly. You can try this with your friend Crystal. Have her pretend to be Rapid response team, ICU nurse to whom you are giving report, the IV team, the physician, etc and practice giving report or communicating urgent information about a hypothetical patient you are treating.   It's OK to take notes and use them on your call initially if this helps you feel more comfortable.

## 2019-03-07 NOTE — Therapy (Signed)
[facility] [address] [place], [place], [address] Phone: [phone]   Fax:  [phone]  Speech Language Pathology Treatment  Patient Details  Name: Judy Walter MRN: [id number] Date of Birth: [birth year] Referring Provider (SLP): Oretha Milch (3W); Glenda Chroman, MD - PCP)   Encounter Date: 03/07/2019  End of Session - 03/07/19 1055    Visit Number  4    Number of Visits  17    Date for SLP Re-Evaluation  05/20/19    SLP Start Time  1000    SLP Stop Time   5465    SLP Time Calculation (min)  44 min    Activity Tolerance  Patient tolerated treatment well       Past Medical History:  Diagnosis Date  . Abnormal Pap smear of cervix 06/2015   ASCUS   . Cervical dysplasia 2017   colposcopic biopsy  . Elevated cholesterol   . Leukemia [facility])    age 48, s/p chemotherapy  . Migraines   . Osteopenia 08/2014  . Personal history of venous thrombosis and embolism   . Stroke ([place]) 02/06/2019   acute left basal ganglia  . Vitamin D deficiency     Past Surgical History:  Procedure Laterality Date  . C-sections  2002, 2008  . DILATION AND CURETTAGE OF UTERUS  1997  . wisdom teeth extracted      There were no vitals filed for this visit.  Subjective Assessment - 03/07/19 1000    Subjective  "Nice to finally meet you."    Currently in Pain?  No/denies            ADULT SLP TREATMENT - 03/07/19 1001      General Information   Behavior/Cognition  Alert;Cooperative;Pleasant mood      Treatment Provided   Treatment provided  Cognitive-Linquistic      Cognitive-Linquistic Treatment   Treatment focused on  Aphasia    Skilled Treatment  Pt brought devotionals from home; reported she had more difficulty working on it at the end of the day when she was tired. In simple to mod complex conversation (15 minutes), pt had 2 instances of dysnomia, which she self-corrected, as well as 2 brief hesitations. SLP  worked with pt to generate scenarios at work in which she might be required to communicate information quickly, such as calling a rapid response, giving report, etc. Pt with intermittent hesitation when role playing a call to rapid response. SLP encouraged pt to practice these scenarios with her best friend, who is also an Therapist, sports.       Assessment / Recommendations / Plan   Plan  Continue with current plan of care      Progression Toward Goals   Progression toward goals  Progressing toward goals       SLP Education - 03/07/19 1054    Education Details  role playing work-related scenarios with friends in healthcare    Person(s) Educated  Patient    Methods  Explanation;Handout    Comprehension  Verbalized understanding       SLP Short Term Goals - 03/07/19 1056      SLP SHORT TERM GOAL #1   Title  pt will name average 12 items from a personally relevant category in one minute, in three sessions    Time  3    Period  Weeks    Status  On-going      SLP SHORT TERM GOAL #2   Title  pt will use compensatory strategies in 8 minutes simple conversation to allow for WNL communication over two sessions    Baseline  03-05-19    Time  3    Period  Weeks    Status  On-going      SLP SHORT TERM GOAL #3   Title  pt will write information in short lists with error awareness at 100%    Time  3    Period  Weeks    Status  On-going       SLP Long Term Goals - 03/07/19 1056      SLP LONG TERM GOAL #1   Title  pt will use compensatory strategies in 10 minutes mod complex conversation to allow for WNL communication over 2 sessions    Time  7    Period  Weeks    Status  On-going      SLP LONG TERM GOAL #2   Title  pt will use compensatory strategies in 10 minutes mod complex/complex conversation to allow for WNL communication over three sessions    Time  7    Period  Weeks    Status  Revised   from "two sessions" to "3 sessions"     SLP LONG TERM GOAL #3   Title  pt will demo  understanding of 10 minutes mod complex/complex conversation with modified independence over three sessions    Time  7    Period  Weeks    Status  On-going      SLP LONG TERM GOAL #4   Title  pt will demo functional written communication in texts, notes, and other short written info over four sessions    Time  7    Period  Weeks    Status  On-going       Plan - 03/07/19 1056    Clinical Impression Statement  Pt with mild expressive and receptive aphasia. See "skilled intervention" for details. 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    Speech Therapy Frequency  2x / week    Duration  --   8 weeks or 17 sessions   Treatment/Interventions  Compensatory techniques;Functional tasks;Multimodal communcation approach;Language facilitation;Cueing hierarchy;SLP instruction and feedback;Patient/family education;Internal/external aids    Potential to Achieve Goals  Good    Consulted and Agree with Plan of Care  Patient       Patient will benefit from skilled therapeutic intervention in order to improve the following deficits and impairments:   1. Aphasia      Deneise Lever, MS, [place] Speech-Language Pathologist   Problem List Patient Active Problem List   Diagnosis Date Noted  . Infarction of left basal ganglia ([place]) 02/06/2019  . HYPERLIPIDEMIA TYPE I / IV 04/14/2009  . PULMONARY EMBOLISM, HX OF 04/03/2009    Aliene Altes 03/07/2019, 10:56 AM  [facility] [address] [place], [place], [address] Phone: [phone]   Fax:  [phone]   Name: Judy Walter MRN: [id number] Date of Birth: 49/26/1972

## 2019-03-10 ENCOUNTER — Telehealth: Payer: Self-pay | Admitting: Internal Medicine

## 2019-03-10 ENCOUNTER — Other Ambulatory Visit: Payer: Self-pay | Admitting: *Deleted

## 2019-03-10 ENCOUNTER — Ambulatory Visit: Payer: 59 | Admitting: Speech Pathology

## 2019-03-10 ENCOUNTER — Other Ambulatory Visit: Payer: Self-pay

## 2019-03-10 DIAGNOSIS — R4701 Aphasia: Secondary | ICD-10-CM | POA: Diagnosis not present

## 2019-03-10 NOTE — Patient Instructions (Signed)
See if you can find some nursing case studies online for free. Practice taking notes on the patient and then giving "SBAR" (Situation, Background, Assessment, Response). Bring with you so you can practice giving report.

## 2019-03-10 NOTE — Telephone Encounter (Signed)

## 2019-03-10 NOTE — Patient Outreach (Signed)
[facility]) Care Management  03/10/2019  Judy Walter 1970-10-10 [id number]  Follow up phone call  Courtesy call to [facility]. Left HIPAA compliant voice message on [name]'s mobile number requesting return call to assess her ongoing recovery from the left basal ganglia stroke she had on 02/06/19.  Barrington Ellison RN,CCM,CDE [facility] Management Coordinator Office Phone [phone] Office Fax [phone]

## 2019-03-10 NOTE — Telephone Encounter (Signed)

## 2019-03-10 NOTE — Therapy (Signed)
[facility] [address] [place], [place], [address] Phone: [phone]   Fax:  [phone]  Speech Language Pathology Treatment  Patient Details  Name: Judy Walter MRN: [id number] Date of Birth: [birth year] Referring Provider (SLP): Oretha Milch (3W); Glenda Chroman, MD - PCP)   Encounter Date: 03/10/2019  End of Session - 03/10/19 1357    Visit Number  5    Number of Visits  17    Date for SLP Re-Evaluation  05/20/19    SLP Start Time  1300    SLP Stop Time   9833    SLP Time Calculation (min)  48 min    Activity Tolerance  Patient tolerated treatment well       Past Medical History:  Diagnosis Date  . Abnormal Pap smear of cervix 06/2015   ASCUS   . Cervical dysplasia 2017   colposcopic biopsy  . Elevated cholesterol   . Leukemia [facility])    age 11, s/p chemotherapy  . Migraines   . Osteopenia 08/2014  . Personal history of venous thrombosis and embolism   . Stroke ([place]) 02/06/2019   acute left basal ganglia  . Vitamin D deficiency     Past Surgical History:  Procedure Laterality Date  . C-sections  2002, 2008  . DILATION AND CURETTAGE OF UTERUS  1997  . wisdom teeth extracted      There were no vitals filed for this visit.  Subjective Assessment - 03/10/19 1357    Subjective  "She was, uh, Crystal was busy."    Currently in Pain?  No/denies            ADULT SLP TREATMENT - 03/10/19 1357      General Information   Behavior/Cognition  Alert;Cooperative;Pleasant mood      Treatment Provided   Treatment provided  Cognitive-Linquistic      Cognitive-Linquistic Treatment   Treatment focused on  Aphasia    Skilled Treatment  Pt reports her RN friend was too busy to practice role-playing pt handoff scenarios, however pt practiced this on her own. SLP provided pt with pre and post-op surgical case studies, pausing periodically for pt to discuss appropriate actions and assessments. Pt  required extended time, frequent pausing and hesitation. Pt agreed after case study completed that information was "scattered." Pt to find additional case studies at home and practice note-taking and SBAR (see pt instructions) in preparation to give pt handoff report to SLP next session.       Assessment / Recommendations / Plan   Plan  Continue with current plan of care      Progression Toward Goals   Progression toward goals  Progressing toward goals         SLP Short Term Goals - 03/10/19 1358      SLP SHORT TERM GOAL #1   Title  pt will name average 12 items from a personally relevant category in one minute, in three sessions    Time  2    Period  Weeks    Status  On-going      SLP SHORT TERM GOAL #2   Title  pt will use compensatory strategies in 8 minutes simple conversation to allow for WNL communication over two sessions    Baseline  03-05-19    Time  2    Period  Weeks    Status  On-going      SLP SHORT TERM GOAL #3   Title  pt  will write information in short lists with error awareness at 100%    Time  2    Period  Weeks    Status  On-going       SLP Long Term Goals - 03/10/19 1358      SLP LONG TERM GOAL #1   Title  pt will use compensatory strategies in 10 minutes mod complex conversation to allow for WNL communication over 2 sessions    Time  6    Period  Weeks    Status  On-going      SLP LONG TERM GOAL #2   Title  pt will use compensatory strategies in 10 minutes mod complex/complex conversation to allow for WNL communication over three sessions    Time  6    Period  Weeks    Status  Revised   from "two sessions" to "3 sessions"     SLP LONG TERM GOAL #3   Title  pt will demo understanding of 10 minutes mod complex/complex conversation with modified independence over three sessions    Time  6    Period  Weeks    Status  On-going      SLP LONG TERM GOAL #4   Title  pt will demo functional written communication in texts, notes, and other short  written info over four sessions    Time  6    Period  Weeks    Status  On-going       Plan - 03/10/19 1358    Clinical Impression Statement  Pt with mild expressive and receptive aphasia. See "skilled intervention" for details. 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    Speech Therapy Frequency  2x / week    Duration  --   8 weeks or 17 sessions   Treatment/Interventions  Compensatory techniques;Functional tasks;Multimodal communcation approach;Language facilitation;Cueing hierarchy;SLP instruction and feedback;Patient/family education;Internal/external aids    Potential to Achieve Goals  Good    Consulted and Agree with Plan of Care  Patient       Patient will benefit from skilled therapeutic intervention in order to improve the following deficits and impairments:   1. Aphasia       Problem List Patient Active Problem List   Diagnosis Date Noted  . Infarction of left basal ganglia ([place]) 02/06/2019  . HYPERLIPIDEMIA TYPE I / IV 04/14/2009  . PULMONARY EMBOLISM, HX OF 04/03/2009   Deneise Lever, [place], [place] 03/10/2019, 1:59 PM  [facility] [address] [place], [place], [address] Phone: [phone]   Fax:  [phone]   Name: Judy Walter MRN: [id number] Date of Birth: Jul 17, 1971

## 2019-03-10 NOTE — Telephone Encounter (Signed)
Follow up     Pt says due to her health history, she will need her husband to assist her to her appt. Husband stated NO to all COVID screening questions

## 2019-03-11 ENCOUNTER — Telehealth: Payer: Self-pay | Admitting: *Deleted

## 2019-03-11 ENCOUNTER — Ambulatory Visit (INDEPENDENT_AMBULATORY_CARE_PROVIDER_SITE_OTHER): Payer: 59 | Admitting: Internal Medicine

## 2019-03-11 ENCOUNTER — Other Ambulatory Visit: Payer: Self-pay | Admitting: *Deleted

## 2019-03-11 DIAGNOSIS — Z0289 Encounter for other administrative examinations: Secondary | ICD-10-CM

## 2019-03-11 DIAGNOSIS — R002 Palpitations: Secondary | ICD-10-CM

## 2019-03-11 NOTE — Patient Outreach (Signed)
[facility]) Care Management  03/11/2019  Charlcie Prisco [birth year] [id number]   Nevin Bloodgood returned cal to this RNCM at 5:23 pm on 6/29 and left message stating she is doing well, still having expressive aphasia but says her outpatient speech therapy sessions are going well. She also stated she appreciated the call from this RNCM.  Barrington Ellison RN,CCM,CDE [place] Management Coordinator Office Phone [phone] Office Fax [phone]

## 2019-03-11 NOTE — Telephone Encounter (Signed)
Received [employer] return to work form and [employer] critical illness claim form. Forms on Dr [employer] desk for review, completion, signatures.

## 2019-03-11 NOTE — Telephone Encounter (Signed)
[employer] and [employer] forms completed, signed and sent to Medical records for processing.

## 2019-03-11 NOTE — Patient Instructions (Addendum)
Medication Instructions:  Your physician recommends that you continue on your current medications as directed. Please refer to the Current Medication list given to you today.  Labwork: None ordered.  Testing/Procedures: None ordered.  Follow-Up: March 18, 2019 at 7:10 am for loop placement  Any Other Special Instructions Will Be Listed Below (If Applicable).  If you need a refill on your cardiac medications before your next appointment, please call your pharmacy.

## 2019-03-11 NOTE — Progress Notes (Signed)
HPI Mrs. Patchen returns today for followup. I saw her in the hospital when she presented with a stroke. She has had a full recovery. She wore a cardiac monitor which did not show any arrhythmias. She feels well. She has not had any additional neurologic events.  Allergies  Allergen Reactions  . Other     Darvocet- hallucinations  . Sulfonamide Derivatives     REACTION: fever     Current Outpatient Medications  Medication Sig Dispense Refill  . aspirin EC 81 MG EC tablet Take 1 tablet (81 mg total) by mouth daily. 90 tablet 0  . atorvastatin (LIPITOR) 80 MG tablet Take 1 tablet (80 mg total) by mouth daily at 6 PM. 90 tablet 0  . Cholecalciferol (VITAMIN D3) 5000 UNITS TABS Take 1 tablet by mouth daily.    . clopidogrel (PLAVIX) 75 MG tablet Take 1 tablet (75 mg total) by mouth daily. 21 tablet 0  . ibuprofen (ADVIL,MOTRIN) 200 MG tablet Take 400 mg by mouth 2 (two) times daily as needed for headache or moderate pain.     Marland Kitchen levonorgestrel (MIRENA) 20 MCG/24HR IUD 1 each by Intrauterine route once.    Marland Kitchen MAGNESIUM PO Take by mouth daily.    Marland Kitchen POTASSIUM PO Take by mouth daily. Powder    . venlafaxine XR (EFFEXOR XR) 37.5 MG 24 hr capsule Take 1 capsule (37.5 mg total) by mouth daily. 30 capsule 1   No current facility-administered medications for this visit.      Past Medical History:  Diagnosis Date  . Abnormal Pap smear of cervix 06/2015   ASCUS   . Cervical dysplasia 2017   colposcopic biopsy  . Elevated cholesterol   . 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 Migraines   . Osteopenia 08/2014  . Personal history of venous thrombosis and embolism   . Stroke ([place]) 02/06/2019   acute left basal ganglia  . Vitamin D deficiency     ROS:   All systems reviewed and negative except as noted in the HPI.   Past Surgical History:  Procedure Laterality Date  . C-sections  2002, 2008  . DILATION AND CURETTAGE OF UTERUS  1997  . wisdom teeth extracted        Family History  Problem Relation Age of Onset  . Coronary artery disease Father   . Hypertension Father   . Hemochromatosis Father   . Cancer Father        prostate  . Coronary artery disease Paternal Grandmother   . Diabetes Paternal Grandmother   . Hypertension Paternal Grandmother   . Breast cancer Maternal Grandmother   . Osteoporosis Maternal Grandmother   . Alzheimer's disease Maternal Grandmother      Social History   Socioeconomic History  . Marital status: Married    Spouse name: Not on file  . Number of children: Not on file  . Years of education: Not on file  . Highest education level: Not on file  Occupational History  . Occupation: Programmer, multimedia: [employer]: [employer]  Social Needs  . Financial resource strain: Not on file  . Food insecurity    Worry: Not on file    Inability: Not on file  . Transportation needs    Medical: Not on file    Non-medical: Not on file  Tobacco Use  . Smoking status: Never Smoker  .  Smokeless tobacco: Never Used  Substance and Sexual Activity  . Alcohol use: No    Alcohol/week: 0.0 standard drinks    Comment: rare wine  . Drug use: No  . Sexual activity: Yes    Partners: Male    Birth control/protection: I.U.D.    Comment: Mirena inserted 11-10-13   Lifestyle  . Physical activity    Days per week: Not on file    Minutes per session: Not on file  . Stress: Not on file  Relationships  . Social Herbalist on phone: Not on file    Gets together: Not on file    Attends religious service: Not on file    Active member of club or organization: Not on file    Attends meetings of clubs or organizations: Not on file    Relationship status: Not on file  . Intimate partner violence    Fear of current or ex partner: Not on file    Emotionally abused: Not on file    Physically abused: Not on file    Forced sexual activity: Not on file  Other Topics Concern  . Not on  file  Social History Narrative   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      BP 128/76   Pulse (!) 57   Ht 5' 4.5" (1.638 m)   Wt 148 lb 3.2 oz (67.2 kg)   SpO2 98%   BMI 25.05 kg/m   Physical Exam:  Well appearing 48 yo woman, NAD HEENT: Unremarkable Neck:  6 cm JVD, no thyromegally Lymphatics:  No adenopathy Back:  No CVA tenderness Lungs:  Clear with no wheezes HEART:  Regular rate rhythm, no murmurs, no rubs, no clicks Abd:  soft, positive bowel sounds, no organomegally, no rebound, no guarding Ext:  2 plus pulses, no edema, no cyanosis, no clubbing Skin:  No rashes no nodules Neuro:  CN II through XII intact, motor grossly intact   Assess/Plan: 1. Cryptogenic stroke - the etiology of her symptoms is still uncertain. I have discussed the indications for ILR insertion. She would like to proceed. 2. Pulmonary embolism - this occurred over 2 years ago. No recurrence. 3. Palpitations - she did not have any arrhythmias on her monitor.  Mikle Bosworth.D.

## 2019-03-13 ENCOUNTER — Telehealth: Payer: Self-pay | Admitting: Obstetrics and Gynecology

## 2019-03-13 ENCOUNTER — Other Ambulatory Visit: Payer: Self-pay

## 2019-03-13 ENCOUNTER — Other Ambulatory Visit: Payer: Self-pay | Admitting: Obstetrics and Gynecology

## 2019-03-13 ENCOUNTER — Ambulatory Visit: Payer: 59 | Attending: Internal Medicine

## 2019-03-13 DIAGNOSIS — R4701 Aphasia: Secondary | ICD-10-CM | POA: Insufficient documentation

## 2019-03-13 DIAGNOSIS — Z1231 Encounter for screening mammogram for malignant neoplasm of breast: Secondary | ICD-10-CM

## 2019-03-13 NOTE — Telephone Encounter (Signed)
Spoke with patient. Requesting to reschedule 04/30/19 colpo to earlier date. 11/27/18 pap ASCUS, neg hpv, hx of LGSIL. IUD for contraceptive, no menses with IUD. Patient states she is returning to work 7/16, request to schedule procedure prior to returning.   Colpo and pap rescheduled for 7/15 at 3pm with Dr. Quincy Simmonds.   Routing to provider for final review. Patient is agreeable to disposition. Will close encounter.

## 2019-03-13 NOTE — Therapy (Signed)
[facility] [address] [place], [place], [address] Phone: [phone]   Fax:  [phone]  Speech Language Pathology Treatment  Patient Details  Name: Judy Walter MRN: [id number] Date of Birth: Aug 14, 1971 Referring Provider (SLP): Oretha Milch (3W); Glenda Chroman, MD - PCP)   Encounter Date: 03/13/2019    Past Medical History:  Diagnosis Date  . Abnormal Pap smear of cervix 06/2015   ASCUS   . Cervical dysplasia 2017   colposcopic biopsy  . Elevated cholesterol   . Leukemia [facility])    age 20, s/p chemotherapy  . Migraines   . Osteopenia 08/2014  . Personal history of venous thrombosis and embolism   . Stroke ([place]) 02/06/2019   acute left basal ganglia  . Vitamin D deficiency     Past Surgical History:  Procedure Laterality Date  . C-sections  2002, 2008  . DILATION AND CURETTAGE OF UTERUS  1997  . wisdom teeth extracted      There were no vitals filed for this visit.  Subjective Assessment - 03/13/19 1109    Subjective  "I found some things online that I can tweak to make (handoff situations)work better for me."    Currently in Pain?  No/denies            ADULT SLP TREATMENT - 03/13/19 1117      General Information   Behavior/Cognition  Alert;Cooperative;Pleasant mood      Treatment Provided   Treatment provided  Cognitive-Linquistic      Cognitive-Linquistic Treatment   Treatment focused on  Aphasia    Skilled Treatment  Pt and SLP discussed pt's return to work and pt thought she could not return <12 hours and she/SLP discussed how pt could return with a graduated schedule. Pt with WFL/WNL verbal expression for this 15 minute discussion. SLP and pt talked about how pt could practice verbally and written intensive or complex situations at work - compensations and how to practice - SLP and pt agree pt can decr frequency to x1/week due to progress. Homework is 1) to have a conversation  with her supervisor about her hours/pt load returning to work and 2) get with her friend from the floor to go over case studies twice.       Assessment / Recommendations / Plan   Plan  Continue with current plan of care      Progression Toward Goals   Progression toward goals  --   Frequency change to once/week due to progress        SLP Short Term Goals - 03/13/19 1347      SLP SHORT TERM GOAL #1   Title  pt will name average 12 items from a personally relevant category in one minute, in three sessions    Time  2    Period  Weeks    Status  On-going      SLP SHORT TERM GOAL #2   Title  pt will use compensatory strategies in 8 minutes simple conversation to allow for WNL communication over two sessions    Status  Achieved      SLP [place] #3   Title  pt will write information in short lists with error awareness at 100%    Time  2    Period  Weeks    Status  On-going       SLP Long Term Goals - 03/13/19 1348      SLP LONG TERM GOAL #  1   Title  pt will use compensatory strategies in 10 minutes mod complex conversation to allow for WNL communication over 2 sessions    Baseline  03-13-19    Time  6    Period  Weeks    Status  On-going      SLP LONG TERM GOAL #2   Title  pt will use compensatory strategies in 10 minutes mod complex/complex conversation to allow for WNL communication over three sessions    Time  6    Period  Weeks    Status  On-going   from "two sessions" to "3 sessions"     SLP LONG TERM GOAL #3   Title  pt will demo understanding of 10 minutes mod complex/complex conversation with modified independence over three sessions    Time  6    Period  Weeks    Status  On-going      SLP LONG TERM GOAL #4   Title  pt will demo functional written communication in texts, notes, and other short written info over four sessions    Time  6    Period  Weeks    Status  On-going       Plan - 03/13/19 1346    Clinical Impression Statement  Pt with mild  expressive  aphasia, receptive aphasia resolving/resolved. See "skilled intervention" for details. 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    Speech Therapy Frequency  1x /week    Duration  --   8 weeks or 17 sessions   Treatment/Interventions  Compensatory techniques;Functional tasks;Multimodal communcation approach;Language facilitation;Cueing hierarchy;SLP instruction and feedback;Patient/family education;Internal/external aids    Potential to Achieve Goals  Good    Consulted and Agree with Plan of Care  Patient       Patient will benefit from skilled therapeutic intervention in order to improve the following deficits and impairments:   1. Aphasia       Problem List Patient Active Problem List   Diagnosis Date Noted  . Palpitations 03/11/2019  . Infarction of left basal ganglia ([place]) 02/06/2019  . HYPERLIPIDEMIA TYPE I / IV 04/14/2009  . PULMONARY EMBOLISM, HX OF 04/03/2009    [facility] ,MS, CCC-SLP  03/13/2019, 1:49 PM  [facility] [address] [place], [place], [address] Phone: [phone]   Fax:  [phone]   Name: Caitlen Worth MRN: [id number] Date of Birth: 1971/06/09

## 2019-03-13 NOTE — Telephone Encounter (Signed)
Patient is calling to reschedule Colpo/pap that is scheduled for Aug 19.

## 2019-03-17 ENCOUNTER — Telehealth: Payer: Self-pay | Admitting: Internal Medicine

## 2019-03-17 DIAGNOSIS — Z6825 Body mass index (BMI) 25.0-25.9, adult: Secondary | ICD-10-CM | POA: Diagnosis not present

## 2019-03-17 DIAGNOSIS — E7800 Pure hypercholesterolemia, unspecified: Secondary | ICD-10-CM | POA: Diagnosis not present

## 2019-03-17 DIAGNOSIS — Z299 Encounter for prophylactic measures, unspecified: Secondary | ICD-10-CM | POA: Diagnosis not present

## 2019-03-17 DIAGNOSIS — R5383 Other fatigue: Secondary | ICD-10-CM | POA: Diagnosis not present

## 2019-03-17 DIAGNOSIS — Z79899 Other long term (current) drug therapy: Secondary | ICD-10-CM | POA: Diagnosis not present

## 2019-03-17 DIAGNOSIS — Z0000 Encounter for general adult medical examination without abnormal findings: Secondary | ICD-10-CM | POA: Diagnosis not present

## 2019-03-17 DIAGNOSIS — Z1331 Encounter for screening for depression: Secondary | ICD-10-CM | POA: Diagnosis not present

## 2019-03-17 NOTE — Telephone Encounter (Signed)

## 2019-03-18 ENCOUNTER — Other Ambulatory Visit: Payer: Self-pay

## 2019-03-18 ENCOUNTER — Ambulatory Visit: Payer: 59 | Admitting: Speech Pathology

## 2019-03-18 ENCOUNTER — Ambulatory Visit: Payer: 59 | Admitting: Internal Medicine

## 2019-03-18 DIAGNOSIS — I6389 Other cerebral infarction: Secondary | ICD-10-CM

## 2019-03-18 DIAGNOSIS — I639 Cerebral infarction, unspecified: Secondary | ICD-10-CM

## 2019-03-18 NOTE — Progress Notes (Signed)
EP Procedure Note  SURGEON:  Cristopher Peru, MD     PREPROCEDURE DIAGNOSIS:  Cryptogenic Stroke    POSTPROCEDURE DIAGNOSIS:  Cryptogenic Stroke     PROCEDURES:   1. Implantable loop recorder implantation    INTRODUCTION:  Judy Walter is a 48 y.o. female with a history of unexplained stroke who presents today for implantable loop implantation.  The patient has had a cryptogenic stroke.  Despite an extensive workup by neurology, no reversible causes have been identified.  The patient has worn telemetry during which there were no arrhythmias.  There is significant concern for possible atrial fibrillation as the cause for the patients stroke.  The patient therefore presents today for implantable loop implantation.     DESCRIPTION OF PROCEDURE:  Informed written consent was obtained.  The patient required no sedation for the procedure today.  Mapping over the patient's chest was performed by the EP lab staff to identify the area where electrograms were most prominent for ILR recording.  This area was found to be the left parasternal region over the 3rd-4th intercostal space. The patients left chest was therefore prepped and draped in the usual sterile fashion by the EP lab staff. The skin overlying the left parasternal region was infiltrated with lidocaine for local analgesia.  A 0.5-cm incision was made over the left parasternal region over the 3rd intercostal space.  A subcutaneous ILR pocket was fashioned using a combination of sharp and blunt dissection.  A Medtronic Reveal [place] model [id number] implantable loop recorder was then placed into the pocket  R waves were very prominent and measured 0.3 mV. EBL<1 ml.  Steri- Strips and a sterile dressing were then applied.  There were no early apparent complications.     CONCLUSIONS:   1. Successful implantation of a Medtronic Reveal LINQ implantable loop recorder for cryptogenic stroke  2. No early apparent complications.   Cristopher Peru,  MD 03/18/2019 8:00 AM

## 2019-03-18 NOTE — Patient Instructions (Addendum)
Medication Instructions:  Your physician recommends that you continue on your current medications as directed. Please refer to the Current Medication list given to you today.  Labwork: None ordered.  Testing/Procedures: None ordered.  Follow-Up:  You will have a virtual follow up visit 7-10 days after your procedure.  Your physician wants you to follow-up in: 6 months with Dr. Lovena Le.    You will receive a reminder letter in the mail two months in advance. If you don't receive a letter, please call our office to schedule the follow-up appointment.    Any Other Special Instructions Will Be Listed Below (If Applicable).  If you need a refill on your cardiac medications before your next appointment, please call your pharmacy.     Implantable Loop Recorder Placement, Care After This sheet gives you information about how to care for yourself after your procedure. Your health care provider may also give you more specific instructions. If you have problems or questions, contact your health care provider. What can I expect after the procedure? After the procedure, it is common to have:  Soreness or discomfort near the incision.  Some swelling or bruising near the incision. Follow these instructions at home: Incision care   Follow instructions from your health care provider about how to take care of your incision. Make sure you: ? Wash your hands with soap and water before you change your bandage (dressing). If soap and water are not available, use hand sanitizer. ? Change your dressing as told by your health care provider. ? Keep your dressing dry. ? Leave stitches (sutures), skin glue, or adhesive strips in place. These skin closures may need to stay in place for 2 weeks or longer. If adhesive strip edges start to loosen and curl up, you may trim the loose edges. Do not remove adhesive strips completely unless your health care provider tells you to do that.  Check your incision area  every day for signs of infection. Check for: ? Redness, swelling, or pain. ? Fluid or blood. ? Warmth. ? Pus or a bad smell.  Do not take baths, swim, or use a hot tub until your health care provider approves. Ask your health care provider if you can take showers. Activity   Return to your normal activities as told by your health care provider. Ask your health care provider what activities are safe for you. General instructions  Follow instructions from your health care provider about how to manage your implantable loop recorder and transmit the information. Learn how to activate a recording if this is necessary for your type of device.  Do not go through a metal detection gate, and do not let someone hold a metal detector over your chest. Show your ID card.  Do not have an MRI unless you check with your health care provider first.  Take over-the-counter and prescription medicines only as told by your health care provider.  Keep all follow-up visits as told by your health care provider. This is important. Contact a health care provider if:  You have redness, swelling, or pain around your incision.  You have a fever.  You have pain that is not relieved by your pain medicine.  You have triggered your device because of fainting (syncope) or because of a heartbeat that feels like it is racing, slow, fluttering, or skipping (palpitations). Get help right away if you have:  Chest pain.  Difficulty breathing. Summary  After the procedure, it is common to have soreness or discomfort  near the incision.  Change your dressing as told by your health care provider.  Follow instructions from your health care provider about how to manage your implantable loop recorder and transmit the information.  Keep all follow-up visits as told by your health care provider. This is important. This information is not intended to replace advice given to you by your health care provider. Make sure you  discuss any questions you have with your health care provider. Document Released: 08/09/2015 Document Revised: 10/13/2017 Document Reviewed: 10/13/2017 Elsevier Patient Education  2020 [employer].

## 2019-03-19 ENCOUNTER — Telehealth: Payer: Self-pay | Admitting: Obstetrics and Gynecology

## 2019-03-19 NOTE — Telephone Encounter (Signed)
Spoke with patient and conveyed benefits for colposcopy. Patient understands/agreeable with the benefits. Patient is aware of the cancellation policy.

## 2019-03-20 ENCOUNTER — Ambulatory Visit: Payer: 59

## 2019-03-20 ENCOUNTER — Other Ambulatory Visit: Payer: Self-pay

## 2019-03-20 DIAGNOSIS — R4701 Aphasia: Secondary | ICD-10-CM

## 2019-03-20 NOTE — Therapy (Signed)
[facility] [address] [place], [place], [address] Phone: [phone]   Fax:  [phone]  Speech Language Pathology Treatment  Patient Details  Name: Judy Walter MRN: [id number] Date of Birth: 16-May-1971 Referring Provider (SLP): Oretha Milch (3W); Glenda Chroman, MD - PCP)   Encounter Date: 03/20/2019  End of Session - 03/20/19 1003    Visit Number  6    Number of Visits  17    Date for SLP Re-Evaluation  05/20/19    SLP Start Time  0903    SLP Stop Time   0947    SLP Time Calculation (min)  44 min    Activity Tolerance  Patient tolerated treatment well       Past Medical History:  Diagnosis Date  . Abnormal Pap smear of cervix 06/2015   ASCUS   . Cervical dysplasia 2017   colposcopic biopsy  . Elevated cholesterol   . Leukemia [facility])    age 55, s/p chemotherapy  . Migraines   . Osteopenia 08/2014  . Personal history of venous thrombosis and embolism   . Stroke ([place]) 02/06/2019   acute left basal ganglia  . Vitamin D deficiency     Past Surgical History:  Procedure Laterality Date  . C-sections  2002, 2008  . DILATION AND CURETTAGE OF UTERUS  1997  . wisdom teeth extracted      There were no vitals filed for this visit.  Subjective Assessment - 03/20/19 0906    Subjective  "I had a chance to meet with my supervisor."    Currently in Pain?  No/denies            ADULT SLP TREATMENT - 03/20/19 0906      General Information   Behavior/Cognition  Alert;Cooperative;Pleasant mood      Treatment Provided   Treatment provided  Cognitive-Linquistic      Cognitive-Linquistic Treatment   Treatment focused on  Aphasia    Skilled Treatment  Pt had a chance to talk to supervisor and to fellow RN, Crystal. Pt told SLP she was able to participate at 8/10 (where 10=WNL premorbidly). SLP and pt spoke with mod complex/complex conversation  and pt with WFL/WNL language production. Pt reports when  she types thoughts it is slower due to processing language and then focus on correct typing. SLP had pt type out a summary of her process of getting her [employer] paperwork to the point it can be sent off/faxed off. Pt typed 6 sentences in 15 minutes, but stated she didn't think that took her any more time than it would have premorbily. Pt retuns to work next week and arrives to [place] the next day. Homework to discuss case studes with Crystal and take notes and then write 6-8 sentence summaries of each study.      Assessment / Recommendations / Plan   Plan  Continue with current plan of care      Progression Toward Goals   Progression toward goals  Progressing toward goals         SLP Short Term Goals - 03/20/19 1004      SLP SHORT TERM GOAL #1   Title  pt will name average 12 items from a personally relevant category in one minute, in three sessions    Time  2    Period  Weeks    Status  On-going      SLP SHORT TERM GOAL #2   Title  pt will  use compensatory strategies in 8 minutes simple conversation to allow for WNL communication over two sessions    Status  Achieved      SLP [place] #3   Title  pt will write information in short lists with error awareness at 100%    Status  Achieved       SLP Long Term Goals - 03/20/19 1005      SLP LONG TERM GOAL #1   Title  pt will use compensatory strategies in 10 minutes mod complex conversation to allow for WNL communication over 2 sessions    Baseline  03-13-19, 03-20-19    Time  6    Period  Weeks    Status  On-going      SLP LONG TERM GOAL #2   Title  pt will use compensatory strategies in 10 minutes mod complex/complex conversation to allow for WNL communication over three sessions    Baseline  03-20-19    Time  6    Period  Weeks    Status  On-going   from "two sessions" to "3 sessions"     SLP LONG TERM GOAL #3   Title  pt will demo understanding of 10 minutes mod complex/complex conversation with modified independence over  three sessions    Baseline  03-20-19    Time  6    Period  Weeks    Status  On-going      SLP LONG TERM GOAL #4   Title  pt will demo functional written communication in texts, notes, and other short written info over four sessions    Baseline  03-20-19    Time  6    Period  Weeks    Status  On-going      SLP LONG TERM GOAL #5   Title  pt will write work-like information (mod complex/complex) with error awareness at 100% x3 sessions    Baseline  03-20-19    Time  6    Period  Weeks    Status  New       Plan - 03/20/19 1004    Clinical Impression Statement  Pt with mild expressive aphasia resolving/resolved, but appearedly stil lingering in pt's written langauge. See "skilled intervention" for details. Pt will benefit from skilled ST at x1/week to improve receptive and expressive langugage skills to return to work as a Therapist, sports as well as to communicate in the community and at home.    Speech Therapy Frequency  1x /week    Duration  --   8 weeks or 17 sessions   Treatment/Interventions  Compensatory techniques;Functional tasks;Multimodal communcation approach;Language facilitation;Cueing hierarchy;SLP instruction and feedback;Patient/family education;Internal/external aids    Potential to Achieve Goals  Good    Consulted and Agree with Plan of Care  Patient       Patient will benefit from skilled therapeutic intervention in order to improve the following deficits and impairments:   1. Aphasia       Problem List Patient Active Problem List   Diagnosis Date Noted  . Cryptogenic stroke ([place]) 03/18/2019  . Palpitations 03/11/2019  . Infarction of left basal ganglia ([place]) 02/06/2019  . HYPERLIPIDEMIA TYPE I / IV 04/14/2009  . PULMONARY EMBOLISM, HX OF 04/03/2009    [facility] ,MS, CCC-SLP  03/20/2019, 10:06 AM  [facility] [address] [place], [place], [address] Phone: [phone]   Fax:  [phone]   Name: Judy Walter MRN: [id number] Date of Birth:  07/22/1971 

## 2019-03-25 ENCOUNTER — Encounter: Payer: 59 | Admitting: Speech Pathology

## 2019-03-26 ENCOUNTER — Ambulatory Visit: Payer: 59 | Admitting: Obstetrics and Gynecology

## 2019-03-26 ENCOUNTER — Other Ambulatory Visit: Payer: Self-pay

## 2019-03-26 ENCOUNTER — Encounter: Payer: Self-pay | Admitting: Obstetrics and Gynecology

## 2019-03-26 VITALS — BP 106/68 | HR 72 | Temp 97.3°F | Resp 12 | Ht 64.5 in | Wt 148.0 lb

## 2019-03-26 DIAGNOSIS — Z01812 Encounter for preprocedural laboratory examination: Secondary | ICD-10-CM

## 2019-03-26 DIAGNOSIS — R87610 Atypical squamous cells of undetermined significance on cytologic smear of cervix (ASC-US): Secondary | ICD-10-CM

## 2019-03-26 DIAGNOSIS — N870 Mild cervical dysplasia: Secondary | ICD-10-CM | POA: Diagnosis not present

## 2019-03-26 DIAGNOSIS — N760 Acute vaginitis: Secondary | ICD-10-CM | POA: Diagnosis not present

## 2019-03-26 DIAGNOSIS — N72 Inflammatory disease of cervix uteri: Secondary | ICD-10-CM | POA: Diagnosis not present

## 2019-03-26 NOTE — Progress Notes (Signed)
  Subjective:     Patient ID: Judy Rockers., female   DOB: 1970/10/01, 48 y.o.   MRN: [id number]  HPI  Pap History: 11/27/18 ASCUS:Neg HR HPV; 11/02/17 Pap and HR HPV negative; 12/8/17colpo biopsies showing LGSIL; 10/20/17ASCUS and negative HR HPV 11/4/16biopsy of exocervix - LGSIL and ECC benign;06-07-15 ASCUS:Neg HR HPV with colposcopy revealing LGSIL of exocervix and Neg ECC. 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 Consent for procedure.  3% acetic acid used in vagina and on cervix. White light and green light filter used.  Colposcopy satisfactory:  Yes   _x____          No    _____ Findings:   No acetowhite lesions noted.  General atrophy appreciated. Cervix/vagina:  Decreased Lugol's update circumferentially around the cervix and extending tot he posterior vaginal cuff.  Biopsies:  ECC, 2:00, 8:00, and posterior vaginal cuff.  Monsel's placed.  Minimal EBL. No complications.      Assessment:     ASCUS pap.  Hx LGSIL.  Atrophy.  Hx PE.  Hx recent stroke.     Plan:     Discussion of abnormal pap and atrophy.  Patient is not a candidate for estrogen tx due to hx PE and recent stroke.  Fu biopsy results.  Anticipate pap and HR HPV testing in one year.

## 2019-03-26 NOTE — Patient Instructions (Signed)
Colposcopy, Care After This sheet gives you information about how to care for yourself after your procedure. Your doctor may also give you more specific instructions. If you have problems or questions, contact your doctor. What can I expect after the procedure? If you did not have a tissue sample removed (did not have a biopsy), you may only have some spotting for a few days. You can go back to your normal activities. If you had a tissue sample removed, it is common to have:  Soreness and pain. This may last for a few days.  Light-headedness.  Mild bleeding from your vagina or dark-colored, grainy discharge from your vagina. This may last for a few days. You may need to wear a sanitary pad.  Spotting for at least 48 hours after the procedure. Follow these instructions at home:   Take over-the-counter and prescription medicines only as told by your doctor. Ask your doctor what medicines you can start taking again. This is very important if you take blood-thinning medicine.  Do not drive or use heavy machinery while taking prescription pain medicine.  For 3 days, or as long as your doctor tells you, avoid: ? Douching. ? Using tampons. ? Having sex.  If you use birth control (contraception), keep using it.  Limit activity for the first day after the procedure. Ask your doctor what activities are safe for you.  It is up to you to get the results of your procedure. Ask your doctor when your results will be ready.  Keep all follow-up visits as told by your doctor. This is important. Contact a doctor if:  You get a skin rash. Get help right away if:  You are bleeding a lot from your vagina. It is a lot of bleeding if you are using more than one pad an hour for 2 hours in a row.  You have clumps of blood (blood clots) coming from your vagina.  You have a fever.  You have chills  You have pain in your lower belly (pelvic area).  You have signs of infection, such as vaginal  discharge that is: ? Different than usual. ? Yellow. ? Bad-smelling.  You have very pain or cramps in your lower belly that do not get better with medicine.  You feel light-headed.  You feel dizzy.  You pass out (faint). Summary  If you did not have a tissue sample removed (did not have a biopsy), you may only have some spotting for a few days. You can go back to your normal activities.  If you had a tissue sample removed, it is common to have mild pain and spotting for 48 hours.  For 3 days, or as long as your doctor tells you, avoid douching, using tampons and having sex.  Get help right away if you have bleeding, very bad pain, or signs of infection. This information is not intended to replace advice given to you by your health care provider. Make sure you discuss any questions you have with your health care provider. Document Released: 02/14/2008 Document Revised: 08/10/2017 Document Reviewed: 05/17/2016 Elsevier Patient Education  2020 [employer] Inc[employer]  

## 2019-03-28 ENCOUNTER — Ambulatory Visit: Payer: 59

## 2019-03-31 ENCOUNTER — Telehealth: Payer: Self-pay | Admitting: Student

## 2019-03-31 LAB — POCT URINE PREGNANCY: Preg Test, Ur: NEGATIVE

## 2019-03-31 NOTE — Telephone Encounter (Signed)
Pt answered no to all covid 19 screening questions      COVID-19 Pre-Screening Questions:  . In the past 7 to 10 days have you had a cough,  shortness of breath, headache, congestion, fever (100 or greater) body aches, chills, sore throat, or sudden loss of taste or sense of smell? . Have you been around anyone with known Covid 19. . Have you been around anyone who is awaiting Covid 19 test results in the past 7 to 10 days? . Have you been around anyone who has been exposed to Covid 19, or has mentioned symptoms of Covid 19 within the past 7 to 10 days?  If you have any concerns/questions about symptoms patients report during screening (either on the phone or at threshold). Contact the provider seeing the patient or DOD for further guidance.  If neither are available contact a member of the leadership team.

## 2019-04-01 ENCOUNTER — Other Ambulatory Visit: Payer: Self-pay

## 2019-04-01 ENCOUNTER — Ambulatory Visit: Payer: 59

## 2019-04-01 ENCOUNTER — Ambulatory Visit (INDEPENDENT_AMBULATORY_CARE_PROVIDER_SITE_OTHER): Payer: 59 | Admitting: Student

## 2019-04-01 DIAGNOSIS — I639 Cerebral infarction, unspecified: Secondary | ICD-10-CM

## 2019-04-01 DIAGNOSIS — R4701 Aphasia: Secondary | ICD-10-CM

## 2019-04-01 LAB — CUP PACEART INCLINIC DEVICE CHECK
Date Time Interrogation Session: 20200721123555
Implantable Pulse Generator Implant Date: 20200707

## 2019-04-01 MED FILL — VENLAFAXINE HCL ER 37.5 MG: 37.5 | 30 days supply | Qty: 30 | Fill #2

## 2019-04-01 NOTE — Progress Notes (Signed)
ILR wound check in clinic. Steri strips previously fell off. Wound well healed. Mild ecchymosis on her L breast. Home monitor transmitting nightly. No episodes. Questions answered.   Pt having muscle aches on Atorvastatin 80 mg daily. Most recent LDL from PCP 03/2019 as below. Will forward to Dr. Lovena Le for further with h/o CVA.  Chol 171 TGs 84  LDL 91  VLDL 17 HDL [address] [place], [place] 04/01/2019 12:36 PM

## 2019-04-01 NOTE — Therapy (Signed)
[facility] [address] [place], [place], [address] Phone: [phone]   Fax:  [phone]  Speech Language Pathology Treatment/Discharge Summary  Patient Details  Name: Judy Walter MRN: [id number] Date of Birth: [birth year] Referring Provider (SLP): Oretha Milch (3W); Glenda Chroman, MD - PCP)   Encounter Date: 04/01/2019  End of Session - 04/01/19 1210    Visit Number  7    Number of Visits  17    Date for SLP Re-Evaluation  05/20/19    SLP Start Time  1104    SLP Stop Time   1146    SLP Time Calculation (min)  42 min    Activity Tolerance  Patient tolerated treatment well       Past Medical History:  Diagnosis Date  . Abnormal Pap smear of cervix 06/2015   ASCUS   . Cervical dysplasia 2017   colposcopic biopsy  . Elevated cholesterol   . Leukemia [facility])    age 53, s/p chemotherapy  . Migraines   . Osteopenia 08/2014  . Personal history of venous thrombosis and embolism   . Stroke ([facility] [place]) 02/06/2019   acute left basal ganglia  . Vitamin D deficiency     Past Surgical History:  Procedure Laterality Date  . C-sections  2002, 2008  . DILATION AND CURETTAGE OF UTERUS  1997  . wisdom teeth extracted      There were no vitals filed for this visit.  Subjective Assessment - 04/01/19 1106    Subjective  "It went well!" (pt re: work this weekend)    Currently in Pain?  No/denies            ADULT SLP TREATMENT - 04/01/19 1107      General Information   Behavior/Cognition  Alert;Cooperative;Pleasant mood      Treatment Provided   Treatment provided  Cognitive-Linquistic      Cognitive-Linquistic Treatment   Treatment focused on  Aphasia    Skilled Treatment  Pt reports everything with work went well, and by her last day of work over the weekend thought she could handle the 12 hours shift. 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 SLP and pt talked for 12 minutes in mod complex/complex conversation with pt WNL language. She generated a 4 sentence text to her hustand with mild extra time but 98% correct (left out "r" in "four"). Pt thinks she is appropriate for d/c at this time. SLP agrees.      Assessment / Recommendations / Plan   Plan  Continue with current plan of care;Discharge SLP treatment due to (comment)   pt satisfied with progress     Progression Toward Goals   Progression toward goals  --   see goal summary - d/c day        SLP Short Term Goals - 03/20/19 1004      SLP SHORT TERM GOAL #1   Title  pt will name average 12 items from a personally relevant category in one minute, in three sessions    Time  2    Period  Weeks    Status  On-going      SLP SHORT TERM GOAL #2   Title  pt will use compensatory strategies in 8 minutes simple conversation to allow for WNL communication over two sessions    Status  Achieved  SLP SHORT TERM GOAL #3   Title  pt will write information in short lists with error awareness at 100%    Status  Achieved       SLP Long Term Goals - 04/01/19 1124      SLP LONG TERM GOAL #1   Title  pt will use compensatory strategies in 10 minutes mod complex conversation to allow for WNL communication over 2 sessions    Baseline  03-13-19, 03-20-19    Status  Achieved      SLP LONG TERM GOAL #2   Title  pt will use compensatory strategies in 10 minutes mod complex/complex conversation to allow for WNL communication over three sessions    Baseline  03-20-19, 04-01-19    Status  Partially Met   from "two sessions" to "3 sessions"     SLP LONG TERM GOAL #3   Title  pt will demo understanding of 10 minutes mod complex/complex conversation with modified independence over three sessions    Baseline  03-20-19, 04-01-19    Status  Partially Met      SLP LONG TERM GOAL #4   Title  pt will demo functional written communication in texts,  notes, and other short written info over four sessions    Baseline  03-20-19, 04-01-19    Time  5    Period  Weeks    Status  Partially Met      SLP LONG TERM GOAL #5   Title  pt will write work-like information (mod complex/complex) with error awareness at 100% x3 sessions    Baseline  03-20-19, 04-01-19    Time  6    Period  Weeks    Status  Partially Met       Plan - 04/01/19 1211    Clinical Impression Statement  Pt with mild expressive aphasia mostly resolved in spoken language, but pt cont to report mild slowing in pt's written langauge. See "skilled intervention" for details. Pt is satisfied with progress and will be d/c'd today.    Treatment/Interventions  Compensatory techniques;Functional tasks;Multimodal communcation approach;Language facilitation;Cueing hierarchy;SLP instruction and feedback;Patient/family education;Internal/external aids    Potential to Achieve Goals  Good    Consulted and Agree with Plan of Care  Patient       Patient will benefit from skilled therapeutic intervention in order to improve the following deficits and impairments:   1. Aphasia      SPEECH THERAPY DISCHARGE SUMMARY  Visits from Start of Care: 7  Current functional level related to goals / functional outcomes: See goal summary. Pt partially met/met all LTGs.    Remaining deficits: Mild written aphasia.   Education / Equipment: Work Leisure centre manager: Patient agrees to discharge.  Patient goals were partially met. Patient is being discharged due to being pleased with the current functional level.  ?????     7  Problem List Patient Active Problem List   Diagnosis Date Noted  . Cryptogenic stroke ([place]) 03/18/2019  . Palpitations 03/11/2019  . Infarction of left basal ganglia ([place]) 02/06/2019  . HYPERLIPIDEMIA TYPE I / IV 04/14/2009  . PULMONARY EMBOLISM, HX OF 04/03/2009    , .csc  04/01/2019, 12:12 PM  [facility] [address] [place], [place], [address] Phone: [phone]   Fax:  [phone]   Name: Judy Walter MRN: [id number] Date of Birth: November 09, 1970

## 2019-04-02 ENCOUNTER — Telehealth: Payer: Self-pay | Admitting: Student

## 2019-04-02 MED ORDER — ATORVASTATIN CALCIUM 40 MG PO TABS: 40.0000 mg | ORAL_TABLET | Freq: Every day | ORAL | 3 refills | Status: DC

## 2019-04-02 NOTE — Telephone Encounter (Signed)
-----   Message from Evans Lance, MD sent at 04/01/2019  4:52 PM EDT ----- Arloa Koh reduce her dose in half. GT ----- Message ----- From: Shirley Friar, Hershal Coria Sent: 04/01/2019  12:37 PM EDT To: Evans Lance, MD  ILR wound check. Site looks great with mild bruising along her L breast. Her atorvastatin was increased s/p CVA and she is having muscle aches and would like to consider an alternative vs lower dose.  Thank you!

## 2019-04-02 NOTE — Telephone Encounter (Signed)
Discussed with patient. Will reduce atorvastatin to 40 mg daily. Order sent to [facility].   Legrand Como [address] [place], PA-C 04/02/2019 11:39 AM

## 2019-04-03 ENCOUNTER — Telehealth: Payer: Self-pay | Admitting: Diagnostic Neuroimaging

## 2019-04-03 NOTE — Telephone Encounter (Signed)
Left message on pt's VM in regards to payment for disability forms

## 2019-04-08 ENCOUNTER — Ambulatory Visit: Payer: 59 | Admitting: Speech Pathology

## 2019-04-09 ENCOUNTER — Telehealth: Payer: Self-pay | Admitting: *Deleted

## 2019-04-09 NOTE — Telephone Encounter (Signed)
[place] forms completed, signed and sent to medical records for processing.

## 2019-04-09 NOTE — Telephone Encounter (Signed)
Called patient to discuss disability forms received. She returned to work 03/24/19 and was discharged by speech therapist on 04/01/19. She sated she is doing well, back to baseline with much improvement. I advised her we will complete disability forms. She verbalized understanding, appreciation. The [employer] Disability forms on Dr [employer] desk for review, completion, signature.

## 2019-04-10 ENCOUNTER — Telehealth: Payer: Self-pay | Admitting: *Deleted

## 2019-04-10 NOTE — Telephone Encounter (Signed)
-----   Message from Nunzio Cobbs, MD sent at 04/04/2019  3:57 PM EDT ----- Please contact patient with results of colposcopic biopsies.  The biopsies showed squamous atypia and one biopsy suggested low grade dysplasia.  No treatment is indicated.  I recommend she have her next pap and HR HPV in March, 2021. Please place in recall.

## 2019-04-10 NOTE — Telephone Encounter (Signed)
Spoke with patient, advised as seen below per Dr. Quincy Simmonds. Patient verbalizes understanding and is agreeable.   Next AEX 12/05/19.   Encounter closed.

## 2019-04-10 NOTE — Telephone Encounter (Signed)
Notes recorded by Burnice Logan, RN on 04/10/2019 at 11:49 AM EDT  Left message to call Sharee Pimple, RN at [facility].   Recall placed for 11/2019  ------

## 2019-04-10 NOTE — Telephone Encounter (Signed)
Pt [place] form faxed on 04/10/19

## 2019-04-20 LAB — CUP PACEART REMOTE DEVICE CHECK
Date Time Interrogation Session: 20200809114034
Implantable Pulse Generator Implant Date: 20200707

## 2019-04-21 ENCOUNTER — Ambulatory Visit (INDEPENDENT_AMBULATORY_CARE_PROVIDER_SITE_OTHER): Payer: 59 | Admitting: *Deleted

## 2019-04-21 DIAGNOSIS — I639 Cerebral infarction, unspecified: Secondary | ICD-10-CM | POA: Diagnosis not present

## 2019-04-23 ENCOUNTER — Ambulatory Visit: Payer: 59

## 2019-04-28 NOTE — Progress Notes (Signed)
Carelink Summary Report / Loop Recorder 

## 2019-04-30 ENCOUNTER — Ambulatory Visit: Payer: 59 | Admitting: Obstetrics and Gynecology

## 2019-05-08 ENCOUNTER — Other Ambulatory Visit: Payer: Self-pay | Admitting: Obstetrics and Gynecology

## 2019-05-08 MED FILL — VENLAFAXINE HCL ER 37.5 MG: 37.5 | 30 days supply | Qty: 30 | Fill #0

## 2019-05-08 NOTE — Telephone Encounter (Signed)
Medication refill request: Effexor  Last AEX:  11-27-2018 BS  Next AEX: 12-05-19  Last MMG (if hormonal medication request): n/a Refill authorized: Today, please advise.   Medication pended for #30, 1RF. Please refill if appropriate.

## 2019-05-20 ENCOUNTER — Encounter: Payer: Self-pay | Admitting: Neurology

## 2019-05-20 ENCOUNTER — Ambulatory Visit: Payer: 59 | Admitting: Neurology

## 2019-05-20 ENCOUNTER — Other Ambulatory Visit: Payer: Self-pay

## 2019-05-20 VITALS — BP 118/72 | HR 75 | Temp 98.2°F | Ht 64.0 in | Wt 146.0 lb

## 2019-05-20 DIAGNOSIS — E782 Mixed hyperlipidemia: Secondary | ICD-10-CM

## 2019-05-20 MED ORDER — COENZYME Q10 30 MG PO CAPS: 200.0000 mg | ORAL_CAPSULE | Freq: Every day | ORAL | 1 refills | Status: DC

## 2019-05-20 NOTE — Progress Notes (Signed)
[facility] [address] [place]. [place] [address] [phone]       OFFICE FOLLOW-UP NOTE  [name]. Date of Birth:  04-22-1971 Medical Record Number:  [id number]   HPI: Judy Walter is a 48 year old Caucasian lady seen today for initial office follow-up visit following hospital admission for stroke in May 2020.  History is obtained from the patient, review of electronic medical records and have personally reviewed imaging films in PACS.  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  She described difficulty finding words as well as slurred speech she presented several days after after her troubles began.  MRI scan of the brain showed a large 2 cm left basal ganglia infarct.  MRA of the head showed no large or medium vessel intracranial stenosis in the anterior circulation.  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  2D echo showed normal ejection fraction without cardiac source of embolism.  Transcranial Doppler bubble study was negative for PFO.  Lower extremity venous Dopplers were negative for DVT.  TEE showed no evidence of PFO or cardiac source of embolism.  Patient had loop recorder inserted.  So for paroxysmal A. fib has not yet been found.  Her LDL cholesterol was elevated at 222 mg percent.  She is started on Lipitor 80 mg daily but states she had muscle aches and pain and hence reduce the dose to 40 mg which she is tolerating better.  Hemoglobin A1c was 5.4.  ANA was negative and hypercoagulable panel labs were sent and all of them were negative.  Patient did have a very remote history of pulmonary embolism but has not been on long-term anticoagulation.  She had 1 miscarriage long time back.  She denies history of sleep apnea and states she did have a sleep study in the past which was negative.  She states her  blood pressure control has been good.  She took aspirin and Plavix for 3 weeks and is currently now on aspirin 81 mg alone which is tolerating well without muscle aches and pains.  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  She has some lack of dexterity and trouble typing fast but otherwise is fine.  When she is tired she occasionally struggles with finding some words but otherwise is returned back to baseline.  There is no family history of stroke or heart attacks at a young age.  She does not smoke, drink alcohol or do drugs.  ROS:   14 system review of systems is positive for speech difficulty, word finding difficulty, muscle aches, joint aches and all other systems negative  PMH:  Past Medical History:  Diagnosis Date   Abnormal Pap smear of cervix 06/2015   ASCUS    Cervical dysplasia 2017   colposcopic biopsy   Elevated cholesterol    Leukemia ([place])    age 89, s/p chemotherapy   Migraines    Osteopenia 08/2014   Personal history of venous thrombosis and embolism    Stroke ([place]) 02/06/2019   acute left basal ganglia   Vitamin D deficiency     Social History:  Social History   Socioeconomic History   Marital status: Married    Spouse name: Not on file   Number of children: Not on file   Years of education: Not on file   Highest  education level: Not on file  Occupational History   Occupation: Programmer, 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    Comment: Mirena inserted 11-10-13   Lifestyle   Physical activity    Days per week: Not on file    Minutes per session: Not on file   Stress: Not on file  Relationships   Social connections    Talks on phone: Not on file    Gets together: Not on file    Attends religious service: Not on file    Active member of club or organization: Not on file    Attends meetings of clubs or organizations: Not on file    Relationship status: Not on file   Intimate partner violence    Fear of current or ex partner: Not on file    Emotionally abused: Not on file    Physically abused: Not on file    Forced sexual activity: Not on file  Other Topics Concern   Not on file  Social History Narrative   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     Medications:   Current Outpatient Medications on File Prior to Visit  Medication Sig Dispense Refill   aspirin EC 81 MG EC tablet Take 1 tablet (81 mg total) by mouth daily. 90 tablet 0   atorvastatin (LIPITOR) 40 MG tablet Take 1 tablet (40 mg total) by mouth daily. 90 tablet 3   b complex vitamins tablet Take 1 tablet by mouth daily.     Cholecalciferol (VITAMIN D3) 5000 UNITS TABS Take 1 tablet by mouth daily.     ibuprofen (ADVIL,MOTRIN) 200 MG tablet Take 400 mg by mouth 2 (two) times daily as needed for headache or moderate pain.      levonorgestrel (MIRENA) 20 MCG/24HR IUD 1 each by Intrauterine route once.     MAGNESIUM PO Take by mouth daily.     Multiple Vitamins-Minerals (MULTIVITAMIN ADULTS PO) Take by mouth.     POTASSIUM PO Take by mouth daily. Powder     venlafaxine XR (EFFEXOR-XR) 37.5 MG 24 hr capsule TAKE 1 CAPSULE (37.5 MG TOTAL) BY MOUTH DAILY. 30 capsule 5   No current facility-administered medications on file prior to visit.     Allergies:   Allergies  Allergen Reactions   Other     Darvocet- hallucinations   Sulfonamide Derivatives     REACTION: fever    Physical Exam General: well developed,  well nourished pleasant middle-age Caucasian lady, seated, in no evident distress Head: head normocephalic and atraumatic.  Neck: supple with no carotid or supraclavicular bruits Cardiovascular: regular rate and rhythm, no murmurs Musculoskeletal: no deformity Skin:  no rash/petichiae Vascular:  Normal pulses all extremities Vitals:   05/20/19 1026  BP: 118/72  Pulse: 75  Temp: 98.2 F (36.8 C)   Neurologic  Exam Mental Status: Awake and fully alert. Oriented to place and time. Recent and remote memory intact. Attention span, concentration and fund of knowledge appropriate. Mood and affect appropriate.  Able to name 13 animals which can walk on 4 legs.  Normal naming, repetition and comprehension.  Speech is fluent. Cranial Nerves: Fundoscopic exam reveals sharp disc margins. Pupils equal, briskly reactive to light. Extraocular movements full without nystagmus. Visual fields full to confrontation. Hearing intact. Facial sensation intact. Face, tongue, palate moves normally and symmetrically.  Motor: Normal bulk and tone. Normal strength in all tested extremity muscles. Sensory.: intact to touch ,pinprick .position and vibratory sensation.  Coordination: Rapid alternating movements normal in all extremities. Finger-to-nose and heel-to-shin performed accurately bilaterally. Gait and Station: Arises from chair without difficulty. Stance is normal. Gait demonstrates normal stride length and balance . Able to heel, toe and tandem walk without difficulty.  Reflexes: 1+ and symmetric. Toes downgoing.   NIHSS  0 Modified Rankin 1   ASSESSMENT: 48 year old Caucasian lady with large left basal ganglia infarct in May 2020 of cryptogenic etiology.  Vascular risk factors of hyperlipidemia only.     PLAN: I had a long d/w patient about her recent cryptogenic stroke, risk for recurrent stroke/TIAs, personally independently reviewed imaging studies and stroke evaluation results and answered  questions.Continue aspirin 81 mg daily  for secondary stroke prevention and maintain strict control of hypertension with blood pressure goal below 130/90, diabetes with hemoglobin A1c goal below 6.5% and lipids with LDL cholesterol goal below 70 mg/dL. I also advised the patient to eat a healthy diet with plenty of whole grains, cereals, fruits and vegetables, exercise regularly and maintain ideal body weight .I advised the patient to start taking co-Q10 200 mg daily to help with statin myalgias and to check follow-up lipid panel today.  Patient may also consider possible participation in the [place] trial for stroke prevention and cryptogenic stroke.  Followup in the future with me in  6 months or call earlier if necessary. Greater than 50% of time during this 25 minute visit was spent on counseling,explanation of diagnosis of cryptogenic stroke, planning of further management, discussion with patient and family and coordination of care Antony Contras, MD  [facility] [address] [place], [place] [address]  Phone [phone] Fax [phone] Note: This document was prepared with digital dictation and possible smart phrase technology. Any transcriptional errors that result from this process are unintentional

## 2019-05-20 NOTE — Patient Instructions (Signed)
I had a long d/w patient about her recent cryptogenic stroke, risk for recurrent stroke/TIAs, personally independently reviewed imaging studies and stroke evaluation results and answered questions.Continue aspirin 81 mg daily  for secondary stroke prevention and maintain strict control of hypertension with blood pressure goal below 130/90, diabetes with hemoglobin A1c goal below 6.5% and lipids with LDL cholesterol goal below 70 mg/dL. I also advised the patient to eat a healthy diet with plenty of whole grains, cereals, fruits and vegetables, exercise regularly and maintain ideal body weight .I advised the patient to start taking co-Q10 200 mg daily to help with statin myalgias and to check follow-up lipid panel today.  Patient may also consider possible participation in the [place] trial for stroke prevention and cryptogenic stroke.  Followup in the future with me in  6 months or call earlier if necessary.  Stroke Prevention Some medical conditions and behaviors are associated with a higher chance of having a stroke. You can help prevent a stroke by making nutrition, lifestyle, and other changes, including managing any medical conditions you may have. What nutrition changes can be made?   Eat healthy foods. You can do this by: ? Choosing foods high in fiber, such as fresh fruits and vegetables and whole grains. ? Eating at least 5 or more servings of fruits and vegetables a day. Try to fill half of your plate at each meal with fruits and vegetables. ? Choosing lean protein foods, such as lean cuts of meat, poultry without skin, fish, tofu, beans, and nuts. ? Eating low-fat dairy products. ? Avoiding foods that are high in salt (sodium). This can help lower blood pressure. ? Avoiding foods that have saturated fat, trans fat, and cholesterol. This can help prevent high cholesterol. ? Avoiding processed and premade foods.  Follow your health care provider's specific guidelines for losing weight,  controlling high blood pressure (hypertension), lowering high cholesterol, and managing diabetes. These may include: ? Reducing your daily calorie intake. ? Limiting your daily sodium intake to 1,500 milligrams (mg). ? Using only healthy fats for cooking, such as olive oil, canola oil, or sunflower oil. ? Counting your daily carbohydrate intake. What lifestyle changes can be made?  Maintain a healthy weight. Talk to your health care provider about your ideal weight.  Get at least 30 minutes of moderate physical activity at least 5 days a week. Moderate activity includes brisk walking, biking, and swimming.  Do not use any products that contain nicotine or tobacco, such as cigarettes and e-cigarettes. If you need help quitting, ask your health care provider. It may also be helpful to avoid exposure to secondhand smoke.  Limit alcohol intake to no more than 1 drink a day for nonpregnant women and 2 drinks a day for men. One drink equals 12 oz of beer, 5 oz of wine, or 1 oz of hard liquor.  Stop any illegal drug use.  Avoid taking birth control pills. Talk to your health care provider about the risks of taking birth control pills if: ? You are over 83 years old. ? You smoke. ? You get migraines. ? You have ever had a blood clot. What other changes can be made?  Manage your cholesterol levels. ? Eating a healthy diet is important for preventing high cholesterol. If cholesterol cannot be managed through diet alone, you may also need to take medicines. ? Take any prescribed medicines to control your cholesterol as told by your health care provider.  Manage your diabetes. ? Eating a  healthy diet and exercising regularly are important parts of managing your blood sugar. If your blood sugar cannot be managed through diet and exercise, you may need to take medicines. ? Take any prescribed medicines to control your diabetes as told by your health care provider.  Control your hypertension. ? To  reduce your risk of stroke, try to keep your blood pressure below 130/80. ? Eating a healthy diet and exercising regularly are an important part of controlling your blood pressure. If your blood pressure cannot be managed through diet and exercise, you may need to take medicines. ? Take any prescribed medicines to control hypertension as told by your health care provider. ? Ask your health care provider if you should monitor your blood pressure at home. ? Have your blood pressure checked every year, even if your blood pressure is normal. Blood pressure increases with age and some medical conditions.  Get evaluated for sleep disorders (sleep apnea). Talk to your health care provider about getting a sleep evaluation if you snore a lot or have excessive sleepiness.  Take over-the-counter and prescription medicines only as told by your health care provider. Aspirin or blood thinners (antiplatelets or anticoagulants) may be recommended to reduce your risk of forming blood clots that can lead to stroke.  Make sure that any other medical conditions you have, such as atrial fibrillation or atherosclerosis, are managed. What are the warning signs of a stroke? The warning signs of a stroke can be easily remembered as BEFAST.  B is for balance. Signs include: ? Dizziness. ? Loss of balance or coordination. ? Sudden trouble walking.  E is for eyes. Signs include: ? A sudden change in vision. ? Trouble seeing.  F is for face. Signs include: ? Sudden weakness or numbness of the face. ? The face or eyelid drooping to one side.  A is for arms. Signs include: ? Sudden weakness or numbness of the arm, usually on one side of the body.  S is for speech. Signs include: ? Trouble speaking (aphasia). ? Trouble understanding.  T is for time. ? These symptoms may represent a serious problem that is an emergency. Do not wait to see if the symptoms will go away. Get medical help right away. Call your local  emergency services (911 in the [place] Do not drive yourself to the hospital.  Other signs of stroke may include: ? A sudden, severe headache with no known cause. ? Nausea or vomiting. ? Seizure. Where to find more information For more information, visit:  American Stroke Association: www.strokeassociation.org  National Stroke Association: www.stroke.org Summary  You can prevent a stroke by eating healthy, exercising, not smoking, limiting alcohol intake, and managing any medical conditions you may have.  Do not use any products that contain nicotine or tobacco, such as cigarettes and e-cigarettes. If you need help quitting, ask your health care provider. It may also be helpful to avoid exposure to secondhand smoke.  Remember BEFAST for warning signs of stroke. Get help right away if you or a loved one has any of these signs. This information is not intended to replace advice given to you by your health care provider. Make sure you discuss any questions you have with your health care provider. Document Released: 10/05/2004 Document Revised: 08/10/2017 Document Reviewed: 10/03/2016 Elsevier Patient Education  2020 [employer].

## 2019-05-21 ENCOUNTER — Other Ambulatory Visit (HOSPITAL_COMMUNITY): Payer: Self-pay | Admitting: Neurology

## 2019-05-21 LAB — LIPID PANEL
Chol/HDL Ratio: 3.1 ratio (ref 0.0–4.4)
Cholesterol, Total: 214 mg/dL — ABNORMAL HIGH (ref 100–199)
HDL: 68 mg/dL (ref >39)
LDL Chol Calc (NIH): 136 mg/dL — ABNORMAL HIGH (ref 0–99)
Triglycerides: 57 mg/dL (ref 0–149)
VLDL Cholesterol Cal: 10 mg/dL (ref 5–40)

## 2019-05-21 LAB — HEPATIC FUNCTION PANEL
ALT: 89 IU/L — ABNORMAL HIGH (ref 0–32)
AST: 45 IU/L — ABNORMAL HIGH (ref 0–40)
Albumin: 4.7 g/dL (ref 3.8–4.8)
Alkaline Phosphatase: 141 IU/L — ABNORMAL HIGH (ref 39–117)
Bilirubin Total: 0.5 mg/dL (ref 0.0–1.2)
Bilirubin, Direct: 0.13 mg/dL (ref 0.00–0.40)
Total Protein: 6.9 g/dL (ref 6.0–8.5)

## 2019-05-21 MED ORDER — ROSUVASTATIN CALCIUM 5 MG PO TABS: 5.0000 mg | ORAL_TABLET | Freq: Every day | ORAL | 11 refills | Status: DC

## 2019-05-21 MED FILL — ROSUVASTATIN CALCIUM 5 MG T: 5 | 30 days supply | Qty: 30 | Fill #0

## 2019-05-22 ENCOUNTER — Telehealth: Payer: Self-pay

## 2019-05-22 NOTE — Telephone Encounter (Signed)
Garvin Fila, MD         I called the patient and gave results of lipid profile showing LDL cholesterol is 136 mg percent which is not acceptable and liver enzymes are also slightly elevated on Lipitor 40 mg. I recommend she discontinue it and instead take Crestor 5 mg daily. I also advised her to take co-Q10 200 mg daily. If she has trouble tolerating this as well may switch her to the injectable new cholesterol medications Praluent or Repatha in the future. She voiced understanding.

## 2019-05-23 ENCOUNTER — Ambulatory Visit (INDEPENDENT_AMBULATORY_CARE_PROVIDER_SITE_OTHER): Payer: 59 | Admitting: *Deleted

## 2019-05-23 DIAGNOSIS — I639 Cerebral infarction, unspecified: Secondary | ICD-10-CM

## 2019-05-24 LAB — CUP PACEART REMOTE DEVICE CHECK
Date Time Interrogation Session: 20200911120700
Implantable Pulse Generator Implant Date: 20200707

## 2019-05-28 ENCOUNTER — Ambulatory Visit: Payer: 59

## 2019-05-30 NOTE — Progress Notes (Signed)
Carelink Summary Report / Loop Recorder 

## 2019-06-13 ENCOUNTER — Other Ambulatory Visit: Payer: Self-pay

## 2019-06-13 NOTE — Telephone Encounter (Signed)
Erroneous encounter

## 2019-06-16 ENCOUNTER — Telehealth: Payer: Self-pay | Admitting: Obstetrics and Gynecology

## 2019-06-16 MED FILL — VENLAFAXINE HCL ER 37.5 MG: 37.5 | 30 days supply | Qty: 30 | Fill #1

## 2019-06-16 NOTE — Telephone Encounter (Signed)
Patient has enough refills per pharmacist at [facility]. Patient has been notified and will pick up prescription. Closing encounter.

## 2019-06-16 NOTE — Telephone Encounter (Signed)
Patient states pharmacy send in refill request for Effexor, the patient is out of medication and needs this sent in today.

## 2019-06-25 ENCOUNTER — Ambulatory Visit (INDEPENDENT_AMBULATORY_CARE_PROVIDER_SITE_OTHER): Payer: 59 | Admitting: *Deleted

## 2019-06-25 DIAGNOSIS — I639 Cerebral infarction, unspecified: Secondary | ICD-10-CM | POA: Diagnosis not present

## 2019-06-25 LAB — CUP PACEART REMOTE DEVICE CHECK
Date Time Interrogation Session: 20201014120711
Implantable Pulse Generator Implant Date: 20200707

## 2019-07-07 NOTE — Progress Notes (Signed)
Carelink Summary Report / Loop Recorder 

## 2019-07-11 ENCOUNTER — Ambulatory Visit
Admission: RE | Admit: 2019-07-11 | Discharge: 2019-07-11 | Disposition: A | Discharge status: Home / Self Care | Payer: 59 | Source: Ambulatory Visit | Attending: Obstetrics and Gynecology | Admitting: Obstetrics and Gynecology

## 2019-07-11 ENCOUNTER — Other Ambulatory Visit: Payer: Self-pay

## 2019-07-11 DIAGNOSIS — Z1231 Encounter for screening mammogram for malignant neoplasm of breast: Secondary | ICD-10-CM | POA: Diagnosis not present

## 2019-07-15 MED FILL — VENLAFAXINE HCL ER 37.5 MG: 37.5 | 30 days supply | Qty: 30 | Fill #2

## 2019-07-28 ENCOUNTER — Ambulatory Visit (INDEPENDENT_AMBULATORY_CARE_PROVIDER_SITE_OTHER): Payer: 59 | Admitting: *Deleted

## 2019-07-28 DIAGNOSIS — I639 Cerebral infarction, unspecified: Secondary | ICD-10-CM

## 2019-07-29 LAB — CUP PACEART REMOTE DEVICE CHECK
Date Time Interrogation Session: 20201116170411
Implantable Pulse Generator Implant Date: 20200707

## 2019-08-18 MED FILL — VENLAFAXINE HCL ER 37.5 MG: 37.5 | 30 days supply | Qty: 30 | Fill #3

## 2019-08-21 NOTE — Progress Notes (Signed)
Carelink Summary Report / Loop Recorder 

## 2019-09-01 ENCOUNTER — Ambulatory Visit (INDEPENDENT_AMBULATORY_CARE_PROVIDER_SITE_OTHER): Payer: 59 | Admitting: *Deleted

## 2019-09-01 DIAGNOSIS — H109 Unspecified conjunctivitis: Secondary | ICD-10-CM | POA: Diagnosis not present

## 2019-09-01 DIAGNOSIS — J069 Acute upper respiratory infection, unspecified: Secondary | ICD-10-CM | POA: Diagnosis not present

## 2019-09-01 DIAGNOSIS — I639 Cerebral infarction, unspecified: Secondary | ICD-10-CM

## 2019-09-01 LAB — CUP PACEART REMOTE DEVICE CHECK
Date Time Interrogation Session: 20201219120417
Implantable Pulse Generator Implant Date: 20200707

## 2019-09-13 MED FILL — VENLAFAXINE HCL ER 37.5 MG: 37.5 | 30 days supply | Qty: 30 | Fill #4

## 2019-10-06 ENCOUNTER — Ambulatory Visit (INDEPENDENT_AMBULATORY_CARE_PROVIDER_SITE_OTHER): Payer: 59 | Admitting: *Deleted

## 2019-10-06 DIAGNOSIS — I639 Cerebral infarction, unspecified: Secondary | ICD-10-CM

## 2019-10-06 LAB — CUP PACEART REMOTE DEVICE CHECK
Date Time Interrogation Session: 20210124235902
Implantable Pulse Generator Implant Date: 20200707

## 2019-10-16 ENCOUNTER — Telehealth: Payer: Self-pay | Admitting: Obstetrics and Gynecology

## 2019-10-16 NOTE — Telephone Encounter (Signed)
Patient will call back to reschedule cancelled appointment because provider schedule change.

## 2019-10-21 MED FILL — VENLAFAXINE HCL ER 37.5 MG: 37.5 | 30 days supply | Qty: 30 | Fill #5

## 2019-11-10 ENCOUNTER — Ambulatory Visit (INDEPENDENT_AMBULATORY_CARE_PROVIDER_SITE_OTHER): Payer: 59 | Admitting: *Deleted

## 2019-11-10 DIAGNOSIS — I639 Cerebral infarction, unspecified: Secondary | ICD-10-CM

## 2019-11-10 LAB — CUP PACEART REMOTE DEVICE CHECK
Date Time Interrogation Session: 20210301003903
Implantable Pulse Generator Implant Date: 20200707

## 2019-11-10 NOTE — Progress Notes (Signed)
ILR Remote 

## 2019-11-17 ENCOUNTER — Ambulatory Visit (INDEPENDENT_AMBULATORY_CARE_PROVIDER_SITE_OTHER): Payer: 59 | Admitting: Neurology

## 2019-11-17 ENCOUNTER — Encounter: Payer: Self-pay | Admitting: Neurology

## 2019-11-17 ENCOUNTER — Other Ambulatory Visit: Payer: Self-pay

## 2019-11-17 VITALS — BP 113/76 | HR 55 | Temp 96.6°F | Ht 64.0 in | Wt 143.6 lb

## 2019-11-17 DIAGNOSIS — Z789 Other specified health status: Secondary | ICD-10-CM | POA: Diagnosis not present

## 2019-11-17 DIAGNOSIS — E7849 Other hyperlipidemia: Secondary | ICD-10-CM

## 2019-11-17 DIAGNOSIS — I639 Cerebral infarction, unspecified: Secondary | ICD-10-CM | POA: Diagnosis not present

## 2019-11-17 MED ORDER — REPATHA SURECLICK 140 MG/ML ~~LOC~~ SOAJ: 140.0000 mg | SUBCUTANEOUS | 5 refills | Status: DC

## 2019-11-17 NOTE — Progress Notes (Signed)
[facility] [address] [place]. [place] [address] [phone]       OFFICE FOLLOW-UP NOTE  [name]. Date of Birth:  01-25-1971 Medical Record Number:  [id number]   HPI: Initial visit 05/20/2019: Judy Walter is a 49 year old Caucasian lady seen today for initial office follow-up visit following hospital admission for stroke in May 2020.  History is obtained from the patient, review of electronic medical records and have personally reviewed imaging films in PACS.  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  She described difficulty finding words as well as slurred speech she presented several days after after her troubles began.  MRI scan of the brain showed a large 2 cm left basal ganglia infarct.  MRA of the head showed no large or medium vessel intracranial stenosis in the anterior circulation.  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  2D echo showed normal ejection fraction without cardiac source of embolism.  Transcranial Doppler bubble study was negative for PFO.  Lower extremity venous Dopplers were negative for DVT.  TEE showed no evidence of PFO or cardiac source of embolism.  Patient had loop recorder inserted.  So for paroxysmal A. fib has not yet been found.  Her LDL cholesterol was elevated at 222 mg percent.  She is started on Lipitor 80 mg daily but states she had muscle aches and pain and hence reduce the dose to 40 mg which she is tolerating better.  Hemoglobin A1c was 5.4.  ANA was negative and hypercoagulable panel labs were sent and all of them were negative.  Patient did have a very remote history of pulmonary embolism but has not been on long-term anticoagulation.  She had 1 miscarriage long time back.  She denies history of sleep apnea and states she did have a sleep study in the past which was  negative.  She states her blood pressure control has been good.  She took aspirin and Plavix for 3 weeks and is currently now on aspirin 81 mg alone which is tolerating well without muscle aches and pains.  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  She has some lack of dexterity and trouble typing fast but otherwise is fine.  When she is tired she occasionally struggles with finding some words but otherwise is returned back to baseline.  There is no family history of stroke or heart attacks at a young age.  She does not smoke, drink alcohol or do drugs.  Update 11/17/2019 : She returns for follow-up after last visit 6 months ago.  She states she is doing well from stroke standpoint.  She has not had a recurrent stroke or TIA symptoms.  She had lipid profile checked at the last visit and LDL cholesterol was 136 mg percent on Lipitor 40 mg.  We had switched her to Crestor and she was having muscle aches and pains.  She was also asked to take co-Q10 as well but states that she stopped Crestor after a month since it caused her to have worse muscle aches and pains than she was having on Lipitor.  She is currently not on a statin.  She is willing to consider starting Repatha injections if insurance will approve it.  Patient had a loop recorder interrogated last on 11/10/2019 and so far no paroxysmal A. fib has been  found.  She still has some occasional speech and word finding difficulties but she feels that this is getting better and her friends have commented that she is doing well and she is happy about it.  She is tolerating aspirin without bruising or bleeding.  She has no other complaints.  ROS:   14 system review of systems is positive for very occasional speech difficulty, word finding difficulty, muscle aches, joint aches and all other systems negative  PMH:  Past Medical History:  Diagnosis Date  . Abnormal Pap smear of cervix 06/2015   ASCUS   . Cervical dysplasia 2017   colposcopic biopsy  .  Elevated cholesterol   . Leukemia [facility])    age 36, s/p chemotherapy  . Migraines   . Osteopenia 08/2014  . Personal history of venous thrombosis and embolism   . Stroke ([place]) 02/06/2019   acute left basal ganglia  . Vitamin D deficiency     Social History:  Social History   Socioeconomic History  . Marital status: Married    Spouse name: Not on file  . Number of children: Not on file  . Years of education: Not on file  . Highest education level: Not on file  Occupational History  . 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 Smoking status: Never Smoker  . Smokeless tobacco: Never Used  Substance and Sexual Activity  . Alcohol use: No    Alcohol/week: 0.0 standard drinks    Comment: rare wine  . Drug use: No  . Sexual activity: Yes    Partners: Male    Birth control/protection: I.U.D.    Comment: Mirena inserted 11-10-13   Other Topics Concern  . Not on file  Social History Narrative   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    Social Determinants of Health   Financial Resource Strain:   . Difficulty of Paying Living Expenses: Not on file  Food Insecurity:   . Worried About Charity fundraiser in the Last Year: Not on file  . Ran Out of Food in the Last Year: Not on file  Transportation Needs:   . Lack of Transportation (Medical): Not on file  . Lack of Transportation (Non-Medical): Not on file  Physical Activity:   . Days of Exercise per Week: Not on file  . Minutes of Exercise per Session: Not on file  Stress:   . Feeling of Stress : Not on file  Social Connections:   . Frequency of Communication with Friends and Family: Not on file  . Frequency of Social Gatherings with Friends and Family: Not on file  . Attends Religious Services: Not on file  . Active Member of Clubs or Organizations: Not on file  . Attends Archivist Meetings: Not on file  . Marital Status: Not on file    Intimate Partner Violence:   . Fear of Current or Ex-Partner: Not on file  . Emotionally Abused: Not on file  . Physically Abused: Not on file  . Sexually Abused: Not on file    Medications:   Current Outpatient Medications on File Prior to Visit  Medication Sig Dispense Refill  . aspirin EC 81 MG EC tablet Take 1 tablet (81 mg total) by mouth daily. 90 tablet 0  . b complex vitamins tablet Take 1 tablet by mouth daily.    . Cholecalciferol (VITAMIN D3) 5000 UNITS TABS Take 1  tablet by mouth daily.    Marland Kitchen co-enzyme Q-10 30 MG capsule Take 7 capsules (210 mg total) by mouth daily. 60 capsule 1  . ibuprofen (ADVIL,MOTRIN) 200 MG tablet Take 400 mg by mouth 2 (two) times daily as needed for headache or moderate pain.     Marland Kitchen levonorgestrel (MIRENA) 20 MCG/24HR IUD 1 each by Intrauterine route once.    Marland Kitchen MAGNESIUM PO Take by mouth daily.    . Multiple Vitamins-Minerals (MULTIVITAMIN ADULTS PO) Take by mouth.    Marland Kitchen POTASSIUM PO Take by mouth daily. Powder    . rosuvastatin (CRESTOR) 5 MG tablet Take 1 tablet (5 mg total) by mouth at bedtime. 30 tablet 11  . venlafaxine XR (EFFEXOR-XR) 37.5 MG 24 hr capsule TAKE 1 CAPSULE (37.5 MG TOTAL) BY MOUTH DAILY. 30 capsule 5   No current facility-administered medications on file prior to visit.    Allergies:   Allergies  Allergen Reactions  . Other     Darvocet- hallucinations  . Sulfonamide Derivatives     REACTION: fever    Physical Exam General: well developed, well nourished pleasant middle-age Caucasian lady, seated, in no evident distress Head: head normocephalic and atraumatic.  Neck: supple with no carotid or supraclavicular bruits Cardiovascular: regular rate and rhythm, no murmurs Musculoskeletal: no deformity Skin:  no rash/petichiae Vascular:  Normal pulses all extremities Vitals:   11/17/19 1024  BP: 113/76  Pulse: (!) 55  Temp: (!) 96.6 F (35.9 C)   Neurologic Exam Mental Status: Awake and fully alert. Oriented to place  and time. Recent and remote memory intact. Attention span, concentration and fund of knowledge appropriate. Mood and affect appropriate.  Able to name 13 animals which can walk on 4 legs.  Normal naming, repetition and comprehension.  Speech is fluent. Cranial Nerves: Fundoscopic exam not done. Pupils equal, briskly reactive to light. Extraocular movements full without nystagmus. Visual fields full to confrontation. Hearing intact. Facial sensation intact. Face, tongue, palate moves normally and symmetrically.  Motor: Normal bulk and tone. Normal strength in all tested extremity muscles. Sensory.: intact to touch ,pinprick .position and vibratory sensation.  Coordination: Rapid alternating movements normal in all extremities. Finger-to-nose and heel-to-shin performed accurately bilaterally. Gait and Station: Arises from chair without difficulty. Stance is normal. Gait demonstrates normal stride length and balance . Able to heel, toe and tandem walk without difficulty.  Reflexes: 1+ and symmetric. Toes downgoing.    ASSESSMENT: 49 year old Caucasian lady with large left basal ganglia infarct in May 2020 of cryptogenic etiology.  Vascular risk factors of hyperlipidemia only.     PLAN: I had a long d/w patient about her remote stroke, statin intolerance and myalgias risk for recurrent stroke/TIAs, personally independently reviewed imaging studies and stroke evaluation results and answered questions.Continue aspirin 81 mg daily  for secondary stroke prevention and maintain strict control of hypertension with blood pressure goal below 130/90, diabetes with hemoglobin A1c goal below 6.5% and lipids with LDL cholesterol goal below 70 mg/dL. I also advised the patient to eat a healthy diet with plenty of whole grains, cereals, fruits and vegetables, exercise regularly and maintain ideal body weight .I recommend trying Repatha 140 mg every 14 days for her hyperlipidemia and she has showed shown statin  intolerance to Lipitor and Crestor in the recent few months.  Followup in the future with my nurse practitioner Janett Billow in 6 months or call earlier if necessary. Greater than 50% of time during this 25 minute visit was spent on counseling,explanation of  diagnosis of cryptogenic stroke, planning of further management, discussion with patient and family and coordination of care Antony Contras, MD  [facility] [address] [place], [place] [address]  Phone [phone] Fax [phone] Note: This document was prepared with digital dictation and possible smart phrase technology. Any transcriptional errors that result from this process are unintentional

## 2019-11-17 NOTE — Patient Instructions (Signed)
I had a long d/w patient about her remote stroke, statin intolerance and myalgias risk for recurrent stroke/TIAs, personally independently reviewed imaging studies and stroke evaluation results and answered questions.Continue aspirin 81 mg daily  for secondary stroke prevention and maintain strict control of hypertension with blood pressure goal below 130/90, diabetes with hemoglobin A1c goal below 6.5% and lipids with LDL cholesterol goal below 70 mg/dL. I also advised the patient to eat a healthy diet with plenty of whole grains, cereals, fruits and vegetables, exercise regularly and maintain ideal body weight .I recommend trying Repatha 140 mg every 14 days for her hyperlipidemia and she has showed shown statin intolerance to Lipitor and Crestor in the recent few months.  Followup in the future with my nurse practitioner Janett Billow in 6 months or call earlier if necessary.

## 2019-11-18 ENCOUNTER — Other Ambulatory Visit: Payer: Self-pay

## 2019-11-18 ENCOUNTER — Telehealth: Payer: Self-pay

## 2019-11-18 MED ORDER — REPATHA SURECLICK 140 MG/ML ~~LOC~~ SOAJ: 140.0000 mg | SUBCUTANEOUS | 5 refills | Status: DC

## 2019-11-18 NOTE — Telephone Encounter (Signed)
PA for Repatha done on cover my meds pending.[employer] is reviewing your PA request. You may close this dialog, return to your dashboard, and perform other tasks.  To check for an update later, open this request again from your dashboard. If [employer] has not replied within 24 hours for urgent requests or within 48 hours for standard requests, please contact [employer] at [phone].

## 2019-11-21 ENCOUNTER — Other Ambulatory Visit: Payer: Self-pay | Admitting: Obstetrics and Gynecology

## 2019-11-21 MED FILL — REPATHA SURECLICK 140 MG/ML: 140 | 28 days supply | Qty: 2 | Fill #0

## 2019-11-21 NOTE — Telephone Encounter (Signed)
Medication refill request: effexor-xr 37.5mg  Last AEX:  11-27-2018 Next AEX: 12-08-2019 Last MMG (if hormonal medication request): n/a Refill authorized: please approve if appropriate

## 2019-11-22 MED FILL — VENLAFAXINE HCL ER 37.5 MG: 37.5 | 30 days supply | Qty: 30 | Fill #0

## 2019-11-24 NOTE — Telephone Encounter (Signed)
PA approve for Repatha from 11/20/2019 to 11/28/2020. Contact number is [phone].

## 2019-12-05 ENCOUNTER — Ambulatory Visit: Payer: 59 | Admitting: Obstetrics and Gynecology

## 2019-12-05 ENCOUNTER — Other Ambulatory Visit: Payer: Self-pay

## 2019-12-08 ENCOUNTER — Other Ambulatory Visit (HOSPITAL_COMMUNITY)
Admission: RE | Admit: 2019-12-08 | Discharge: 2019-12-08 | Disposition: A | Discharge status: Home / Self Care | Payer: 59 | Source: Ambulatory Visit | Attending: Obstetrics and Gynecology | Admitting: Obstetrics and Gynecology

## 2019-12-08 ENCOUNTER — Ambulatory Visit (INDEPENDENT_AMBULATORY_CARE_PROVIDER_SITE_OTHER): Payer: 59 | Admitting: Obstetrics and Gynecology

## 2019-12-08 ENCOUNTER — Other Ambulatory Visit: Payer: Self-pay

## 2019-12-08 ENCOUNTER — Other Ambulatory Visit (HOSPITAL_COMMUNITY): Payer: Self-pay | Admitting: Obstetrics and Gynecology

## 2019-12-08 ENCOUNTER — Encounter: Payer: Self-pay | Admitting: Obstetrics and Gynecology

## 2019-12-08 VITALS — BP 112/80 | HR 56 | Temp 97.7°F | Resp 14 | Ht 64.0 in | Wt 140.4 lb

## 2019-12-08 DIAGNOSIS — N912 Amenorrhea, unspecified: Secondary | ICD-10-CM

## 2019-12-08 DIAGNOSIS — N870 Mild cervical dysplasia: Secondary | ICD-10-CM

## 2019-12-08 DIAGNOSIS — R87610 Atypical squamous cells of undetermined significance on cytologic smear of cervix (ASC-US): Secondary | ICD-10-CM | POA: Diagnosis not present

## 2019-12-08 DIAGNOSIS — Z3009 Encounter for other general counseling and advice on contraception: Secondary | ICD-10-CM

## 2019-12-08 DIAGNOSIS — Z23 Encounter for immunization: Secondary | ICD-10-CM | POA: Diagnosis not present

## 2019-12-08 DIAGNOSIS — Z01419 Encounter for gynecological examination (general) (routine) without abnormal findings: Secondary | ICD-10-CM

## 2019-12-08 MED ORDER — VENLAFAXINE HCL ER 37.5 MG PO CP24: 37.5000 mg | ORAL_CAPSULE | Freq: Every day | ORAL | 3 refills | Status: DC

## 2019-12-08 NOTE — Patient Instructions (Signed)

## 2019-12-08 NOTE — Progress Notes (Signed)
[name] here for annual exam.    Has not gotten Covid vaccine yet.  She is waiting for [employer] vaccine.   No menses.  Does have some hot flashes.  Taking Effexor XR and wants to continue on this.   PCP:  Judy Bears, MD    No LMP recorded. (Menstrual status: IUD).           Sexually active: Yes.    The current method of family planning is IUD--Mirena 11/10/13.    Exercising: No.  walks continuously at work Smoker:  no  Health Maintenance: Pap: 11-27-18 ASCUS:Neg HR HPV, 11-02-17 Neg:Neg HR HPV History of abnormal Pap:  Yes, 11-27-18 ASCUS:Neg HR HPV;colpo showed squamous atypia and one Bx suggested LGSIL, 06-30-16 ASCUS:Neg HR HPV;colpo 08-18-16 showing LGSIL, 06-07-15 ASCUS:Neg HR HPV;colpo 07-16-15 showing LGSIL and Neg ECC, 12-08-10 ASCUS:Pos HR HPV;01-11-11 colpo revealed LGSIL and Neg ECC. MMG: 07-11-19 3D/Neg/density B/BiRads1 Colonoscopy: n/a BMD:   n/a  Result  n/a TDaP: more than 10 years--would like today Gardasil:   no HIV: 02-06-19 NR Hep C: Unsure Screening Labs:  PCP.   reports that she has never smoked. She has never used smokeless tobacco. She reports that she does not drink alcohol or use drugs.  Past Medical History:  Diagnosis Date  . Abnormal Pap smear of cervix 06/2015   ASCUS   . Cervical dysplasia 2017   colposcopic biopsy  . Elevated cholesterol   . Leukemia [facility])    age 63, s/p chemotherapy  . Migraines   . Osteopenia 08/2014  . Personal history of venous thrombosis and embolism   . Stroke ([place]) 02/06/2019   acute left basal ganglia  . Vitamin D deficiency     Past Surgical History:  Procedure Laterality Date  . C-sections  2002, 2008  . DILATION AND CURETTAGE OF UTERUS  1997  . wisdom teeth extracted      Current Outpatient Medications  Medication Sig Dispense Refill  . aspirin EC 81 MG EC tablet Take 1 tablet (81 mg total) by mouth daily. 90 tablet 0  . b complex vitamins tablet Take 1 tablet by  mouth daily.    . Cholecalciferol (VITAMIN D3) 5000 UNITS TABS Take 1 tablet by mouth daily.    Marland Kitchen co-enzyme Q-10 30 MG capsule Take 30 mg by mouth daily.    Marland Kitchen levonorgestrel (MIRENA) 20 MCG/24HR IUD 1 each by Intrauterine route once.    Marland Kitchen MAGNESIUM PO Take by mouth daily.    . Multiple Vitamins-Minerals (MULTIVITAMIN ADULTS PO) Take by mouth.    Marland Kitchen POTASSIUM PO Take by mouth daily. Powder    . venlafaxine XR (EFFEXOR-XR) 37.5 MG 24 hr capsule TAKE 1 CAPSULE BY MOUTH ONCE DAILY 30 capsule 0  . Evolocumab (REPATHA SURECLICK) [id number] MG/ML SOAJ Inject 140 mg into the skin every 14 (fourteen) days. (Patient not taking: Reported on 12/08/2019) 2 pen 5  . ibuprofen (ADVIL,MOTRIN) 200 MG tablet Take 400 mg by mouth 2 (two) times daily as needed for headache or moderate pain.      No current facility-administered medications for this visit.    Family History  Problem Relation Age of Onset  . Coronary artery disease Father   . Hypertension Father   . Hemochromatosis Father   . Cancer Father        prostate  . Heart attack Father   . Stroke Mother   . Coronary artery disease Paternal Grandmother   . Diabetes Paternal  Grandmother   . Hypertension Paternal Grandmother   . Breast cancer Maternal Grandmother   . Osteoporosis Maternal Grandmother   . Alzheimer's disease Maternal Grandmother     Review of Systems  All other systems reviewed and are negative.   Exam:   BP 112/80   Pulse (!) 56   Temp 97.7 F (36.5 C) (Temporal)   Resp 14   Ht 5\' 4"  (1.626 m)   Wt 140 lb 6.4 oz (63.7 kg)   BMI 24.10 kg/m     General appearance: alert, cooperative and appears stated age Head: normocephalic, without obvious abnormality, atraumatic Neck: no adenopathy, supple, symmetrical, trachea midline and thyroid normal to inspection and palpation Lungs: clear to auscultation bilaterally Breasts: right - normal appearance, no masses or tenderness, No nipple retraction or dimpling, No nipple discharge or  bleeding, No axillary adenopathy Left - medial breast with small scar and subcutaneous firm mass consistent with her loop recorder per patient.  No tenderness, No nipple retraction or dimpling, No nipple discharge or bleeding, No axillary adenopathy Heart: regular rate and rhythm Abdomen: soft, non-tender; no masses, no organomegaly Extremities: extremities normal, atraumatic, no cyanosis or edema Skin: skin color, texture, turgor normal. No rashes or lesions Lymph nodes: cervical, supraclavicular, and axillary nodes normal. Neurologic: grossly normal  Pelvic: External genitalia:  no lesions              No abnormal inguinal nodes palpated.              Urethra:  normal appearing urethra with no masses, tenderness or lesions              Bartholins and Skenes: normal                 Vagina: normal appearing vagina with normal color and discharge, no lesions              Cervix: no lesions              Pap taken: Yes.    ECC collection after verbal permission obtained.  Sterile prep of cervix with Hibiclens.  ECC taken and sent to pathology.  Bimanual Exam:  Uterus:  normal size, contour, position, consistency, mobility, non-tender              Adnexa: no mass, fullness, tenderness              Rectal exam: Yes.  .  Confirms.              Anus:  normal sphincter tone, no lesions  Chaperone was present for exam.  Assessment:   Well woman visit with normal exam. Mirena IUD. Perimenopausal female. Anxiety/stress. LGSIL.   Hx PE.  Mild pelvic organ prolapse and stress incontinence. Hx cryptogenic stroke.  Loop recorder in left medial breast.   Plan: Mammogram screening discussed. Self breast awareness reviewed. Pap and HR HPV as above.  ECC also done.  Guidelines for Calcium, Vitamin D, regular exercise program including cardiovascular and weight bearing exercise. [name] and E2.  IUD removal.  Will need to check hormones to see if she needs a new Mirena.  TDap. Continue Effexor  XR.  Follow up annually and prn.    After visit summary provided.

## 2019-12-09 LAB — ESTRADIOL: Estradiol: <5 pg/mL

## 2019-12-09 LAB — FOLLICLE STIMULATING HORMONE: FSH: 26.6 m[IU]/mL

## 2019-12-10 LAB — SURGICAL PATHOLOGY

## 2019-12-10 LAB — CYTOLOGY - PAP
Comment: NEGATIVE
Diagnosis: UNDETERMINED — AB
High risk HPV: NEGATIVE

## 2019-12-11 ENCOUNTER — Telehealth: Payer: Self-pay | Admitting: Obstetrics and Gynecology

## 2019-12-11 NOTE — Telephone Encounter (Signed)
Call placed to convey benefits for iud removal. 

## 2019-12-15 ENCOUNTER — Ambulatory Visit (INDEPENDENT_AMBULATORY_CARE_PROVIDER_SITE_OTHER): Payer: 59 | Admitting: *Deleted

## 2019-12-15 DIAGNOSIS — I639 Cerebral infarction, unspecified: Secondary | ICD-10-CM

## 2019-12-15 LAB — CUP PACEART REMOTE DEVICE CHECK
Date Time Interrogation Session: 20210401014149
Implantable Pulse Generator Implant Date: 20200707

## 2019-12-15 NOTE — Progress Notes (Signed)
ILR Remote 

## 2019-12-16 ENCOUNTER — Other Ambulatory Visit: Payer: Self-pay | Admitting: *Deleted

## 2019-12-16 DIAGNOSIS — N912 Amenorrhea, unspecified: Secondary | ICD-10-CM

## 2019-12-16 NOTE — Telephone Encounter (Signed)
-----   Message from Nunzio Cobbs, MD sent at 12/16/2019  6:15 AM EDT ----- Please contact patient in follow up to her results.  Her pap showed atypia and negative HR HPV.  Her ECC showed squamous LGSIL.  By ASCCP guidelines, she needs cervical cancer screening again in 1 year.  Please enter 12 month recall.   Her hormonal testing looks like menopause.  Her estradiol level is low and her FSH is elevated.   Please schedule an appointment with me for IUD removal.  This will need to go to precert.

## 2019-12-16 NOTE — Telephone Encounter (Signed)
AMH requires frozen serum. We can have her come back to have this test.  Called patient. Advised of option for [facility] testing. Patient agreeable and lab appointment scheduled for 12-19-19.

## 2019-12-16 NOTE — Telephone Encounter (Signed)
Both FSH and estradiol levels are in a postmenopausal range, but let's see if the lab can add an anti-Mullerian hormone level as a secondary check on her reproductive status.  I will reach out to our lab technician.  I understand her concern about pregnancy.

## 2019-12-16 NOTE — Telephone Encounter (Signed)
Call to patient regarding lab results. Advised of pap. HPV and ECC results as directed by Dr Quincy Simmonds. Annual and pap already scheduled for 01-12-21. Recall entered.  Reviewed Estradiol and FSH results and advised can schedule IUD removal.  Patient is uncertain about removal and now interested in replacement. Reviewed both lab results and that [facility] was 26 this year and 28 last year.  Advised will check with Dr Quincy Simmonds for recommendation due to patient concern for pregnancy.

## 2019-12-19 ENCOUNTER — Other Ambulatory Visit: Payer: Self-pay

## 2019-12-19 ENCOUNTER — Other Ambulatory Visit: Payer: 59

## 2019-12-22 ENCOUNTER — Other Ambulatory Visit (INDEPENDENT_AMBULATORY_CARE_PROVIDER_SITE_OTHER): Payer: 59

## 2019-12-22 ENCOUNTER — Telehealth: Payer: Self-pay | Admitting: Neurology

## 2019-12-22 ENCOUNTER — Other Ambulatory Visit: Payer: Self-pay

## 2019-12-22 DIAGNOSIS — Z3009 Encounter for other general counseling and advice on contraception: Secondary | ICD-10-CM

## 2019-12-22 DIAGNOSIS — N912 Amenorrhea, unspecified: Secondary | ICD-10-CM

## 2019-12-22 NOTE — Telephone Encounter (Signed)
I called pt and gave her Judy Billow NP listed advice on [employer] vaccine. I stated per [name] NP there is no know contraindication of receive the covid 19 vaccine and having stroke. I recommend pt call her other MD to get approval. Pt verbalized understanding.

## 2019-12-22 NOTE — Telephone Encounter (Signed)
Pt called wanting to know if the provider thinks it is ok for her to receive the Wynetta Emery and [employer]. She would like to know if this will be ok since she has had a stroke. Please advise.

## 2019-12-22 NOTE — Telephone Encounter (Signed)
No known contraindication of receiving Covid vaccination and stroke history.  Recommend obtaining additional information through [facility]

## 2019-12-25 LAB — ANTI MULLERIAN HORMONE: ANTI-MULLERIAN HORMONE (AMH): <0.015 ng/mL

## 2019-12-25 MED FILL — VENLAFAXINE HCL ER 37.5 MG: 37.5 | 90 days supply | Qty: 90 | Fill #0

## 2020-01-02 DIAGNOSIS — Z20822 Contact with and (suspected) exposure to covid-19: Secondary | ICD-10-CM | POA: Diagnosis not present

## 2020-01-15 LAB — CUP PACEART REMOTE DEVICE CHECK
Date Time Interrogation Session: 20210502014458
Implantable Pulse Generator Implant Date: 20200707

## 2020-01-19 ENCOUNTER — Ambulatory Visit (INDEPENDENT_AMBULATORY_CARE_PROVIDER_SITE_OTHER): Payer: 59 | Admitting: *Deleted

## 2020-01-19 DIAGNOSIS — I639 Cerebral infarction, unspecified: Secondary | ICD-10-CM | POA: Diagnosis not present

## 2020-01-19 NOTE — Progress Notes (Signed)
Carelink Summary Report / Loop Recorder 

## 2020-02-23 ENCOUNTER — Ambulatory Visit (INDEPENDENT_AMBULATORY_CARE_PROVIDER_SITE_OTHER): Payer: 59 | Admitting: *Deleted

## 2020-02-23 DIAGNOSIS — I639 Cerebral infarction, unspecified: Secondary | ICD-10-CM | POA: Diagnosis not present

## 2020-02-23 LAB — CUP PACEART REMOTE DEVICE CHECK
Date Time Interrogation Session: 20210614001020
Implantable Pulse Generator Implant Date: 20200707

## 2020-02-23 NOTE — Progress Notes (Signed)
Carelink Summary Report / Loop Recorder 

## 2020-03-03 ENCOUNTER — Telehealth: Payer: Self-pay

## 2020-03-03 ENCOUNTER — Telehealth: Payer: Self-pay | Admitting: Obstetrics and Gynecology

## 2020-03-03 DIAGNOSIS — Z30432 Encounter for removal of intrauterine contraceptive device: Secondary | ICD-10-CM

## 2020-03-03 NOTE — Telephone Encounter (Signed)
Mirena IUD placed 01/2014 AEX 12/08/19 AMH <0.015 FSH 26.6 Estradiol <5.0  Spoke with Judy Walter. Judy Walter calling to have IUD removal and possible a new one inserted. Judy Walter given recommendations per Dr Talbert Nan and Dr Quincy Simmonds on recent labs from [facility] and [facility] from March and April 2021. Labs showing menopause. Judy Walter agreeable to just have IUD removed at this time. Judy Walter to discuss more about menopause and what to expect at [place]. Judy Walter scheduled for IUD removal OV on 03/10/20 at 10 am with Dr Quincy Simmonds. Judy Walter verbalized understanding. Judy Walter states has not bled in 2-3 years while having IUD in place.   Routing to Dr Quincy Simmonds for review.  Encounter closed.  Orders placed.  Cc: Alfonse Spruce for precert.    Per Dr Quincy Simmonds: 12/16/19 Her hormonal testing looks like menopause.  Her estradiol level is low and her FSH is elevated.   Please schedule an appointment with me for IUD removal.  This will need to go to precert.   Per Dr Talbert Nan 12/22/19:  Salvadore Dom, MD  12/25/2019 3:25 PM EDT     Please let the patient know that her [place], shows that her ovarian reserve is very low. Her [place] is just in a menopausal range and her estradiol was low. I suspect given her [place] that she may still be perimenopausal (that # can go up and down), but her chance of pregnancy is very very low. It would be reasonable to pull her IUD and see if she has any resumption in cycles.

## 2020-03-03 NOTE — Telephone Encounter (Signed)
Call placed to convey benefits. Spoke with the patient and conveyed the benefits. Patient understands/agreeable with the benefits. Patient is aware of the cancellation policy. Appointment scheduled 03/10/20.

## 2020-03-03 NOTE — Telephone Encounter (Signed)
Patient is calling to discuss IUD removal and replacement.

## 2020-03-09 ENCOUNTER — Telehealth: Payer: Self-pay

## 2020-03-09 NOTE — Telephone Encounter (Signed)
Patient canceled appointment for IUD removal for (03/10/20). Patient would like to speak with nurse regarding questions about IUD and rescheduling appointment.

## 2020-03-09 NOTE — Telephone Encounter (Signed)
Spoke with pt. Pt cancelled IUD removal due to busy week and wanting to have IUD exchange vs removal. Pt  given recommendations per Dr Talbert Nan and Dr Quincy Simmonds on recent labs from [facility] and [facility] from March and April 2021. Labs showing menopause.  Pt concerned with having IUD removal only due to past hx of blood clots and wanting still to prevent pregnancy. Pt states husband is not a candidate for office vasectomy due to body size and would have to be in OR under anesthesia .  Pt requesting IUD exchange and wanting to know the thoughts from Dr Quincy Simmonds.  Advised OV or mychart visit to discuss. Pt declines at this time due to going on vacation next week. Offered several appts from July 19th and pt declines.   Pt states has not bled in 2-3 years while having IUD in place.   Advised will review with Dr Quincy Simmonds and return call with recommendations and advice.  Pt agreeable.   Routing to Dr Quincy Simmonds.

## 2020-03-10 ENCOUNTER — Ambulatory Visit: Payer: Self-pay | Admitting: Obstetrics and Gynecology

## 2020-03-10 NOTE — Telephone Encounter (Signed)
I understand her concern about pregnancy prevention due to her medical history.   Based on patient's hormone status indicating menopause, I do not recommend placing a new Mirena IUD.    I would instead recommend she keep the IUD in place for an extended period of time.  Up to Date indicates pregnancy rate is less than 1% for seven years of use.  She will need the IUD removed at the 7 year mark.   I hope this is satisfactory to her.

## 2020-03-10 NOTE — Telephone Encounter (Signed)
Left message to call Nakshatra Klose, RN at [facility] [phone].   

## 2020-03-10 NOTE — Telephone Encounter (Signed)
Spoke with patient, advised per Dr. Quincy Simmonds. Patient request OV to further discuss with Dr. Quincy Simmonds. OV scheduled for 7/19 at 11am. Patient verbalizes understanding and is agreeable.   Routing to provider for final review. Patient is agreeable to disposition. Will close encounter.

## 2020-03-19 ENCOUNTER — Telehealth: Payer: Self-pay

## 2020-03-19 NOTE — Telephone Encounter (Signed)
Spoke with pt. Pt wanting to possibly changing OV to Mychart video visit due to pt being busy that week. Pt is scheduled for IUD consult with Dr Quincy Simmonds on 03/29/20 at 1100.  Advised pt will review with Dr Quincy Simmonds and return call with update on visit. Pt agreeable.   Routing to Dr Quincy Simmonds

## 2020-03-19 NOTE — Telephone Encounter (Signed)
Patient called wanting to see if her IUD consult appointment could be changed to a mychart visit instead.

## 2020-03-20 NOTE — Telephone Encounter (Signed)
Ok for My Chart video visit.

## 2020-03-22 NOTE — Telephone Encounter (Signed)
Spoke with pt. Pt updated on Mychart video visit option. Pt agreeable. Pt appt changed to video visit on 7/19 at 1100 am. Pt verbalized understanding.   Routing to Dr Quincy Simmonds for review.  Encounter closed.

## 2020-03-24 ENCOUNTER — Telehealth: Payer: Self-pay | Admitting: Internal Medicine

## 2020-03-24 DIAGNOSIS — R5383 Other fatigue: Secondary | ICD-10-CM | POA: Diagnosis not present

## 2020-03-24 DIAGNOSIS — Z0000 Encounter for general adult medical examination without abnormal findings: Secondary | ICD-10-CM | POA: Diagnosis not present

## 2020-03-24 DIAGNOSIS — Z79899 Other long term (current) drug therapy: Secondary | ICD-10-CM | POA: Diagnosis not present

## 2020-03-24 DIAGNOSIS — Z1331 Encounter for screening for depression: Secondary | ICD-10-CM | POA: Diagnosis not present

## 2020-03-24 DIAGNOSIS — Z6824 Body mass index (BMI) 24.0-24.9, adult: Secondary | ICD-10-CM | POA: Diagnosis not present

## 2020-03-24 DIAGNOSIS — Z299 Encounter for prophylactic measures, unspecified: Secondary | ICD-10-CM | POA: Diagnosis not present

## 2020-03-24 MED FILL — VENLAFAXINE HCL ER 37.5 MG: 37.5 | 90 days supply | Qty: 90 | Fill #1

## 2020-03-24 NOTE — Telephone Encounter (Signed)
    Pt would like to speak with Dr. Tanna Furry nurse, she is wondering why she did not receive a recall notification to see Dr. Lovena Le, she wanted to know if she needs to see him or not

## 2020-03-24 NOTE — Progress Notes (Signed)
GYNECOLOGY  VISIT   HPI: 49 y.o.   Married  Caucasian  female   [phone] with No LMP recorded. (Menstrual status: IUD).   here for MyChart video consult regarding Mirena IUD.   She has confirmed her identity with 2 identifiers.  She gives permission for the visit.  She is at home.  I am in my office at work.   She is asking about a new Mirena IUD.  Recent labs showed AMH < 0.015, FSH 26.6, estradiol < 5. Her [place] and estradiol were similar in 2019.   She is having an increase in her hot flashes in the last few months.  She is on Effexor XL 37.5 mg daily.  She is hot and cold.   GYNECOLOGIC HISTORY: No LMP recorded. (Menstrual status: IUD). Contraception: Mirena IUD 11-10-13 Menopausal hormone therapy: none Last mammogram: 07-11-19 3D/Neg/density B/BiRads1 Last pap smear: 12-08-19 ASCUS:Neg HR HPV--ECC LGSIL, 11-27-18 ASCUS:Neg HR HPV, 11-02-17 Neg:Neg HR HPV         OB History    Gravida  4   Para  3   Term      Preterm      AB  1   Living  3     SAB  1   TAB      Ectopic      Multiple      Live Births                 Patient Active Problem List   Diagnosis Date Noted  . Cryptogenic stroke ([place]) 03/18/2019  . Palpitations 03/11/2019  . Infarction of left basal ganglia ([place]) 02/06/2019  . HYPERLIPIDEMIA TYPE I / IV 04/14/2009  . 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 Abnormal Pap smear of cervix 06/2015   ASCUS   . Cervical dysplasia 2017   colposcopic biopsy  . Elevated cholesterol   . Leukemia [facility])    age 45, s/p chemotherapy  . Migraines   . Osteopenia 08/2014  . Personal history of venous thrombosis and embolism   . Stroke ([place]) 02/06/2019   acute left basal ganglia  . Vitamin D deficiency     Past Surgical History:  Procedure Laterality Date  . C-sections  2002, 2008  . DILATION AND CURETTAGE OF UTERUS  1997  . wisdom teeth extracted      Current Outpatient Medications  Medication Sig  Dispense Refill  . aspirin EC 81 MG EC tablet Take 1 tablet (81 mg total) by mouth daily. 90 tablet 0  . b complex vitamins tablet Take 1 tablet by mouth daily.    . Cholecalciferol (VITAMIN D3) 5000 UNITS TABS Take 1 tablet by mouth daily.    Marland Kitchen co-enzyme Q-10 30 MG capsule Take 30 mg by mouth daily.    . Evolocumab (REPATHA SURECLICK) 588 MG/ML SOAJ Inject 140 mg into the skin every 14 (fourteen) days. (Patient not taking: Reported on 12/08/2019) 2 pen 5  . ibuprofen (ADVIL,MOTRIN) 200 MG tablet Take 400 mg by mouth 2 (two) times daily as needed for headache or moderate pain.     Marland Kitchen levonorgestrel (MIRENA) 20 MCG/24HR IUD 1 each by Intrauterine route once.    Marland Kitchen MAGNESIUM PO Take by mouth daily.    . Multiple Vitamins-Minerals (MULTIVITAMIN ADULTS PO) Take by mouth.    Marland Kitchen POTASSIUM PO Take by mouth daily. Powder    . venlafaxine XR (EFFEXOR-XR) 37.5 MG 24 hr capsule Take 1 capsule (  37.5 mg total) by mouth daily. 90 capsule 3   No current facility-administered medications for this visit.     ALLERGIES: Crestor [rosuvastatin], Lipitor [atorvastatin], Other, and Sulfonamide derivatives  Family History  Problem Relation Age of Onset  . Coronary artery disease Father   . Hypertension Father   . Hemochromatosis Father   . Cancer Father        prostate  . Heart attack Father   . Stroke Mother   . Coronary artery disease Paternal Grandmother   . Diabetes Paternal Grandmother   . Hypertension Paternal Grandmother   . Breast cancer Maternal Grandmother   . Osteoporosis Maternal Grandmother   . Alzheimer's disease Maternal Grandmother     Social History   Socioeconomic History  . Marital status: Married    Spouse name: Not on file  . Number of children: Not on file  . Years of education: Not on file  . Highest education level: Not on file  Occupational History  . 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 Smoking  status: Never Smoker  . Smokeless tobacco: Never Used  Vaping Use  . Vaping Use: Never used  Substance and Sexual Activity  . Alcohol use: No    Alcohol/week: 0.0 standard drinks    Comment: rare wine  . Drug use: No  . Sexual activity: Yes    Partners: Male    Birth control/protection: I.U.D.    Comment: Mirena inserted 11-10-13   Other Topics Concern  . Not on file  Social History Narrative   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    Social Determinants of Health   Financial Resource Strain:   . Difficulty of Paying Living Expenses:   Food Insecurity:   . Worried About Charity fundraiser in the Last Year:   . Arboriculturist in the Last Year:   Transportation Needs:   . Film/video editor (Medical):   Marland Kitchen Lack of Transportation (Non-Medical):   Physical Activity:   . Days of Exercise per Week:   . Minutes of Exercise per Session:   Stress:   . Feeling of Stress :   Social Connections:   . Frequency of Communication with Friends and Family:   . Frequency of Social Gatherings with Friends and Family:   . Attends Religious Services:   . Active Member of Clubs or Organizations:   . Attends Archivist Meetings:   Marland Kitchen Marital Status:   Intimate Partner Violence:   . Fear of Current or Ex-Partner:   . Emotionally Abused:   Marland Kitchen Physically Abused:   . Sexually Abused:     Review of Systems   See HPI.  PHYSICAL EXAMINATION:    There were no vitals taken for this visit.    General appearance: alert, cooperative and appears stated age   ASSESSMENT  Menopausal symptoms.  Low chance for fertility by labs.  Mirena IUD.  In 6th year of use.   PLAN  We discussed doing a 7th year of use of Mirena after reviewing a literature search in Up to Date on extended use.  She understands that a new Mirena is also an option.  She opts for continued use of her current Mirena.  Will check [place] and estradiol prior to removing it by November 13, 2020.  Will increase  Effexor to 75 mg daily.  She has enough at home to take  2 capsules of 37.5 mg daily.  She will follow up in 6 weeks.   _20_____ minutes consultation.

## 2020-03-25 NOTE — Telephone Encounter (Signed)
Pt scheduled for yearly f/u

## 2020-03-29 ENCOUNTER — Other Ambulatory Visit: Payer: Self-pay

## 2020-03-29 ENCOUNTER — Ambulatory Visit (INDEPENDENT_AMBULATORY_CARE_PROVIDER_SITE_OTHER): Payer: 59 | Admitting: *Deleted

## 2020-03-29 ENCOUNTER — Telehealth (INDEPENDENT_AMBULATORY_CARE_PROVIDER_SITE_OTHER): Payer: 59 | Admitting: Obstetrics and Gynecology

## 2020-03-29 ENCOUNTER — Encounter: Payer: Self-pay | Admitting: Obstetrics and Gynecology

## 2020-03-29 VITALS — Ht 64.0 in

## 2020-03-29 DIAGNOSIS — N951 Menopausal and female climacteric states: Secondary | ICD-10-CM | POA: Diagnosis not present

## 2020-03-29 DIAGNOSIS — Z3009 Encounter for other general counseling and advice on contraception: Secondary | ICD-10-CM

## 2020-03-29 DIAGNOSIS — I639 Cerebral infarction, unspecified: Secondary | ICD-10-CM | POA: Diagnosis not present

## 2020-03-29 LAB — CUP PACEART REMOTE DEVICE CHECK
Date Time Interrogation Session: 20210718231754
Implantable Pulse Generator Implant Date: 20200707

## 2020-03-29 MED ORDER — VENLAFAXINE HCL ER 75 MG PO CP24: 75.0000 mg | ORAL_CAPSULE | Freq: Every day | ORAL | 0 refills | Status: DC

## 2020-03-30 NOTE — Progress Notes (Signed)
Carelink Summary Report / Loop Recorder 

## 2020-04-07 DIAGNOSIS — I639 Cerebral infarction, unspecified: Secondary | ICD-10-CM | POA: Diagnosis not present

## 2020-04-07 DIAGNOSIS — H9319 Tinnitus, unspecified ear: Secondary | ICD-10-CM | POA: Diagnosis not present

## 2020-04-07 DIAGNOSIS — Z299 Encounter for prophylactic measures, unspecified: Secondary | ICD-10-CM | POA: Diagnosis not present

## 2020-04-27 ENCOUNTER — Ambulatory Visit: Payer: 59 | Admitting: Internal Medicine

## 2020-04-27 ENCOUNTER — Other Ambulatory Visit: Payer: Self-pay

## 2020-04-27 ENCOUNTER — Encounter: Payer: Self-pay | Admitting: Internal Medicine

## 2020-04-27 VITALS — BP 112/64 | HR 55 | Ht 64.0 in | Wt 141.0 lb

## 2020-04-27 DIAGNOSIS — R002 Palpitations: Secondary | ICD-10-CM

## 2020-04-27 DIAGNOSIS — I639 Cerebral infarction, unspecified: Secondary | ICD-10-CM | POA: Diagnosis not present

## 2020-04-27 NOTE — Progress Notes (Signed)
HPI Judy Walter returns today for followup of a cryptogenic stroke. She underwent ILR insertion. She has done well in the interim with no neuro symptoms, chest pain or sob. No problems with the ILR.  Allergies  Allergen Reactions  . Crestor [Rosuvastatin]     Muscle aches and pains  . Lipitor [Atorvastatin]     Muscle pain  . Other     Darvocet- hallucinations  . Sulfonamide Derivatives     REACTION: fever     Current Outpatient Medications  Medication Sig Dispense Refill  . aspirin EC 81 MG EC tablet Take 1 tablet (81 mg total) by mouth daily. 90 tablet 0  . Cholecalciferol (VITAMIN D3) 5000 UNITS TABS Take 1 tablet by mouth daily.    Marland Kitchen levonorgestrel (MIRENA) 20 MCG/24HR IUD 1 each by Intrauterine route once.    Marland Kitchen MAGNESIUM PO Take by mouth daily.    . Multiple Vitamins-Minerals (MULTIVITAMIN ADULTS PO) Take by mouth.    Marland Kitchen POTASSIUM PO Take by mouth daily. Powder    . venlafaxine XR (EFFEXOR-XR) 75 MG 24 hr capsule Take 1 capsule (75 mg total) by mouth daily. 30 capsule 0   No current facility-administered medications for this visit.     Past Medical History:  Diagnosis Date  . Abnormal Pap smear of cervix 06/2015   ASCUS   . Cervical dysplasia 2017   colposcopic biopsy  . Elevated cholesterol   . Leukemia [facility])    age 22, s/p chemotherapy  . Migraines   . Osteopenia 08/2014  . Personal history of venous thrombosis and embolism   . Stroke ([place]) 02/06/2019   acute left basal ganglia  . Vitamin D deficiency     ROS:   All systems reviewed and negative except as noted in the HPI.   Past Surgical History:  Procedure Laterality Date  . C-sections  2002, 2008  . DILATION AND CURETTAGE OF UTERUS  1997  . wisdom teeth extracted       Family History  Problem Relation Age of Onset  . Coronary artery disease Father   . Hypertension Father   . Hemochromatosis Father   . Cancer Father        prostate  . Heart attack Father   . Stroke Mother   .  Coronary artery disease Paternal Grandmother   . Diabetes Paternal Grandmother   . Hypertension Paternal Grandmother   . Breast cancer Maternal Grandmother   . Osteoporosis Maternal Grandmother   . Alzheimer's disease Maternal Grandmother      Social History   Socioeconomic History  . Marital status: Married    Spouse name: Not on file  . Number of children: Not on file  . Years of education: Not on file  . Highest education level: Not on file  Occupational History  . 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 Smoking status: Never Smoker  . Smokeless tobacco: Never Used  Vaping Use  . Vaping Use: Never used  Substance and Sexual Activity  . Alcohol use: No    Alcohol/week: 0.0 standard drinks    Comment: rare wine  . Drug use: No  . Sexual activity: Yes    Partners: Male    Birth control/protection: I.U.D.    Comment: Mirena inserted 11-10-13   Other Topics Concern  . Not on file  Social History Narrative   Full time...RN.Marland Kitchen Regularly  exercises- walks.    Social Determinants of Health   Financial Resource Strain:   . Difficulty of Paying Living Expenses:   Food Insecurity:   . Worried About Charity fundraiser in the Last Year:   . Arboriculturist in the Last Year:   Transportation Needs:   . Film/video editor (Medical):   Marland Kitchen Lack of Transportation (Non-Medical):   Physical Activity:   . Days of Exercise per Week:   . Minutes of Exercise per Session:   Stress:   . Feeling of Stress :   Social Connections:   . Frequency of Communication with Friends and Family:   . Frequency of Social Gatherings with Friends and Family:   . Attends Religious Services:   . Active Member of Clubs or Organizations:   . Attends Archivist Meetings:   Marland Kitchen Marital Status:   Intimate Partner Violence:   . Fear of Current or Ex-Partner:   . Emotionally Abused:   Marland Kitchen Physically Abused:   . Sexually  Abused:      BP 112/64   Pulse (!) 55   Ht 5\' 4"  (1.626 m)   Wt 141 lb (64 kg)   SpO2 98%   BMI 24.20 kg/m   Physical Exam:  Well appearing NAD HEENT: Unremarkable Neck:  No JVD, no thyromegally Lymphatics:  No adenopathy Back:  No CVA tenderness Lungs:  Clear with no wheezes HEART:  Regular rate rhythm, no murmurs, no rubs, no clicks Abd:  soft, positive bowel sounds, no organomegally, no rebound, no guarding Ext:  2 plus pulses, no edema, no cyanosis, no clubbing Skin:  No rashes no nodules Neuro:  CN II through XII intact, motor grossly intact  ECG - NSR  DEVICE  Normal device function.  See PaceArt for details.   Assess/Plan: 1. Cryptogenic stroke - no recurrent symptoms. She will undergo watchful waiting. 2. ILR - her device is working normally but demonstrates no arrhythmias 3. 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 We will follow.  Carleene Overlie Cypress Hinkson,MD

## 2020-04-27 NOTE — Patient Instructions (Signed)

## 2020-05-03 ENCOUNTER — Ambulatory Visit (INDEPENDENT_AMBULATORY_CARE_PROVIDER_SITE_OTHER): Payer: 59 | Admitting: *Deleted

## 2020-05-03 DIAGNOSIS — I6389 Other cerebral infarction: Secondary | ICD-10-CM

## 2020-05-03 LAB — CUP PACEART REMOTE DEVICE CHECK
Date Time Interrogation Session: 20210820231831
Implantable Pulse Generator Implant Date: 20200707

## 2020-05-06 NOTE — Progress Notes (Signed)
Carelink Summary Report / Loop Recorder 

## 2020-05-12 ENCOUNTER — Encounter: Payer: Self-pay | Admitting: Obstetrics and Gynecology

## 2020-05-12 ENCOUNTER — Telehealth (INDEPENDENT_AMBULATORY_CARE_PROVIDER_SITE_OTHER): Payer: 59 | Admitting: Obstetrics and Gynecology

## 2020-05-12 VITALS — Ht 64.0 in

## 2020-05-12 DIAGNOSIS — N951 Menopausal and female climacteric states: Secondary | ICD-10-CM

## 2020-05-12 DIAGNOSIS — Z5181 Encounter for therapeutic drug level monitoring: Secondary | ICD-10-CM

## 2020-05-12 MED ORDER — VENLAFAXINE HCL ER 37.5 MG PO CP24: 37.5000 mg | ORAL_CAPSULE | Freq: Every day | ORAL | 1 refills | Status: DC

## 2020-05-12 NOTE — Progress Notes (Signed)
GYNECOLOGY  VISIT   HPI: 49 y.o.   Married  Caucasian  female   [phone] with No LMP recorded. (Menstrual status: IUD).   here for 6 week medication follow up via MyChart Video.   Patient confirms her identity.  She gives permission for the visit.   She is at her parent's house.  I am in my office.  Started at 4:28.  Ended at 4:39.  Patient is using SNRI for menopausal symptoms.  She is not an estrogen candidate. She took Effexor 37.5 mg, 2 pills daily for one week.  She develops somnolence and lack of energy.  Returned to Effexor 37.5 mg daily, and she is back to her baseline.   Her hot flashes are tolerable. Overall, she feels better with her hot flashes.   GYNECOLOGIC HISTORY: No LMP recorded. (Menstrual status: IUD). Contraception:  Mirena IUD 11-10-13 Menopausal hormone therapy:  none Last mammogram: 07-11-19 3D/Neg/density B/BiRads1 Last pap smear: 12-08-19 ASCUS:Neg HR HPV--ECC LGSIL, 11-27-18 ASCUS:Neg HR HPV, 11-02-17 Neg:Neg HR HPV         OB History    Gravida  4   Para  3   Term      Preterm      AB  1   Living  3     SAB  1   TAB      Ectopic      Multiple      Live Births                 Patient Active Problem List   Diagnosis Date Noted  . Cryptogenic stroke ([place]) 03/18/2019  . Palpitations 03/11/2019  . Infarction of left basal ganglia ([place]) 02/06/2019  . HYPERLIPIDEMIA TYPE I / IV 04/14/2009  . 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 Abnormal Pap smear of cervix 06/2015   ASCUS   . Cervical dysplasia 2017   colposcopic biopsy  . Elevated cholesterol   . Leukemia [facility])    age 19, s/p chemotherapy  . Migraines   . Osteopenia 08/2014  . Personal history of venous thrombosis and embolism   . Stroke ([place]) 02/06/2019   acute left basal ganglia  . Vitamin D deficiency     Past Surgical History:  Procedure Laterality Date  . C-sections  2002, 2008  . DILATION AND CURETTAGE OF UTERUS  1997   . wisdom teeth extracted      Current Outpatient Medications  Medication Sig Dispense Refill  . aspirin EC 81 MG EC tablet Take 1 tablet (81 mg total) by mouth daily. 90 tablet 0  . Cholecalciferol (VITAMIN D3) 5000 UNITS TABS Take 1 tablet by mouth daily.    Marland Kitchen levonorgestrel (MIRENA) 20 MCG/24HR IUD 1 each by Intrauterine route once.    Marland Kitchen MAGNESIUM PO Take by mouth daily.    . Multiple Vitamins-Minerals (MULTIVITAMIN ADULTS PO) Take by mouth.    Marland Kitchen POTASSIUM PO Take by mouth daily. Powder    . venlafaxine XR (EFFEXOR-XR) 75 MG 24 hr capsule Take 1 capsule (75 mg total) by mouth daily. 30 capsule 0   No current facility-administered medications for this visit.     ALLERGIES: Crestor [rosuvastatin], Lipitor [atorvastatin], Other, and Sulfonamide derivatives  Family History  Problem Relation Age of Onset  . Coronary artery disease Father   . Hypertension Father   . Hemochromatosis Father   . Cancer Father        prostate  .  Heart attack Father   . Stroke Mother   . Coronary artery disease Paternal Grandmother   . Diabetes Paternal Grandmother   . Hypertension Paternal Grandmother   . Breast cancer Maternal Grandmother   . Osteoporosis Maternal Grandmother   . Alzheimer's disease Maternal Grandmother     Social History   Socioeconomic History  . Marital status: Married    Spouse name: Not on file  . Number of children: Not on file  . Years of education: Not on file  . Highest education level: Not on file  Occupational History  . 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 Smoking status: Never Smoker  . Smokeless tobacco: Never Used  Vaping Use  . Vaping Use: Never used  Substance and Sexual Activity  . Alcohol use: No    Alcohol/week: 0.0 standard drinks    Comment: rare wine  . Drug use: No  . Sexual activity: Yes    Partners: Male    Birth control/protection: I.U.D.    Comment: Mirena  inserted 11-10-13   Other Topics Concern  . Not on file  Social History Narrative   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    Social Determinants of Health   Financial Resource Strain:   . Difficulty of Paying Living Expenses: Not on file  Food Insecurity:   . Worried About Charity fundraiser in the Last Year: Not on file  . Ran Out of Food in the Last Year: Not on file  Transportation Needs:   . Lack of Transportation (Medical): Not on file  . Lack of Transportation (Non-Medical): Not on file  Physical Activity:   . Days of Exercise per Week: Not on file  . Minutes of Exercise per Session: Not on file  Stress:   . Feeling of Stress : Not on file  Social Connections:   . Frequency of Communication with Friends and Family: Not on file  . Frequency of Social Gatherings with Friends and Family: Not on file  . Attends Religious Services: Not on file  . Active Member of Clubs or Organizations: Not on file  . Attends Archivist Meetings: Not on file  . Marital Status: Not on file  Intimate Partner Violence:   . Fear of Current or Ex-Partner: Not on file  . Emotionally Abused: Not on file  . Physically Abused: Not on file  . Sexually Abused: Not on file    Review of Systems  All other systems reviewed and are negative.   PHYSICAL EXAMINATION:    Ht 5\' 4"  (1.626 m)   BMI 24.20 kg/m     General appearance: alert, cooperative and appears stated age  ASSESSMENT  Menopausal symptoms, manageable with Effexor 37.5 mg.  Mirena IUD.  Hx PE and stroke.  PLAN  She will continue Effexor 37.5 mg dosage. We discussed the Mirena IUD has been approved for 7 years of use.  Fu for annual exam and prn.

## 2020-05-18 ENCOUNTER — Ambulatory Visit: Payer: 59 | Admitting: Adult Health

## 2020-05-19 ENCOUNTER — Ambulatory Visit: Payer: 59 | Admitting: Adult Health

## 2020-05-19 ENCOUNTER — Encounter: Payer: Self-pay | Admitting: Adult Health

## 2020-05-19 VITALS — BP 118/79 | HR 57 | Ht 64.0 in | Wt 143.2 lb

## 2020-05-19 DIAGNOSIS — Z789 Other specified health status: Secondary | ICD-10-CM | POA: Diagnosis not present

## 2020-05-19 DIAGNOSIS — I639 Cerebral infarction, unspecified: Secondary | ICD-10-CM

## 2020-05-19 DIAGNOSIS — E7849 Other hyperlipidemia: Secondary | ICD-10-CM

## 2020-05-19 NOTE — Progress Notes (Signed)
[facility] [address] [place]. [place] [address] [phone]       OFFICE FOLLOW-UP NOTE  [name]. Date of Birth:  02/17/1971 Medical Record Number:  [id number]   Chief Complaint  Patient presents with  . Follow-up    6 month f/u. States she has been well since last visit.   . corner room    alone      HPI:   Today, 05/19/2020, Judy Walter returns for stroke follow-up  Reports residual deficits of occasional word finding difficulty but overall greatly improved - typically worse with fatigue Denies new or worsening stroke/TIA symptoms  Remains on aspirin 81 mg daily with mild bruising but no bleeding Blood pressure today 118/79 Loop recorder has not shown atrial fibrillation thus far  No concerns at this time    History provided for reference purposes only Update 11/17/2019 Dr. Leonie Man : She returns for follow-up after last visit 6 months ago.  She states she is doing well from stroke standpoint.  She has not had a recurrent stroke or TIA symptoms.  She had lipid profile checked at the last visit and LDL cholesterol was 136 mg percent on Lipitor 40 mg.  We had switched her to Crestor and she was having muscle aches and pains.  She was also asked to take co-Q10 as well but states that she stopped Crestor after a month since it caused her to have worse muscle aches and pains than she was having on Lipitor.  She is currently not on a statin.  She is willing to consider starting Repatha injections if insurance will approve it.  Patient had a loop recorder interrogated last on 11/10/2019 and so far no paroxysmal A. fib has been found.  She still has some occasional speech and word finding difficulties but she feels that this is getting better and her friends have commented that she is doing well and she is happy about it.  She is tolerating aspirin without bruising or bleeding.  She has no other complaints.  Initial visit 05/20/2019 Dr. Leonie Man: Judy Walter  is a 49 year old Caucasian lady seen today for initial office follow-up visit following hospital admission for stroke in May 2020.  History is obtained from the patient, review of electronic medical records and have personally reviewed imaging films in PACS.  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  She described difficulty finding words as well as slurred speech she presented several days after after her troubles began.  MRI scan of the brain showed a large 2 cm left basal ganglia infarct.  MRA of the head showed no large or medium vessel intracranial stenosis in the anterior circulation.  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  2D echo showed normal ejection fraction without cardiac source of embolism.  Transcranial Doppler bubble study was negative for PFO.  Lower extremity venous Dopplers were negative for DVT.  TEE showed no evidence of PFO or cardiac source of embolism.  Patient had loop recorder inserted.  So for paroxysmal A. fib has not yet been found.  Her LDL cholesterol was elevated at 222 mg percent.  She is started on Lipitor 80 mg daily but states she had muscle aches and pain and hence reduce the dose to 40 mg which she is tolerating better.  Hemoglobin A1c was 5.4.  ANA  was negative and hypercoagulable panel labs were sent and all of them were negative.  Patient did have a very remote history of pulmonary embolism but has not been on long-term anticoagulation.  She had 1 miscarriage long time back.  She denies history of sleep apnea and states she did have a sleep study in the past which was negative.  She states her blood pressure control has been good.  She took aspirin and Plavix for 3 weeks and is currently now on aspirin 81 mg alone which is tolerating well without muscle aches and pains.  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  She  has some lack of dexterity and trouble typing fast but otherwise is fine.  When she is tired she occasionally struggles with finding some words but otherwise is returned back to baseline.  There is no family history of stroke or heart attacks at a young age.  She does not smoke, drink alcohol or do drugs.    ROS:   14 system review of systems is positive for those listed in HPI and all other systems negative  PMH:  Past Medical History:  Diagnosis Date  . Abnormal Pap smear of cervix 06/2015   ASCUS   . Cervical dysplasia 2017   colposcopic biopsy  . Elevated cholesterol   . Leukemia [facility])    age 63, s/p chemotherapy  . Migraines   . Osteopenia 08/2014  . Personal history of venous thrombosis and embolism   . Stroke ([place]) 02/06/2019   acute left basal ganglia  . Vitamin D deficiency     Social History:  Social History   Socioeconomic History  . Marital status: Married    Spouse name: Not on file  . Number of children: Not on file  . Years of education: Not on file  . Highest education level: Not on file  Occupational History  . 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 Smoking status: Never Smoker  . Smokeless tobacco: Never Used  Vaping Use  . Vaping Use: Never used  Substance and Sexual Activity  . Alcohol use: No    Alcohol/week: 0.0 standard drinks    Comment: rare wine  . Drug use: No  . Sexual activity: Yes    Partners: Male    Birth control/protection: I.U.D.    Comment: Mirena inserted 11-10-13   Other Topics Concern  . Not on file  Social History Narrative   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    Social Determinants of Health   Financial Resource Strain:   . Difficulty of Paying Living Expenses: Not on file  Food Insecurity:   . Worried About Charity fundraiser in the Last Year: Not on file  . Ran Out of Food in the Last Year: Not on file  Transportation Needs:   .  Lack of Transportation (Medical): Not on file  . Lack of Transportation (Non-Medical): Not on file  Physical Activity:   . Days of Exercise per Week: Not on file  . Minutes of Exercise per Session: Not on file  Stress:   . Feeling of Stress : Not on file  Social Connections:   . Frequency of Communication with Friends and Family: Not on file  . Frequency of Social Gatherings with Friends and Family: Not on file  . Attends Religious Services: Not on file  . Active Member of Clubs or Organizations:  Not on file  . Attends Archivist Meetings: Not on file  . Marital Status: Not on file  Intimate Partner Violence:   . Fear of Current or Ex-Partner: Not on file  . Emotionally Abused: Not on file  . Physically Abused: Not on file  . Sexually Abused: Not on file    Medications:   Current Outpatient Medications on File Prior to Visit  Medication Sig Dispense Refill  . aspirin EC 81 MG EC tablet Take 1 tablet (81 mg total) by mouth daily. 90 tablet 0  . Cholecalciferol (VITAMIN D3) 5000 UNITS TABS Take 1 tablet by mouth daily.    Marland Kitchen levonorgestrel (MIRENA) 20 MCG/24HR IUD 1 each by Intrauterine route once.    Marland Kitchen MAGNESIUM PO Take by mouth daily.    . Multiple Vitamins-Minerals (MULTIVITAMIN ADULTS PO) Take by mouth.    Marland Kitchen POTASSIUM PO Take by mouth daily. Powder    . venlafaxine XR (EFFEXOR-XR) 37.5 MG 24 hr capsule Take 1 capsule (37.5 mg total) by mouth daily. 90 capsule 1   No current facility-administered medications on file prior to visit.    Allergies:   Allergies  Allergen Reactions  . Crestor [Rosuvastatin]     Muscle aches and pains  . Lipitor [Atorvastatin]     Muscle pain  . Other     Darvocet- hallucinations  . Sulfonamide Derivatives     REACTION: fever     Today's Vitals   05/19/20 1333  BP: 118/79  Pulse: (!) 57  Weight: 143 lb 3.2 oz (65 kg)  Height: 5\' 4"  (1.626 m)   Body mass index is 24.58 kg/m.   Physical Exam General: well developed, well  nourished pleasant middle-age Caucasian lady, seated, in no evident distress Head: head normocephalic and atraumatic.  Neck: supple with no carotid or supraclavicular bruits Cardiovascular: regular rate and rhythm, no murmurs Musculoskeletal: no deformity Skin:  no rash/petichiae Vascular:  Normal pulses all extremities  Neurologic Exam Mental Status: Awake and fully alert.  Fluent speech and language.  Oriented to place and time. Recent and remote memory intact. Attention span, concentration and fund of knowledge appropriate. Mood and affect appropriate.  Cranial Nerves: Pupils equal, briskly reactive to light. Extraocular movements full without nystagmus. Visual fields full to confrontation. Hearing intact. Facial sensation intact. Face, tongue, palate moves normally and symmetrically.  Motor: Normal bulk and tone. Normal strength in all tested extremity muscles. Sensory.: intact to touch ,pinprick .position and vibratory sensation.  Coordination: Rapid alternating movements normal in all extremities. Finger-to-nose and heel-to-shin performed accurately bilaterally. Gait and Station: Arises from chair without difficulty. Stance is normal. Gait demonstrates normal stride length and balance . Able to heel, toe and tandem walk without difficulty.  Reflexes: 1+ and symmetric. Toes downgoing.      ASSESSMENT/PLAN: 49 year old Caucasian lady with large left basal ganglia infarct in May 2020 of cryptogenic etiology s/p loop recorder placement.  Vascular risk factors of hyperlipidemia only.  -She has done well with residual occasional word finding difficulty usually with increased fatigue otherwise denies any other residual deficits. -Advised to continue aspirin 81 mg daily for secondary stroke prevention -Was recommended to initiate Repatha due to statin intolerance at prior visit but she did not start medication due to concern of potential side effects.  She is not interested in initiating Zetia  or any other type of cholesterol-lowering agent.  She is willing to initiate omega-3 fatty acid.  Plans on repeat lipid panel with PCP in 3 months.  She is aware of increased risk of reoccurring stroke with uncontrolled HLD and lack of appropriate medication management. -Continue to follow with PCP for HLD management with LDL goal<70 -Loop recorder has not shown atrial fibrillation thus far; will continue to be monitored by cardiology  Overall stable from stroke standpoint following closely with PCP therefore recommend follow-up on an as-needed basis  I spent 25 minutes of face-to-face and non-face-to-face time with patient.  This included previsit chart review, lab review, study review, order entry, electronic health record documentation, patient education regarding history of cryptogenic stroke and review of loop recorder, mild residual deficit, use of recommended medications and further treatment options for cholesterol management and answered all questions to patient satisfaction   Frann Rider, AGNP-BC  [facility] [address] [place], [place] [address]  Phone [phone] Fax [phone] Note: This document was prepared with digital dictation and possible smart phrase technology. Any transcriptional errors that result from this process are unintentional.

## 2020-05-19 NOTE — Patient Instructions (Addendum)
Continue aspirin 81 mg daily for secondary stroke prevention  Would recommend use of cholesterol lowering medication regimen - as you are hesitant to use statins or Zetia would recommend use of omega 3 fatty acid for cholesterol levels   Loop recorder will continue to be monitored for possible atrial fibrillation  Continue to follow up with PCP regarding cholesterol and blood pressure management  Maintain strict control of hypertension with blood pressure goal below 130/90 and cholesterol with LDL cholesterol (bad cholesterol) goal below 70 mg/dL.     Overall stable from stroke standpoint therefore recommend follow up as needed     Thank you for coming to see [place] at [facility]. I hope we have been able to provide you high quality care today.  You may receive a patient satisfaction survey over the next few weeks. We would appreciate your feedback and comments so that we may continue to improve ourselves and the health of our patients.

## 2020-05-20 NOTE — Progress Notes (Signed)
I agree with the above plan 

## 2020-05-26 ENCOUNTER — Encounter: Payer: Self-pay | Admitting: Dermatology

## 2020-05-26 ENCOUNTER — Ambulatory Visit: Payer: 59 | Admitting: Dermatology

## 2020-05-26 ENCOUNTER — Other Ambulatory Visit: Payer: Self-pay

## 2020-05-26 DIAGNOSIS — D2272 Melanocytic nevi of left lower limb, including hip: Secondary | ICD-10-CM

## 2020-05-26 DIAGNOSIS — L814 Other melanin hyperpigmentation: Secondary | ICD-10-CM | POA: Diagnosis not present

## 2020-05-26 DIAGNOSIS — D229 Melanocytic nevi, unspecified: Secondary | ICD-10-CM

## 2020-05-26 DIAGNOSIS — D1801 Hemangioma of skin and subcutaneous tissue: Secondary | ICD-10-CM

## 2020-05-26 DIAGNOSIS — Z1283 Encounter for screening for malignant neoplasm of skin: Secondary | ICD-10-CM

## 2020-05-26 NOTE — Patient Instructions (Signed)
Dermatoscope- shows no atypical moles on back-  Tan spot on left sideburn/cheek- solar lentigo- from sun damage Left thigh- moles show no atypical features-  Red spot on tummy - cherry  Angioma- no danger/benign

## 2020-06-06 LAB — CUP PACEART REMOTE DEVICE CHECK
Date Time Interrogation Session: 20210922233510
Implantable Pulse Generator Implant Date: 20200707

## 2020-06-07 ENCOUNTER — Ambulatory Visit (INDEPENDENT_AMBULATORY_CARE_PROVIDER_SITE_OTHER): Payer: 59 | Admitting: Emergency Medicine

## 2020-06-07 DIAGNOSIS — I6389 Other cerebral infarction: Secondary | ICD-10-CM

## 2020-06-08 NOTE — Progress Notes (Signed)
Carelink Summary Report / Loop Recorder 

## 2020-06-09 ENCOUNTER — Other Ambulatory Visit: Payer: Self-pay | Admitting: Obstetrics and Gynecology

## 2020-06-09 DIAGNOSIS — Z1231 Encounter for screening mammogram for malignant neoplasm of breast: Secondary | ICD-10-CM

## 2020-06-12 MED FILL — VENLAFAXINE HCL ER 37.5 MG: 37.5 | 90 days supply | Qty: 90 | Fill #2

## 2020-07-01 NOTE — Progress Notes (Signed)
   New Patient   Subjective  Judy Walter. is a 49 y.o. female who presents for the following: Annual Exam (back x few months- itchy).  Annual skin exam. Location:  Duration:  Quality:  Associated Signs/Symptoms: Modifying Factors:  Severity:  Timing: Context:    The following portions of the chart were reviewed this encounter and updated as appropriate: Tobacco  Allergies  Meds  Problems  Med Hx  Surg Hx  Fam Hx      Objective  Well appearing patient in no apparent distress; mood and affect are within normal limits.  A full examination was performed including scalp, head, eyes, ears, nose, lips, neck, chest, axillae, abdomen, back, buttocks, bilateral upper extremities, bilateral lower extremities, hands, feet, fingers, toes, fingernails, and toenails. All findings within normal limits unless otherwise noted below.   Assessment & Plan     Dermatoscope- shows no atypical moles on back-  Tan spot on left sideburn/cheek- solar lentigo- from sun damage Left thigh- moles show no atypical features-  Red spot on tummy - cherry  Angioma- no danger/benign   Screening for malignant neoplasm of skin Mid Back  Annual skin examination.  Solar lentigo (2) Left Zygomatic Area; Left Buccal Cheek   No treatment necessary.  Leave if stable.  Hemangioma of skin Right Abdomen (side) - Upper  Multiple hemangiomas no treatment necessary.  Leave if stable.  Nevus Left Thigh - Anterior  Moles show no atypical features.  Leave if stable.

## 2020-07-02 ENCOUNTER — Encounter: Payer: Self-pay | Admitting: Dermatology

## 2020-07-02 DIAGNOSIS — E785 Hyperlipidemia, unspecified: Secondary | ICD-10-CM | POA: Diagnosis not present

## 2020-07-10 LAB — CUP PACEART REMOTE DEVICE CHECK
Date Time Interrogation Session: 20211026000119
Implantable Pulse Generator Implant Date: 20200707

## 2020-07-12 ENCOUNTER — Ambulatory Visit (INDEPENDENT_AMBULATORY_CARE_PROVIDER_SITE_OTHER): Payer: 59

## 2020-07-12 DIAGNOSIS — I639 Cerebral infarction, unspecified: Secondary | ICD-10-CM | POA: Diagnosis not present

## 2020-07-14 NOTE — Progress Notes (Signed)
Carelink Summary Report / Loop Recorder 

## 2020-07-20 ENCOUNTER — Other Ambulatory Visit: Payer: Self-pay

## 2020-07-20 ENCOUNTER — Ambulatory Visit
Admission: RE | Admit: 2020-07-20 | Discharge: 2020-07-20 | Disposition: A | Discharge status: Home / Self Care | Payer: 59 | Source: Ambulatory Visit | Attending: Obstetrics and Gynecology | Admitting: Obstetrics and Gynecology

## 2020-07-20 DIAGNOSIS — Z1231 Encounter for screening mammogram for malignant neoplasm of breast: Secondary | ICD-10-CM | POA: Diagnosis not present

## 2020-08-13 LAB — CUP PACEART REMOTE DEVICE CHECK
Date Time Interrogation Session: 20211127231251
Implantable Pulse Generator Implant Date: 20200707

## 2020-08-16 ENCOUNTER — Ambulatory Visit (INDEPENDENT_AMBULATORY_CARE_PROVIDER_SITE_OTHER): Payer: 59

## 2020-08-16 DIAGNOSIS — I639 Cerebral infarction, unspecified: Secondary | ICD-10-CM | POA: Diagnosis not present

## 2020-08-25 NOTE — Progress Notes (Signed)
Carelink Summary Report / Loop Recorder 

## 2020-09-17 MED FILL — VENLAFAXINE HCL ER 37.5 MG: 37.5 | 90 days supply | Qty: 90 | Fill #3

## 2020-09-19 LAB — CUP PACEART REMOTE DEVICE CHECK
Date Time Interrogation Session: 20220108231235
Implantable Pulse Generator Implant Date: 20200707

## 2020-09-20 ENCOUNTER — Ambulatory Visit (INDEPENDENT_AMBULATORY_CARE_PROVIDER_SITE_OTHER): Payer: 59

## 2020-09-20 DIAGNOSIS — I639 Cerebral infarction, unspecified: Secondary | ICD-10-CM | POA: Diagnosis not present

## 2020-10-01 NOTE — Progress Notes (Signed)
Carelink Summary Report / Loop Recorder ° °

## 2020-10-22 LAB — CUP PACEART REMOTE DEVICE CHECK
Date Time Interrogation Session: 20220210232910
Implantable Pulse Generator Implant Date: 20200707

## 2020-10-25 ENCOUNTER — Ambulatory Visit (INDEPENDENT_AMBULATORY_CARE_PROVIDER_SITE_OTHER): Payer: 59

## 2020-10-25 DIAGNOSIS — I639 Cerebral infarction, unspecified: Secondary | ICD-10-CM

## 2020-10-28 NOTE — Progress Notes (Signed)
Carelink Summary Report / Loop Recorder 

## 2020-11-25 LAB — CUP PACEART REMOTE DEVICE CHECK
Date Time Interrogation Session: 20220316003104
Implantable Pulse Generator Implant Date: 20200707

## 2020-11-29 ENCOUNTER — Telehealth: Payer: Self-pay | Admitting: *Deleted

## 2020-11-29 ENCOUNTER — Ambulatory Visit (INDEPENDENT_AMBULATORY_CARE_PROVIDER_SITE_OTHER): Payer: 59

## 2020-11-29 DIAGNOSIS — I639 Cerebral infarction, unspecified: Secondary | ICD-10-CM | POA: Diagnosis not present

## 2020-11-29 DIAGNOSIS — Z30432 Encounter for removal of intrauterine contraceptive device: Secondary | ICD-10-CM

## 2020-11-29 NOTE — Telephone Encounter (Signed)
Patient left voicemail asking if she should have Mirena IUD removed this month since she is at 7 year mark.

## 2020-11-29 NOTE — Telephone Encounter (Signed)
RN returned call to patient. Mirena IUD placed 11-10-13, RN advised past 7 year mark, due for removal. Patient agreeable to schedule removal. States she will not have replaced- patient is menopausal. IUD removal scheduled for 12-07-20 at 1100. Patient agreeable to date and time of appointment. Order placed for IUD removal and patient advised would be contacted for benefits. Patient agreeable.   Routing to provider and will close encounter.

## 2020-12-02 ENCOUNTER — Other Ambulatory Visit (HOSPITAL_COMMUNITY): Payer: Self-pay

## 2020-12-06 NOTE — Progress Notes (Signed)
Carelink Summary Report / Loop Recorder 

## 2020-12-07 ENCOUNTER — Encounter: Payer: Self-pay | Admitting: Obstetrics and Gynecology

## 2020-12-07 ENCOUNTER — Other Ambulatory Visit: Payer: Self-pay

## 2020-12-07 ENCOUNTER — Ambulatory Visit (INDEPENDENT_AMBULATORY_CARE_PROVIDER_SITE_OTHER): Payer: 59 | Admitting: Obstetrics and Gynecology

## 2020-12-07 DIAGNOSIS — Z30432 Encounter for removal of intrauterine contraceptive device: Secondary | ICD-10-CM

## 2020-12-07 NOTE — Progress Notes (Signed)
GYNECOLOGY  VISIT   HPI: 50 y.o.   Married  Caucasian  female   [phone] with No LMP recorded. (Menstrual status: IUD).   here for Mirena IUD removal.  Has been in for 7 years.  Occasional hot flash.  Labs 12/08/19:   [place] 26.6 E2 <5  [facility] 12/22/19 <0.015  GYNECOLOGIC HISTORY: No LMP recorded. (Menstrual status: IUD). Contraception:  Mirena IUD 11-10-13 Menopausal hormone therapy:  none Last mammogram: 07-20-20 3D/Neg/Birads1 Last pap smear: 12-08-19 ASCUS:Neg HR HPV--ECC LGSIL,11-27-18 ASCUS:Neg HR HPV, 11-02-17 Neg:Neg HR HPV        OB History    Gravida  4   Para  3   Term      Preterm      AB  1   Living  3     SAB  1   IAB      Ectopic      Multiple      Live Births                 Patient Active Problem List   Diagnosis Date Noted  . Cryptogenic stroke ([place]) 03/18/2019  . Palpitations 03/11/2019  . Infarction of left basal ganglia ([place]) 02/06/2019  . HYPERLIPIDEMIA TYPE I / IV 04/14/2009  . 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 Abnormal Pap smear of cervix 06/2015   ASCUS   . Cervical dysplasia 2017   colposcopic biopsy  . Elevated cholesterol   . Leukemia [facility])    age 67, s/p chemotherapy  . Migraines   . Osteopenia 08/2014  . Personal history of venous thrombosis and embolism   . Stroke ([place]) 02/06/2019   acute left basal ganglia  . Vitamin D deficiency     Past Surgical History:  Procedure Laterality Date  . C-sections  2002, 2008  . DILATION AND CURETTAGE OF UTERUS  1997  . wisdom teeth extracted      Current Outpatient Medications  Medication Sig Dispense Refill  . aspirin EC 81 MG EC tablet Take 1 tablet (81 mg total) by mouth daily. 90 tablet 0  . Cholecalciferol (VITAMIN D3) 5000 UNITS TABS Take 1 tablet by mouth daily.    Marland Kitchen levonorgestrel (MIRENA) 20 MCG/24HR IUD 1 each by Intrauterine route once.    Marland Kitchen MAGNESIUM PO Take by mouth daily.    . Multiple Vitamins-Minerals (MULTIVITAMIN  ADULTS PO) Take by mouth.    Marland Kitchen POTASSIUM PO Take by mouth daily. Powder    . venlafaxine XR (EFFEXOR-XR) 37.5 MG 24 hr capsule Take 1 capsule (37.5 mg total) by mouth daily. 90 capsule 1   No current facility-administered medications for this visit.     ALLERGIES: Crestor [rosuvastatin], Lipitor [atorvastatin], Other, and Sulfonamide derivatives  Family History  Problem Relation Age of Onset  . Coronary artery disease Father   . Hypertension Father   . Hemochromatosis Father   . Cancer Father        prostate  . Heart attack Father   . Stroke Mother   . Coronary artery disease Paternal Grandmother   . Diabetes Paternal Grandmother   . Hypertension Paternal Grandmother   . Breast cancer Maternal Grandmother   . Osteoporosis Maternal Grandmother   . Alzheimer's disease Maternal Grandmother     Social History   Socioeconomic History  . Marital status: Married    Spouse name: Not on file  . Number of children: Not on file  . Years of  education: Not on file  . Highest education level: Not on file  Occupational History  . 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 Smoking status: Never Smoker  . Smokeless tobacco: Never Used  Vaping Use  . Vaping Use: Never used  Substance and Sexual Activity  . Alcohol use: No    Alcohol/week: 0.0 standard drinks    Comment: rare wine  . Drug use: No  . Sexual activity: Yes    Partners: Male    Birth control/protection: I.U.D.    Comment: Mirena inserted 11-10-13   Other Topics Concern  . Not on file  Social History Narrative   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    Social Determinants of Health   Financial Resource Strain: Not on file  Food Insecurity: Not on file  Transportation Needs: Not on file  Physical Activity: Not on file  Stress: Not on file  Social Connections: Not on file  Intimate Partner Violence: Not on file    Review of  Systems  PHYSICAL EXAMINATION:    There were no vitals taken for this visit.    General appearance: alert, cooperative and appears stated age  Pelvic: External genitalia:  no lesions              Urethra:  normal appearing urethra with no masses, tenderness or lesions              Bartholins and Skenes: normal                 Vagina: normal appearing vagina with normal color and discharge, no lesions              Cervix: no lesions.  IUD strings noted.                 Bimanual Exam:  Uterus:  normal size, contour, position, consistency, mobility, non-tender              Adnexa: no mass, fullness, tenderness            Mirena IUD removal. Verbal consent for procedure.  Ring forceps used to remove IUD intact, shown to patient, and discarded.  Chaperone was present for exam.  ASSESSMENT  Expired Mirena IUD.  Hx PE ad stroke.   PLAN  IUD removed today. She declines a recheck of her [place] and E2, but will consider in May, 2022.  Fu for annual and with vaginal bleeding outside of the bleeding related to IUD removal today.

## 2020-12-20 ENCOUNTER — Other Ambulatory Visit: Payer: Self-pay | Admitting: Obstetrics and Gynecology

## 2020-12-21 ENCOUNTER — Other Ambulatory Visit (HOSPITAL_COMMUNITY): Payer: Self-pay

## 2020-12-21 ENCOUNTER — Other Ambulatory Visit: Payer: Self-pay | Admitting: *Deleted

## 2020-12-21 MED ORDER — VENLAFAXINE HCL ER 37.5 MG PO CP24
37.5000 mg | ORAL_CAPSULE | Freq: Every day | ORAL | 0 refills | Status: DC
  Filled 2020-12-21: qty 30, 30d supply, fill #0

## 2020-12-21 NOTE — Telephone Encounter (Signed)
Message left to return call to [place] at [phone].   Refill request received from [facility] for refill of Effexor. Patient has 2 pharmacies listed on profile. Need patient to advise on pharmacy.

## 2020-12-21 NOTE — Telephone Encounter (Signed)
Patient called back and requesting refill on Effexor 37.5 mg tablet, annual exam scheduled on 01/12/21

## 2020-12-22 ENCOUNTER — Other Ambulatory Visit (HOSPITAL_COMMUNITY): Payer: Self-pay

## 2020-12-27 ENCOUNTER — Ambulatory Visit (INDEPENDENT_AMBULATORY_CARE_PROVIDER_SITE_OTHER): Payer: 59

## 2020-12-27 DIAGNOSIS — I639 Cerebral infarction, unspecified: Secondary | ICD-10-CM

## 2020-12-29 LAB — CUP PACEART REMOTE DEVICE CHECK
Date Time Interrogation Session: 20220418010216
Implantable Pulse Generator Implant Date: 20200707

## 2021-01-11 NOTE — Progress Notes (Signed)
Carelink Summary Report / Loop Recorder 

## 2021-01-12 ENCOUNTER — Ambulatory Visit: Payer: 59 | Admitting: Obstetrics and Gynecology

## 2021-01-13 NOTE — Addendum Note (Signed)
Addended by: Douglass Rivers D on: 01/13/2021 10:16 AM   Modules accepted: Level of Service

## 2021-01-17 ENCOUNTER — Other Ambulatory Visit: Payer: Self-pay | Admitting: Obstetrics and Gynecology

## 2021-01-17 ENCOUNTER — Other Ambulatory Visit (HOSPITAL_COMMUNITY): Payer: Self-pay

## 2021-01-17 MED ORDER — VENLAFAXINE HCL ER 37.5 MG PO CP24
37.5000 mg | ORAL_CAPSULE | Freq: Every day | ORAL | 1 refills | Status: DC
  Filled 2021-01-17: qty 90, 90d supply, fill #0

## 2021-01-17 NOTE — Telephone Encounter (Signed)
Medication refill request: Effexor  Last AEX:  12-08-19 BS Next AEX: 06-09-21  Last MMG (if hormonal medication request): n/a Refill authorized: Today, please advise.   Medication pended for #90, 1RF. Please refill if appropriate.

## 2021-01-18 ENCOUNTER — Other Ambulatory Visit (HOSPITAL_COMMUNITY): Payer: Self-pay

## 2021-01-31 ENCOUNTER — Ambulatory Visit (INDEPENDENT_AMBULATORY_CARE_PROVIDER_SITE_OTHER): Payer: 59

## 2021-01-31 DIAGNOSIS — I639 Cerebral infarction, unspecified: Secondary | ICD-10-CM | POA: Diagnosis not present

## 2021-02-01 LAB — CUP PACEART REMOTE DEVICE CHECK
Date Time Interrogation Session: 20220521010116
Implantable Pulse Generator Implant Date: 20200707

## 2021-02-11 ENCOUNTER — Other Ambulatory Visit (HOSPITAL_COMMUNITY): Payer: Self-pay

## 2021-02-11 ENCOUNTER — Other Ambulatory Visit: Payer: Self-pay

## 2021-02-11 ENCOUNTER — Other Ambulatory Visit (HOSPITAL_COMMUNITY)
Admission: RE | Admit: 2021-02-11 | Discharge: 2021-02-11 | Disposition: A | Discharge status: Home / Self Care | Payer: 59 | Source: Ambulatory Visit | Attending: Obstetrics and Gynecology | Admitting: Obstetrics and Gynecology

## 2021-02-11 ENCOUNTER — Ambulatory Visit (INDEPENDENT_AMBULATORY_CARE_PROVIDER_SITE_OTHER): Payer: 59 | Admitting: Obstetrics and Gynecology

## 2021-02-11 ENCOUNTER — Encounter: Payer: Self-pay | Admitting: Obstetrics and Gynecology

## 2021-02-11 VITALS — BP 116/74 | HR 63 | Ht 64.0 in | Wt 147.0 lb

## 2021-02-11 DIAGNOSIS — Z01419 Encounter for gynecological examination (general) (routine) without abnormal findings: Secondary | ICD-10-CM | POA: Insufficient documentation

## 2021-02-11 DIAGNOSIS — N3281 Overactive bladder: Secondary | ICD-10-CM | POA: Diagnosis not present

## 2021-02-11 MED ORDER — OXYBUTYNIN CHLORIDE ER 10 MG PO TB24
10.0000 mg | ORAL_TABLET | Freq: Every day | ORAL | 1 refills | Status: DC
  Filled 2021-02-11: qty 30, 30d supply, fill #0
  Filled 2021-03-15: qty 30, 30d supply, fill #1

## 2021-02-11 MED ORDER — VENLAFAXINE HCL ER 37.5 MG PO CP24
37.5000 mg | ORAL_CAPSULE | Freq: Every day | ORAL | 3 refills | Status: DC
  Filled 2021-02-11 – 2021-04-11 (×2): qty 90, 90d supply, fill #0
  Filled 2021-07-13: qty 90, 90d supply, fill #1
  Filled 2021-10-10: qty 90, 90d supply, fill #2
  Filled 2022-01-11: qty 90, 90d supply, fill #3

## 2021-02-11 NOTE — Patient Instructions (Addendum)
EXERCISE AND DIET:  We recommended that you start or continue a regular exercise program for good health. Regular exercise means any activity that makes your heart beat faster and makes you sweat.  We recommend exercising at least 30 minutes per day at least 3 days a week, preferably 4 or 5.  We also recommend a diet low in fat and sugar.  Inactivity, poor dietary choices and obesity can cause diabetes, heart attack, stroke, and kidney damage, among others.    ALCOHOL AND SMOKING:  Women should limit their alcohol intake to no more than 7 drinks/beers/glasses of wine (combined, not each!) per week. Moderation of alcohol intake to this level decreases your risk of breast cancer and liver damage. And of course, no recreational drugs are part of a healthy lifestyle.  And absolutely no smoking or even second hand smoke. Most people know smoking can cause heart and lung diseases, but did you know it also contributes to weakening of your bones? Aging of your skin?  Yellowing of your teeth and nails?  CALCIUM AND VITAMIN D:  Adequate intake of calcium and Vitamin D are recommended.  The recommendations for exact amounts of these supplements seem to change often, but generally speaking 600 mg of calcium (either carbonate or citrate) and 800 units of Vitamin D per day seems prudent. Certain women may benefit from higher intake of Vitamin D.  If you are among these women, your doctor will have told you during your visit.    PAP SMEARS:  Pap smears, to check for cervical cancer or precancers,  have traditionally been done yearly, although recent scientific advances have shown that most women can have pap smears less often.  However, every woman still should have a physical exam from her gynecologist every year. It will include a breast check, inspection of the vulva and vagina to check for abnormal growths or skin changes, a visual exam of the cervix, and then an exam to evaluate the size and shape of the uterus and  ovaries.  And after 51 years of age, a rectal exam is indicated to check for rectal cancers. We will also provide age appropriate advice regarding health maintenance, like when you should have certain vaccines, screening for sexually transmitted diseases, bone density testing, colonoscopy, mammograms, etc.   MAMMOGRAMS:  All women over 40 years old should have a yearly mammogram. Many facilities now offer a "3D" mammogram, which may cost around $50 extra out of pocket. If possible,  we recommend you accept the option to have the 3D mammogram performed.  It both reduces the number of women who will be called back for extra views which then turn out to be normal, and it is better than the routine mammogram at detecting truly abnormal areas.    COLONOSCOPY:  Colonoscopy to screen for colon cancer is recommended for all women at age 50.  We know, you hate the idea of the prep.  We agree, BUT, having colon cancer and not knowing it is worse!!  Colon cancer so often starts as a polyp that can be seen and removed at colonscopy, which can quite literally save your life!  And if your first colonoscopy is normal and you have no family history of colon cancer, most women don't have to have it again for 10 years.  Once every ten years, you can do something that may end up saving your life, right?  We will be happy to help you get it scheduled when you are ready.    Be sure to check your insurance coverage so you understand how much it will cost.  It may be covered as a preventative service at no cost, but you should check your particular policy.     Oxybutynin extended-release tablets What is this medicine? OXYBUTYNIN (ox i BYOO ti nin) is used to treat overactive bladder. This medicine reduces the amount of bathroom visits. It may also help to control wetting accidents. This medicine may be used for other purposes; ask your health care provider or pharmacist if you have questions. COMMON BRAND NAME(S): Ditropan  XL What should I tell my health care provider before I take this medicine? They need to know if you have any of these conditions:  autonomic neuropathy  dementia  difficulty passing urine  glaucoma  intestinal obstruction  kidney disease  liver disease  myasthenia gravis  Parkinson's disease  an unusual or allergic reaction to oxybutynin, other medicines, foods, dyes, or preservatives  pregnant or trying to get pregnant  breast-feeding How should I use this medicine? Take this medicine by mouth with a glass of water. Swallow whole, do not crush, cut, or chew. Follow the directions on the prescription label. You can take this medicine with or without food. Take your doses at regular intervals. Do not take your medicine more often than directed. Talk to your pediatrician regarding the use of this medicine in children. Special care may be needed. While this drug may be prescribed for children as young as 6 years for selected conditions, precautions do apply. Overdosage: If you think you have taken too much of this medicine contact a poison control center or emergency room at once. NOTE: This medicine is only for you. Do not share this medicine with others. What if I miss a dose? If you miss a dose, take it as soon as you can. If it is almost time for your next dose, take only that dose. Do not take double or extra doses. What may interact with this medicine?  antihistamines for allergy, cough and cold  atropine  certain medicines for bladder problems like oxybutynin, tolterodine  certain medicines for Parkinson's disease like benztropine, trihexyphenidyl  certain medicines for stomach problems like dicyclomine, hyoscyamine  certain medicines for travel sickness like scopolamine  clarithromycin  erythromycin  ipratropium  medicines for fungal infections, like fluconazole, itraconazole, ketoconazole or voriconazole This list may not describe all possible  interactions. Give your health care provider a list of all the medicines, herbs, non-prescription drugs, or dietary supplements you use. Also tell them if you smoke, drink alcohol, or use illegal drugs. Some items may interact with your medicine. What should I watch for while using this medicine? It may take a few weeks to notice the full benefit from this medicine. You may need to limit your intake tea, coffee, caffeinated sodas, and alcohol. These drinks may make your symptoms worse. You may get drowsy or dizzy. Do not drive, use machinery, or do anything that needs mental alertness until you know how this medicine affects you. Do not stand or sit up quickly, especially if you are an older patient. This reduces the risk of dizzy or fainting spells. Alcohol may interfere with the effect of this medicine. Avoid alcoholic drinks. Your mouth may get dry. Chewing sugarless gum or sucking hard candy, and drinking plenty of water may help. Contact your doctor if the problem does not go away or is severe. This medicine may cause dry eyes and blurred vision. If you wear contact lenses,  you may feel some discomfort. Lubricating drops may help. See your eyecare professional if the problem does not go away or is severe. You may notice the shells of the tablets in your stool from time to time. This is normal. Avoid extreme heat. This medicine can cause you to sweat less than normal. Your body temperature could increase to dangerous levels, which may lead to heat stroke. What side effects may I notice from receiving this medicine? Side effects that you should report to your doctor or health care professional as soon as possible:  allergic reactions like skin rash, itching or hives, swelling of the face, lips, or tongue  agitation  breathing problems  confusion  fever  flushing (reddening of the skin)  hallucinations  memory loss  pain or difficulty passing urine  palpitations  unusually weak or  tired Side effects that usually do not require medical attention (report to your doctor or health care professional if they continue or are bothersome):  constipation  headache  sexual difficulties (impotence) This list may not describe all possible side effects. Call your doctor for medical advice about side effects. You may report side effects to FDA at 1-800-FDA-1088. Where should I keep my medicine? Keep out of the reach of children. Store at room temperature between 15 and 30 degrees C (59 and 86 degrees F). Protect from moisture and humidity. Throw away any unused medicine after the expiration date. NOTE: This sheet is a summary. It may not cover all possible information. If you have questions about this medicine, talk to your doctor, pharmacist, or health care provider.  2021 Elsevier/Gold Standard (2013-11-13 10:57:06)  Overactive Bladder, Adult  Overactive bladder is a condition in which a person has a sudden and frequent need to urinate. A person might also leak urine if he or she cannot get to the bathroom fast enough (urinary incontinence). Sometimes, symptoms can interfere with work or social activities. What are the causes? Overactive bladder is associated with poor nerve signals between your bladder and your brain. Your bladder may get the signal to empty before it is full. You may also have very sensitive muscles that make your bladder squeeze too soon. This condition may also be caused by other factors, such as:  Medical conditions: ? Urinary tract infection. ? Infection of nearby tissues. ? Prostate enlargement. ? Bladder stones, inflammation, or tumors. ? Diabetes. ? Muscle or nerve weakness, especially from these conditions:  A spinal cord injury.  Stroke.  Multiple sclerosis.  Parkinson's disease.  Other causes: ? Surgery on the uterus or urethra. ? Drinking too much caffeine or alcohol. ? Certain medicines, especially those that eliminate extra fluid in  the body (diuretics). ? Constipation. What increases the risk? You may be at greater risk for overactive bladder if you:  Are an older adult.  Smoke.  Are going through menopause.  Have prostate problems.  Have a neurological disease, such as stroke, dementia, Parkinson's disease, or multiple sclerosis (MS).  Eat or drink alcohol, spicy food, caffeine, and other things that irritate the bladder.  Are overweight or obese. What are the signs or symptoms? Symptoms of this condition include a sudden, strong urge to urinate. Other symptoms include:  Leaking urine.  Urinating 8 or more times a day.  Waking up to urinate 2 or more times overnight. How is this diagnosed? This condition may be diagnosed based on:  Your symptoms and medical history.  A physical exam.  Blood or urine tests to check for possible  causes, such as infection. You may also need to see a health care provider who specializes in urinary tract problems. This is called a urologist. How is this treated? Treatment for overactive bladder depends on the cause of your condition and whether it is mild or severe. Treatment may include:  Bladder training, such as: ? Learning to control the urge to urinate by following a schedule to urinate at regular intervals. ? Doing Kegel exercises to strengthen the pelvic floor muscles that support your bladder.  Special devices, such as: ? Biofeedback. This uses sensors to help you become aware of your body's signals. ? Electrical stimulation. This uses electrodes placed inside the body (implanted) or outside the body. These electrodes send gentle pulses of electricity to strengthen the nerves or muscles that control the bladder. ? Women may use a plastic device, called a pessary, that fits into the vagina and supports the bladder.  Medicines, such as: ? Antibiotics to treat bladder infection. ? Antispasmodics to stop the bladder from releasing urine at the wrong  time. ? Tricyclic antidepressants to relax bladder muscles. ? Injections of botulinum toxin type A directly into the bladder tissue to relax bladder muscles.  Surgery, such as: ? A device may be implanted to help manage the nerve signals that control urination. ? An electrode may be implanted to stimulate electrical signals in the bladder. ? A procedure may be done to change the shape of the bladder. This is done only in very severe cases. Follow these instructions at home: Eating and drinking  Make diet or lifestyle changes recommended by your health care provider. These may include: ? Drinking fluids throughout the day and not only with meals. ? Cutting down on caffeine or alcohol. ? Eating a healthy and balanced diet to prevent constipation. This may include:  Choosing foods that are high in fiber, such as beans, whole grains, and fresh fruits and vegetables.  Limiting foods that are high in fat and processed sugars, such as fried and sweet foods.   Lifestyle  Lose weight if needed.  Do not use any products that contain nicotine or tobacco. These include cigarettes, chewing tobacco, and vaping devices, such as e-cigarettes. If you need help quitting, ask your health care provider.   General instructions  Take over-the-counter and prescription medicines only as told by your health care provider.  If you were prescribed an antibiotic medicine, take it as told by your health care provider. Do not stop taking the antibiotic even if you start to feel better.  Use any implants or pessary as told by your health care provider.  If needed, wear pads to absorb urine leakage.  Keep a log to track how much and when you drink, and when you need to urinate. This will help your health care provider monitor your condition.  Keep all follow-up visits. This is important. Contact a health care provider if:  You have a fever or chills.  Your symptoms do not get better with treatment.  Your  pain and discomfort get worse.  You have more frequent urges to urinate. Get help right away if:  You are not able to control your bladder. Summary  Overactive bladder refers to a condition in which a person has a sudden and frequent need to urinate.  Several conditions may lead to an overactive bladder.  Treatment for overactive bladder depends on the cause and severity of your condition.  Making lifestyle changes, doing Kegel exercises, keeping a log, and taking medicines can  help with this condition. This information is not intended to replace advice given to you by your health care provider. Make sure you discuss any questions you have with your health care provider. Document Revised: 05/17/2020 Document Reviewed: 05/17/2020 Elsevier Patient Education  2021 [employer].

## 2021-02-11 NOTE — Progress Notes (Signed)
50 y.o. [id number] Married Caucasian female here for annual exam.    No hot flashes.  Does like the Effexor.  Mirena IUD removed 12/07/20.  No period. Thinks she cut herself shaving recently.   Losing control of urine with sneeze and cough.  Having urgency.  Voiding every 1 - 2 hours.  Up once a night. Has been wearing pads.  Does Kegel's.  Denies hx of glaucoma or medication to control irregular heart beat.   PCP:   Jerene Bears, MD  No LMP recorded. (Menstrual status: IUD).           Sexually active: Yes.    The current method of family planning is none.    Exercising: Yes.      Smoker:  no  Health Maintenance: Pap:12-08-19 ASCUS neg HR HPV: 11/27/18 ASCUS, neg HR HPV:  11/02/17 neg, neg HR HPV. History of abnormal Pap:  yes MMG:07-20-20 neg Colonoscopy: not yet BMD: Normal with PCP.  TDaP:no Gardasil:   no HIV:neg Hep C:no Screening Labs:  Hb today:PCP, Urine today:PCP   reports that she has never smoked. She has never used smokeless tobacco. She reports current alcohol use. She reports that she does not use drugs.  Past Medical History:  Diagnosis Date  . Abnormal Pap smear of cervix 06/2015   ASCUS   . Cervical dysplasia 2017   colposcopic biopsy  . Elevated cholesterol   . Leukemia [facility])    age 37, s/p chemotherapy  . Migraines   . Osteopenia 08/2014  . Personal history of venous thrombosis and embolism   . Stroke ([place]) 02/06/2019   acute left basal ganglia  . Vitamin D deficiency     Past Surgical History:  Procedure Laterality Date  . C-sections  2002, 2008  . DILATION AND CURETTAGE OF UTERUS  1997  . wisdom teeth extracted      Current Outpatient Medications  Medication Sig Dispense Refill  . aspirin EC 81 MG EC tablet Take 1 tablet (81 mg total) by mouth daily. 90 tablet 0  . Cholecalciferol (VITAMIN D3) 5000 UNITS TABS Take 1 tablet by mouth daily.    . Multiple Vitamins-Minerals (MULTIVITAMIN ADULTS PO) Take by mouth.    . venlafaxine XR  (EFFEXOR-XR) 37.5 MG 24 hr capsule Take 1 capsule (37.5 mg total) by mouth daily. 90 capsule 1   No current facility-administered medications for this visit.    Family History  Problem Relation Age of Onset  . Coronary artery disease Father   . Hypertension Father   . Hemochromatosis Father   . Cancer Father        prostate  . Heart attack Father   . Stroke Mother   . Coronary artery disease Paternal Grandmother   . Diabetes Paternal Grandmother   . Hypertension Paternal Grandmother   . Breast cancer Maternal Grandmother   . Osteoporosis Maternal Grandmother   . Alzheimer's disease Maternal Grandmother     Review of Systems   See HPI.   Exam:   BP 116/74 (BP Location: Right Arm, Patient Position: Sitting, Cuff Size: Normal)   Pulse 63   Ht 5\' 4"  (1.626 m)   Wt 147 lb (66.7 kg)   SpO2 100%   BMI 25.23 kg/m     General appearance: alert, cooperative and appears stated age Head: normocephalic, without obvious abnormality, atraumatic Neck: no adenopathy, supple, symmetrical, trachea midline and thyroid normal to inspection and palpation Lungs: clear to auscultation bilaterally Breasts: normal appearance, no masses or tenderness, No  nipple retraction or dimpling, No nipple discharge or bleeding, No axillary adenopathy Heart: regular rate and rhythm Abdomen: soft, non-tender; no masses, no organomegaly Extremities: extremities normal, atraumatic, no cyanosis or edema Skin: skin color, texture, turgor normal. No rashes or lesions Lymph nodes: cervical, supraclavicular, and axillary nodes normal. Neurologic: grossly normal  Pelvic: External genitalia:  no lesions              No abnormal inguinal nodes palpated.              Urethra:  normal appearing urethra with no masses, tenderness or lesions              Bartholins and Skenes: normal                 Vagina: normal appearing vagina with normal color and discharge, no lesions              Cervix: no lesions               Pap taken: Yes.   Bimanual Exam:  Uterus:  normal size, contour, position, consistency, mobility, non-tender              Adnexa: no mass, fullness, tenderness              Rectal exam: Yes.  .  Confirms.              Anus:  normal sphincter tone, no lesions  Chaperone was present for exam.  Assessment:   Well woman visit with normal exam. Hx PE.  Stress incontinence and overactive bladder.  Hx cryptogenic stroke.  Loop recorder in left medial breast.  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   Plan: Mammogram screening discussed. Self breast awareness reviewed. Pap and HR HPV as above. Guidelines for Calcium, Vitamin D, regular exercise program including cardiovascular and weight bearing exercise. Refill of Effexor XR for one year.  Start Ditropan XL10 mg. Potential side effects discussed.  Avoid bladder irritants.   Consider pelvic floor therapy. Fu in 6 weeks.  Follow up annually and prn.

## 2021-02-14 LAB — CYTOLOGY - PAP
Comment: NEGATIVE
Diagnosis: UNDETERMINED — AB
High risk HPV: NEGATIVE

## 2021-02-18 NOTE — Progress Notes (Signed)
Carelink Summary Report / Loop Recorder 

## 2021-03-03 LAB — CUP PACEART REMOTE DEVICE CHECK
Date Time Interrogation Session: 20220623010908
Implantable Pulse Generator Implant Date: 20200707

## 2021-03-07 ENCOUNTER — Ambulatory Visit (INDEPENDENT_AMBULATORY_CARE_PROVIDER_SITE_OTHER): Payer: 59

## 2021-03-07 DIAGNOSIS — I639 Cerebral infarction, unspecified: Secondary | ICD-10-CM | POA: Diagnosis not present

## 2021-03-15 ENCOUNTER — Other Ambulatory Visit (HOSPITAL_COMMUNITY): Payer: Self-pay

## 2021-03-23 NOTE — Progress Notes (Signed)
Carelink Summary Report / Loop Recorder 

## 2021-03-25 ENCOUNTER — Other Ambulatory Visit: Payer: Self-pay

## 2021-03-25 ENCOUNTER — Other Ambulatory Visit (HOSPITAL_COMMUNITY): Payer: Self-pay

## 2021-03-25 ENCOUNTER — Ambulatory Visit: Payer: 59 | Admitting: Obstetrics and Gynecology

## 2021-03-25 ENCOUNTER — Telehealth: Payer: Self-pay | Admitting: Obstetrics and Gynecology

## 2021-03-25 ENCOUNTER — Encounter: Payer: Self-pay | Admitting: Obstetrics and Gynecology

## 2021-03-25 VITALS — BP 118/74

## 2021-03-25 DIAGNOSIS — N3946 Mixed incontinence: Secondary | ICD-10-CM | POA: Diagnosis not present

## 2021-03-25 MED ORDER — OXYBUTYNIN CHLORIDE ER 10 MG PO TB24: 10.0000 mg | ORAL_TABLET | Freq: Every day | ORAL | 3 refills | Status: DC

## 2021-03-25 MED ORDER — OXYBUTYNIN CHLORIDE ER 10 MG PO TB24
10.0000 mg | ORAL_TABLET | Freq: Every day | ORAL | 3 refills | Status: DC
  Filled 2021-03-25: qty 90, 90d supply, fill #0
  Filled 2021-04-11: qty 90, 90d supply, fill #1

## 2021-03-25 NOTE — Progress Notes (Signed)
GYNECOLOGY  VISIT   HPI: 50 y.o.   Married  Caucasian  female   [phone] with No LMP recorded. (Menstrual status: IUD).   here for  pelvic floor follow up check. Has mixed incontinence.   Urinary incontinence with sneeze and cough.  Started Ditropan XL 10 mg daily.  Was voiding every 1 - 2 hours, and is now voiding much less often.  Not up at night.  Having some dry mouth and bitter taste.  Thinks Ditropan XL is helping, and she wants to continue.   Drinks a Diet Sundrop in the am.   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  Her time will be more limited for appointments.   GYNECOLOGIC HISTORY: No LMP recorded. (Menstrual status: IUD). Contraception:  none Menopausal hormone therapy:  none Last mammogram:  07-20-20 Last pap smear:   12-08-19        OB History     Gravida  4   Para  3   Term      Preterm      AB  1   Living  3      SAB  1   IAB      Ectopic      Multiple      Live Births                 Patient Active Problem List   Diagnosis Date Noted   Cryptogenic stroke ([place]) 03/18/2019   Palpitations 03/11/2019   Infarction of left basal ganglia ([place]) 02/06/2019   HYPERLIPIDEMIA TYPE I / IV 04/14/2009   PULMONARY EMBOLISM, HX OF 04/03/2009    Past Medical History:  Diagnosis Date   Abnormal Pap smear of cervix 06/2015   ASCUS    Cervical dysplasia 2017   colposcopic biopsy   Elevated cholesterol    Leukemia ([place])    age 38, s/p chemotherapy   Migraines    Osteopenia 08/2014   Personal history of venous thrombosis and embolism    Stroke ([place]) 02/06/2019   acute left basal ganglia   Vitamin D deficiency     Past Surgical History:  Procedure Laterality Date   C-sections  2002, 2008   DILATION AND CURETTAGE OF UTERUS  1997   wisdom teeth extracted      Current Outpatient Medications  Medication Sig Dispense Refill   aspirin EC 81 MG EC tablet Take 1 tablet (81 mg total) by mouth daily. 90 tablet  0   Cholecalciferol (VITAMIN D3) 5000 UNITS TABS Take 1 tablet by mouth daily.     Multiple Vitamins-Minerals (MULTIVITAMIN ADULTS PO) Take by mouth.     venlafaxine XR (EFFEXOR-XR) 37.5 MG 24 hr capsule Take 1 capsule (37.5 mg total) by mouth daily. 90 capsule 3   oxybutynin (DITROPAN XL) 10 MG 24 hr tablet Take 1 tablet (10 mg total) by mouth at bedtime. 90 tablet 3   No current facility-administered medications for this visit.     ALLERGIES: Crestor [rosuvastatin], Lipitor [atorvastatin], Other, and Sulfonamide derivatives  Family History  Problem Relation Age of Onset   Coronary artery disease Father    Hypertension Father    Hemochromatosis Father    Cancer Father        prostate   Heart attack Father    Stroke Mother    Coronary artery disease Paternal Grandmother    Diabetes Paternal Grandmother    Hypertension Paternal Grandmother    Breast cancer Maternal Grandmother  Osteoporosis Maternal Grandmother    Alzheimer's disease Maternal Grandmother     Social History   Socioeconomic History   Marital status: Married    Spouse name: Not on file   Number of children: Not on file   Years of education: Not on file   Highest education level: Not on file  Occupational History   Occupation: RN    Employer: [employer]  Tobacco Use   Smoking status: Never   Smokeless tobacco: Never  Vaping Use   Vaping Use: Never used  Substance and Sexual Activity   Alcohol use: Yes    Alcohol/week: 0.0 standard drinks    Comment: rare wine   Drug use: No   Sexual activity: Yes    Partners: Male    Birth control/protection: Post-menopausal  Other Topics Concern   Not on file  Social History Narrative   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    Social Determinants of Health   Financial Resource Strain: Not on file  Food Insecurity: Not on file  Transportation Needs: Not on file  Physical Activity: Not on file   Stress: Not on file  Social Connections: Not on file  Intimate Partner Violence: Not on file    Review of Systems  See HPI.  PHYSICAL EXAMINATION:    BP 118/74 (BP Location: Right Arm, Patient Position: Sitting, Cuff Size: Normal)     General appearance: alert, cooperative and appears stated age  ASSESSMENT  Mixed incontinence. Overactive bladder improved with Ditropan XL. Hx PE and stroke.   PLAN  Dietary irritants reviewed.  Try sugar free gum or candy or Biotene for hydration of the mouth.  Continue Ditropan XL 10 mg dosage. Will refer for pelvic floor PT at [facility], which will be a convenient location for the patient.  Follow up for annual exam and prn.

## 2021-03-25 NOTE — Telephone Encounter (Signed)
Please make a referral for pelvic floor therapy for [facility] physical therapist for mixed incontinence.

## 2021-03-28 NOTE — Telephone Encounter (Signed)
Office notes faxed to [facility] for pelvic flor they will call to schedule.

## 2021-03-30 DIAGNOSIS — Z1331 Encounter for screening for depression: Secondary | ICD-10-CM | POA: Diagnosis not present

## 2021-03-30 DIAGNOSIS — Z789 Other specified health status: Secondary | ICD-10-CM | POA: Diagnosis not present

## 2021-03-30 DIAGNOSIS — Z0000 Encounter for general adult medical examination without abnormal findings: Secondary | ICD-10-CM | POA: Diagnosis not present

## 2021-03-30 DIAGNOSIS — Z299 Encounter for prophylactic measures, unspecified: Secondary | ICD-10-CM | POA: Diagnosis not present

## 2021-03-30 DIAGNOSIS — Z6826 Body mass index (BMI) 26.0-26.9, adult: Secondary | ICD-10-CM | POA: Diagnosis not present

## 2021-03-30 DIAGNOSIS — E785 Hyperlipidemia, unspecified: Secondary | ICD-10-CM | POA: Diagnosis not present

## 2021-04-01 DIAGNOSIS — E785 Hyperlipidemia, unspecified: Secondary | ICD-10-CM | POA: Diagnosis not present

## 2021-04-01 DIAGNOSIS — Z0000 Encounter for general adult medical examination without abnormal findings: Secondary | ICD-10-CM | POA: Diagnosis not present

## 2021-04-01 DIAGNOSIS — E559 Vitamin D deficiency, unspecified: Secondary | ICD-10-CM | POA: Diagnosis not present

## 2021-04-01 DIAGNOSIS — Z79899 Other long term (current) drug therapy: Secondary | ICD-10-CM | POA: Diagnosis not present

## 2021-04-05 ENCOUNTER — Encounter (INDEPENDENT_AMBULATORY_CARE_PROVIDER_SITE_OTHER): Payer: Self-pay | Admitting: *Deleted

## 2021-04-05 DIAGNOSIS — M62838 Other muscle spasm: Secondary | ICD-10-CM | POA: Diagnosis not present

## 2021-04-05 DIAGNOSIS — M6281 Muscle weakness (generalized): Secondary | ICD-10-CM | POA: Diagnosis not present

## 2021-04-05 DIAGNOSIS — M6289 Other specified disorders of muscle: Secondary | ICD-10-CM | POA: Diagnosis not present

## 2021-04-05 DIAGNOSIS — N393 Stress incontinence (female) (male): Secondary | ICD-10-CM | POA: Diagnosis not present

## 2021-04-05 NOTE — Telephone Encounter (Signed)
Patient scheduled on 04/05/21 @ 2pm with Dr. Arlington Calix.

## 2021-04-07 ENCOUNTER — Ambulatory Visit (INDEPENDENT_AMBULATORY_CARE_PROVIDER_SITE_OTHER): Payer: 59

## 2021-04-07 DIAGNOSIS — I639 Cerebral infarction, unspecified: Secondary | ICD-10-CM

## 2021-04-07 LAB — CUP PACEART REMOTE DEVICE CHECK
Date Time Interrogation Session: 20220726010923
Implantable Pulse Generator Implant Date: 20200707

## 2021-04-08 ENCOUNTER — Other Ambulatory Visit (HOSPITAL_COMMUNITY): Payer: Self-pay

## 2021-04-11 ENCOUNTER — Other Ambulatory Visit (HOSPITAL_COMMUNITY): Payer: Self-pay

## 2021-04-19 DIAGNOSIS — N393 Stress incontinence (female) (male): Secondary | ICD-10-CM | POA: Diagnosis not present

## 2021-04-19 DIAGNOSIS — M62838 Other muscle spasm: Secondary | ICD-10-CM | POA: Diagnosis not present

## 2021-04-19 DIAGNOSIS — M6289 Other specified disorders of muscle: Secondary | ICD-10-CM | POA: Diagnosis not present

## 2021-04-19 DIAGNOSIS — M6281 Muscle weakness (generalized): Secondary | ICD-10-CM | POA: Diagnosis not present

## 2021-05-03 ENCOUNTER — Ambulatory Visit (INDEPENDENT_AMBULATORY_CARE_PROVIDER_SITE_OTHER): Payer: 59 | Admitting: Internal Medicine

## 2021-05-03 ENCOUNTER — Encounter: Payer: Self-pay | Admitting: Internal Medicine

## 2021-05-03 ENCOUNTER — Other Ambulatory Visit: Payer: Self-pay

## 2021-05-03 VITALS — BP 122/80 | HR 59 | Ht 64.5 in | Wt 149.0 lb

## 2021-05-03 DIAGNOSIS — I639 Cerebral infarction, unspecified: Secondary | ICD-10-CM

## 2021-05-03 NOTE — Patient Instructions (Signed)
Medication Instructions:  Your physician recommends that you continue on your current medications as directed. Please refer to the Current Medication list given to you today.  *If you need a refill on your cardiac medications before your next appointment, please call your pharmacy*   Lab Work: NONE   If you have labs (blood work) drawn today and your tests are completely normal, you will receive your results only by: . MyChart Message (if you have MyChart) OR . A paper copy in the mail If you have any lab test that is abnormal or we need to change your treatment, we will call you to review the results.   Testing/Procedures: NONE    Follow-Up: At [facility], you and your health needs are our priority.  As part of our continuing mission to provide you with exceptional heart care, we have created designated Provider Care Teams.  These Care Teams include your primary Cardiologist (physician) and Advanced Practice Providers (APPs -  Physician Assistants and Nurse Practitioners) who all work together to provide you with the care you need, when you need it.  We recommend signing up for the patient portal called "MyChart".  Sign up information is provided on this After Visit Summary.  MyChart is used to connect with patients for Virtual Visits (Telemedicine).  Patients are able to view lab/test results, encounter notes, upcoming appointments, etc.  Non-urgent messages can be sent to your provider as well.   To learn more about what you can do with MyChart, go to [email or web address].    Your next appointment:   1 year(s)  The format for your next appointment:   In Person  Provider:   Gregg Taylor, MD   Other Instructions Thank you for choosing [facility]!    

## 2021-05-03 NOTE — Progress Notes (Signed)
Carelink Summary Report / Loop Recorder 

## 2021-05-03 NOTE — Progress Notes (Signed)
HPI Mrs. Hammell returns today for followup of a cryptogenic stroke. She underwent ILR insertion. She has done well in the interim with no neuro symptoms, chest pain or sob. No problems with the ILR. She was exercising but stopped. No palpitations.  Allergies  Allergen Reactions   Crestor [Rosuvastatin]     Muscle aches and pains   Lipitor [Atorvastatin]     Muscle pain   Other     Darvocet- hallucinations   Sulfonamide Derivatives     REACTION: fever     Current Outpatient Medications  Medication Sig Dispense Refill   aspirin EC 81 MG EC tablet Take 1 tablet (81 mg total) by mouth daily. 90 tablet 0   Cholecalciferol (VITAMIN D3) 5000 UNITS TABS Take 1 tablet by mouth daily.     Multiple Vitamins-Minerals (MULTIVITAMIN ADULTS PO) Take by mouth.     oxybutynin (DITROPAN XL) 10 MG 24 hr tablet Take 1 tablet (10 mg total) by mouth at bedtime. 90 tablet 3   venlafaxine XR (EFFEXOR-XR) 37.5 MG 24 hr capsule Take 1 capsule by mouth daily. 90 capsule 3   No current facility-administered medications for this visit.     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   Past Surgical History:  Procedure Laterality Date   C-sections  2002, 2008   DILATION AND CURETTAGE OF UTERUS  1997   wisdom teeth extracted       Family History  Problem Relation Age of Onset   Coronary artery disease Father    Hypertension Father    Hemochromatosis Father    Cancer Father        prostate   Heart attack Father    Stroke Mother    Coronary artery disease Paternal Grandmother     Diabetes Paternal Grandmother    Hypertension Paternal Grandmother    Breast cancer Maternal Grandmother    Osteoporosis Maternal Grandmother    Alzheimer's disease Maternal Grandmother      Social History   Socioeconomic History   Marital status: Married    Spouse name: Not on file   Number of children: Not on file   Years of education: Not on file   Highest education level: Not on file  Occupational History   Occupation: Therapist, sports    Employer: [employer]    Comment: [employer]  Tobacco Use   Smoking status: Never   Smokeless tobacco: Never  Vaping Use   Vaping Use: Never used  Substance and Sexual Activity   Alcohol use: Yes    Alcohol/week: 0.0 standard drinks    Comment: rare wine   Drug use: No   Sexual activity: Yes    Partners: Male    Birth control/protection: Post-menopausal  Other Topics Concern   Not on file  Social History Narrative   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    Social Determinants of Health   Financial Resource Strain: Not on file  Food  Insecurity: Not on file  Transportation Needs: Not on file  Physical Activity: Not on file  Stress: Not on file  Social Connections: Not on file  Intimate Partner Violence: Not on file     There were no vitals taken for this visit.  Physical Exam:  Well appearing NAD HEENT: Unremarkable Neck:  No JVD, no thyromegally Lymphatics:  No adenopathy Back:  No CVA tenderness Lungs:  Clear HEART:  Regular rate rhythm, no murmurs, no rubs, no clicks Abd:  soft, positive bowel sounds, no organomegally, no rebound, no guarding Ext:  2 plus pulses, no edema, no cyanosis, no clubbing Skin:  No rashes no nodules Neuro:  CN II through XII intact, motor grossly intact  EKG  DEVICE  Normal device function.  See PaceArt for details.   Assess/Plan:  1. Cryptogenic stroke - no recurrent symptoms. She will undergo watchful waiting. 2. ILR - her device is working normally  but demonstrates no arrhythmias 3. 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 We will follow.   Carleene Overlie Oreste Majeed,MD

## 2021-05-03 NOTE — Addendum Note (Signed)
Addended by: Berlinda Last on: 05/03/2021 09:59 AM   Modules accepted: Orders

## 2021-05-10 ENCOUNTER — Ambulatory Visit (INDEPENDENT_AMBULATORY_CARE_PROVIDER_SITE_OTHER): Payer: 59

## 2021-05-10 DIAGNOSIS — I639 Cerebral infarction, unspecified: Secondary | ICD-10-CM | POA: Diagnosis not present

## 2021-05-10 LAB — CUP PACEART REMOTE DEVICE CHECK
Date Time Interrogation Session: 20220828011355
Implantable Pulse Generator Implant Date: 20200707

## 2021-05-23 NOTE — Progress Notes (Signed)
Carelink Summary Report / Loop Recorder 

## 2021-05-24 DIAGNOSIS — M62838 Other muscle spasm: Secondary | ICD-10-CM | POA: Diagnosis not present

## 2021-05-24 DIAGNOSIS — M6289 Other specified disorders of muscle: Secondary | ICD-10-CM | POA: Diagnosis not present

## 2021-05-24 DIAGNOSIS — M6281 Muscle weakness (generalized): Secondary | ICD-10-CM | POA: Diagnosis not present

## 2021-06-09 ENCOUNTER — Ambulatory Visit: Payer: 59 | Admitting: Obstetrics and Gynecology

## 2021-06-13 ENCOUNTER — Ambulatory Visit (INDEPENDENT_AMBULATORY_CARE_PROVIDER_SITE_OTHER): Payer: 59

## 2021-06-13 DIAGNOSIS — I639 Cerebral infarction, unspecified: Secondary | ICD-10-CM | POA: Diagnosis not present

## 2021-06-13 LAB — CUP PACEART REMOTE DEVICE CHECK
Date Time Interrogation Session: 20220930011608
Implantable Pulse Generator Implant Date: 20200707

## 2021-06-20 NOTE — Progress Notes (Signed)
Carelink Summary Report / Loop Recorder 

## 2021-06-21 ENCOUNTER — Other Ambulatory Visit: Payer: Self-pay | Admitting: Obstetrics and Gynecology

## 2021-06-21 DIAGNOSIS — Z1231 Encounter for screening mammogram for malignant neoplasm of breast: Secondary | ICD-10-CM

## 2021-07-12 DIAGNOSIS — J111 Influenza due to unidentified influenza virus with other respiratory manifestations: Secondary | ICD-10-CM | POA: Diagnosis not present

## 2021-07-12 DIAGNOSIS — J069 Acute upper respiratory infection, unspecified: Secondary | ICD-10-CM | POA: Diagnosis not present

## 2021-07-12 DIAGNOSIS — Z299 Encounter for prophylactic measures, unspecified: Secondary | ICD-10-CM | POA: Diagnosis not present

## 2021-07-12 DIAGNOSIS — E2839 Other primary ovarian failure: Secondary | ICD-10-CM | POA: Diagnosis not present

## 2021-07-12 DIAGNOSIS — R5383 Other fatigue: Secondary | ICD-10-CM | POA: Diagnosis not present

## 2021-07-13 ENCOUNTER — Other Ambulatory Visit (HOSPITAL_COMMUNITY): Payer: Self-pay

## 2021-07-13 LAB — CUP PACEART REMOTE DEVICE CHECK
Date Time Interrogation Session: 20221102012116
Implantable Pulse Generator Implant Date: 20200707

## 2021-07-18 ENCOUNTER — Ambulatory Visit (INDEPENDENT_AMBULATORY_CARE_PROVIDER_SITE_OTHER): Payer: 59

## 2021-07-18 DIAGNOSIS — I639 Cerebral infarction, unspecified: Secondary | ICD-10-CM

## 2021-07-21 NOTE — Progress Notes (Signed)
Carelink Summary Report / Loop Recorder 

## 2021-07-22 ENCOUNTER — Other Ambulatory Visit: Payer: Self-pay

## 2021-07-22 ENCOUNTER — Ambulatory Visit
Admission: RE | Admit: 2021-07-22 | Discharge: 2021-07-22 | Disposition: A | Discharge status: Home / Self Care | Payer: 59 | Source: Ambulatory Visit | Attending: Obstetrics and Gynecology | Admitting: Obstetrics and Gynecology

## 2021-07-22 DIAGNOSIS — Z1231 Encounter for screening mammogram for malignant neoplasm of breast: Secondary | ICD-10-CM

## 2021-08-17 LAB — CUP PACEART REMOTE DEVICE CHECK
Date Time Interrogation Session: 20221205002625
Implantable Pulse Generator Implant Date: 20200707

## 2021-08-22 ENCOUNTER — Ambulatory Visit (INDEPENDENT_AMBULATORY_CARE_PROVIDER_SITE_OTHER): Payer: 59

## 2021-08-22 DIAGNOSIS — I639 Cerebral infarction, unspecified: Secondary | ICD-10-CM

## 2021-08-29 NOTE — Progress Notes (Signed)
Carelink Summary Report / Loop Recorder 

## 2021-09-26 ENCOUNTER — Ambulatory Visit (INDEPENDENT_AMBULATORY_CARE_PROVIDER_SITE_OTHER): Payer: 59

## 2021-09-26 DIAGNOSIS — I639 Cerebral infarction, unspecified: Secondary | ICD-10-CM

## 2021-09-26 LAB — CUP PACEART REMOTE DEVICE CHECK
Date Time Interrogation Session: 20230115231212
Implantable Pulse Generator Implant Date: 20200707

## 2021-09-27 ENCOUNTER — Encounter (INDEPENDENT_AMBULATORY_CARE_PROVIDER_SITE_OTHER): Payer: Self-pay | Admitting: *Deleted

## 2021-09-27 ENCOUNTER — Telehealth (INDEPENDENT_AMBULATORY_CARE_PROVIDER_SITE_OTHER): Payer: Self-pay | Admitting: *Deleted

## 2021-09-27 ENCOUNTER — Telehealth (INDEPENDENT_AMBULATORY_CARE_PROVIDER_SITE_OTHER): Payer: Self-pay | Admitting: Gastroenterology

## 2021-09-27 MED ORDER — NA SULFATE-K SULFATE-MG SULF 17.5-3.13-1.6 GM/177ML PO SOLN: 1.0000 | Freq: Once | ORAL | 0 refills | Status: AC

## 2021-09-27 NOTE — Telephone Encounter (Addendum)
Patient needs suprep 

## 2021-09-27 NOTE — Telephone Encounter (Signed)
Spoke to patient, TCS sch'd 11/23/21, instructions mailed

## 2021-09-27 NOTE — Telephone Encounter (Signed)
Medication sent to pharmacy  

## 2021-09-27 NOTE — Telephone Encounter (Signed)
Pt calling to check on a referral to get her colonoscopy done. Please call [phone]

## 2021-09-30 ENCOUNTER — Other Ambulatory Visit (INDEPENDENT_AMBULATORY_CARE_PROVIDER_SITE_OTHER): Payer: Self-pay

## 2021-09-30 DIAGNOSIS — Z1211 Encounter for screening for malignant neoplasm of colon: Secondary | ICD-10-CM

## 2021-10-05 NOTE — Progress Notes (Signed)
Carelink Summary Report / Loop Recorder 

## 2021-10-10 ENCOUNTER — Other Ambulatory Visit (HOSPITAL_COMMUNITY): Payer: Self-pay

## 2021-10-13 ENCOUNTER — Telehealth: Payer: Self-pay

## 2021-10-13 NOTE — Telephone Encounter (Signed)
Alert ILR report received. Battery status OK. Normal device function. No new symptom, brady, or pause episodes. No new AF episodes. There was one tachycardia episode detected that averaged 194 bpm for 2 minutes and 34 seconds.  Monthly summary reports and ROV/PRN   Successful telephone encounter to patient to follow up on s/s of SVT. Patient states she was sitting in her prayer group when she developed palpitations which caused her anxiety. States "I leaned into it" and symptoms subsided after about 2 min. This is the first time she has experienced symptomatic SVT. No cardiovascular medications on file. Patient is advised on valsalva techniques such as bearing down if she experiences these symptoms again. She is advised to call 911 if symptoms do not quickly subside. Anticipate watchful waiting. Routing to Dr. Lovena Le as an [place] since this is first episode.

## 2021-10-27 ENCOUNTER — Telehealth (INDEPENDENT_AMBULATORY_CARE_PROVIDER_SITE_OTHER): Payer: Self-pay | Admitting: *Deleted

## 2021-10-27 NOTE — Telephone Encounter (Signed)
Referring MD/PCP: vyas  Procedure: tcs  Reason/Indication:  screening  Has patient had this procedure before?  no  If so, when, by whom and where?    Is there a family history of colon cancer?  no  Who?  What age when diagnosed?    Is patient diabetic? If yes, Type 1 or Type 2   no      Does patient have prosthetic heart valve or mechanical valve?  no  Do you have a pacemaker/defibrillator?  no  Has patient ever had endocarditis/atrial fibrillation? no  Does patient use oxygen? no  Has patient had joint replacement within last 12 months?  no  Is patient constipated or do they take laxatives? no  Does patient have a history of alcohol/drug use?  no  Have you had a stroke/heart attack last 6 mths? no  Do you take medicine for weight loss?  no  For female patients,: have you had a hysterectomy                       are you post menopausal                       do you still have your menstrual cycle no  Is patient on blood thinner such as Coumadin, Plavix and/or Aspirin? yes  Medications: asa 81 mg daily, effexor 37.5 mg daily, mvi daily, oxybutynin 10 mg daily, vit d3 5000 units daily, vit c daily, glucosamine/chondroitin bid  Allergies: sulfur, statins, darvocet  Medication Adjustment per Dr Rehman/Dr Jenetta Downer asa 2 days  Procedure date & time: 11/23/21

## 2021-10-31 ENCOUNTER — Ambulatory Visit (INDEPENDENT_AMBULATORY_CARE_PROVIDER_SITE_OTHER): Payer: 59

## 2021-10-31 DIAGNOSIS — I639 Cerebral infarction, unspecified: Secondary | ICD-10-CM | POA: Diagnosis not present

## 2021-10-31 LAB — CUP PACEART REMOTE DEVICE CHECK
Date Time Interrogation Session: 20230219231238
Implantable Pulse Generator Implant Date: 20200707

## 2021-11-03 NOTE — Progress Notes (Signed)
Carelink Summary Report / Loop Recorder 

## 2021-11-22 ENCOUNTER — Other Ambulatory Visit (INDEPENDENT_AMBULATORY_CARE_PROVIDER_SITE_OTHER): Payer: Self-pay

## 2021-11-22 ENCOUNTER — Other Ambulatory Visit (HOSPITAL_COMMUNITY)
Admission: RE | Admit: 2021-11-22 | Discharge: 2021-11-22 | Disposition: A | Discharge status: Home / Self Care | Payer: 59 | Source: Ambulatory Visit | Attending: Gastroenterology | Admitting: Gastroenterology

## 2021-11-22 ENCOUNTER — Telehealth: Payer: Self-pay

## 2021-11-22 DIAGNOSIS — Z1211 Encounter for screening for malignant neoplasm of colon: Secondary | ICD-10-CM

## 2021-11-22 DIAGNOSIS — Z01812 Encounter for preprocedural laboratory examination: Secondary | ICD-10-CM

## 2021-11-22 LAB — PREGNANCY, URINE: Preg Test, Ur: NEGATIVE

## 2021-11-22 NOTE — Telephone Encounter (Signed)
Patient called to inquire date her IUD was removed. Advised 12/07/20. ?

## 2021-11-23 ENCOUNTER — Encounter (HOSPITAL_COMMUNITY)
Admission: RE | Disposition: A | Discharge status: Home / Self Care | Payer: Self-pay | Source: Home / Self Care | Attending: Gastroenterology

## 2021-11-23 ENCOUNTER — Ambulatory Visit (HOSPITAL_COMMUNITY)
Admission: RE | Admit: 2021-11-23 | Discharge: 2021-11-23 | Disposition: A | Discharge status: Home / Self Care | Payer: 59 | Attending: Gastroenterology | Admitting: Gastroenterology

## 2021-11-23 ENCOUNTER — Encounter (HOSPITAL_COMMUNITY): Payer: Self-pay | Admitting: Gastroenterology

## 2021-11-23 ENCOUNTER — Ambulatory Visit (HOSPITAL_BASED_OUTPATIENT_CLINIC_OR_DEPARTMENT_OTHER): Payer: 59 | Admitting: Certified Registered Nurse Anesthetist

## 2021-11-23 ENCOUNTER — Other Ambulatory Visit: Payer: Self-pay

## 2021-11-23 ENCOUNTER — Ambulatory Visit (HOSPITAL_COMMUNITY): Payer: 59 | Admitting: Certified Registered Nurse Anesthetist

## 2021-11-23 DIAGNOSIS — Z1211 Encounter for screening for malignant neoplasm of colon: Secondary | ICD-10-CM | POA: Insufficient documentation

## 2021-11-23 DIAGNOSIS — N879 Dysplasia of cervix uteri, unspecified: Secondary | ICD-10-CM | POA: Insufficient documentation

## 2021-11-23 DIAGNOSIS — Z8673 Personal history of transient ischemic attack (TIA), and cerebral infarction without residual deficits: Secondary | ICD-10-CM | POA: Diagnosis not present

## 2021-11-23 DIAGNOSIS — Z856 Personal history of leukemia: Secondary | ICD-10-CM | POA: Insufficient documentation

## 2021-11-23 DIAGNOSIS — M8580 Other specified disorders of bone density and structure, unspecified site: Secondary | ICD-10-CM | POA: Diagnosis not present

## 2021-11-23 HISTORY — PX: COLONOSCOPY WITH PROPOFOL: SHX5780

## 2021-11-23 LAB — HM COLONOSCOPY

## 2021-11-23 SURGERY — COLONOSCOPY WITH PROPOFOL
Anesthesia: General

## 2021-11-23 MED ORDER — SODIUM CHLORIDE (PF) 0.9 % IJ SOLN
INTRAMUSCULAR | Status: AC
  Filled 2021-11-23: qty 10

## 2021-11-23 MED ORDER — GLYCOPYRROLATE PF 0.2 MG/ML IJ SOSY
PREFILLED_SYRINGE | INTRAMUSCULAR | Status: AC
  Filled 2021-11-23: qty 1

## 2021-11-23 MED ORDER — PROPOFOL 10 MG/ML IV BOLUS
INTRAVENOUS | Status: DC | PRN
  Administered 2021-11-23: 60 mg via INTRAVENOUS

## 2021-11-23 MED ORDER — EPHEDRINE SULFATE-NACL 50-0.9 MG/10ML-% IV SOSY
PREFILLED_SYRINGE | INTRAVENOUS | Status: DC | PRN
  Administered 2021-11-23: 2.5 mg via INTRAVENOUS
  Administered 2021-11-23 (×2): 5 mg via INTRAVENOUS

## 2021-11-23 MED ORDER — PHENYLEPHRINE HCL (PRESSORS) 10 MG/ML IV SOLN
INTRAVENOUS | Status: AC
Start: 2021-11-23 — End: ?
  Filled 2021-11-23: qty 1

## 2021-11-23 MED ORDER — PROPOFOL 500 MG/50ML IV EMUL
INTRAVENOUS | Status: DC | PRN
  Administered 2021-11-23: 150 ug/kg/min via INTRAVENOUS

## 2021-11-23 MED ORDER — LIDOCAINE HCL (PF) 2 % IJ SOLN
INTRAMUSCULAR | Status: AC
  Filled 2021-11-23: qty 5

## 2021-11-23 MED ORDER — LACTATED RINGERS IV SOLN: INTRAVENOUS | Status: DC

## 2021-11-23 MED ORDER — EPHEDRINE 5 MG/ML INJ
INTRAVENOUS | Status: AC
  Filled 2021-11-23: qty 5

## 2021-11-23 NOTE — Transfer of Care (Signed)
Immediate Anesthesia Transfer of Care Note ? ?Patient: Judy Walter ? ?Procedure(s) Performed: COLONOSCOPY WITH PROPOFOL ? ?Patient Location: PACU ? ?Anesthesia Type:General ? ?Level of Consciousness: awake, alert  and oriented ? ?Airway & Oxygen Therapy: Patient Spontanous Breathing ? ?Post-op Assessment: Report given to RN, Post -op Vital signs reviewed and stable, Patient moving all extremities X 4 and Patient able to stick tongue midline ? ?Post vital signs: Reviewed ? ?Last Vitals:  ?Vitals Value Taken Time  ?BP 127/85   ?Temp 36.6   ?Pulse 55   ?Resp 18   ?SpO2 100   ? ? ?Last Pain:  ?Vitals:  ? 11/23/21 0739  ?TempSrc:   ?PainSc: 0-No pain  ?   ? ?Patients Stated Pain Goal: 6 (11/23/21 7615) ? ?Complications: No notable events documented. ?

## 2021-11-23 NOTE — Anesthesia Postprocedure Evaluation (Signed)
Anesthesia Post Note ? ?Patient: Judy Walter ? ?Procedure(s) Performed: COLONOSCOPY WITH PROPOFOL ? ?Patient location during evaluation: Phase II ?Anesthesia Type: General ?Level of consciousness: awake ?Pain management: pain level controlled ?Vital Signs Assessment: post-procedure vital signs reviewed and stable ?Respiratory status: spontaneous breathing and respiratory function stable ?Cardiovascular status: blood pressure returned to baseline and stable ?Postop Assessment: no headache and no apparent nausea or vomiting ?Anesthetic complications: no ?Comments: Late entry ? ? ?No notable events documented. ? ? ?Last Vitals:  ?Vitals:  ? 11/23/21 0642 11/23/21 0814  ?BP: 110/77 127/85  ?Resp: 16 14  ?Temp: 36.6 ?C 36.6 ?C  ?SpO2: 100% 100%  ?  ?Last Pain:  ?Vitals:  ? 11/23/21 0814  ?TempSrc: Axillary  ?PainSc:   ? ? ?  ?  ?  ?  ?  ?  ? ?Louann Sjogren ? ? ? ? ?

## 2021-11-23 NOTE — Anesthesia Preprocedure Evaluation (Signed)
Anesthesia Evaluation  ?Patient identified by MRN, date of birth, ID band ?Patient awake ? ? ? ?Reviewed: ?Allergy & Precautions, H&P , NPO status , Patient's Chart, lab work & pertinent test results, reviewed documented beta blocker date and time  ? ?Airway ?Mallampati: II ? ?TM Distance: >3 FB ?Neck ROM: full ? ? ? Dental ?no notable dental hx. ? ?  ?Pulmonary ?neg pulmonary ROS,  ?  ?Pulmonary exam normal ?breath sounds clear to auscultation ? ? ? ? ? ? Cardiovascular ?Exercise Tolerance: Good ?negative cardio ROS ? ? ?Rhythm:regular Rate:Normal ? ? ?  ?Neuro/Psych ? Headaches, CVA negative psych ROS  ? GI/Hepatic ?negative GI ROS, Neg liver ROS,   ?Endo/Other  ?negative endocrine ROS ? Renal/GU ?negative Renal ROS  ?negative genitourinary ?  ?Musculoskeletal ? ? Abdominal ?  ?Peds ? Hematology ?negative hematology ROS ?(+)   ?Anesthesia Other Findings ? ? Reproductive/Obstetrics ?negative OB ROS ? ?  ? ? ? ? ? ? ? ? ? ? ? ? ? ?  ?  ? ? ? ? ? ? ? ? ?Anesthesia Physical ?Anesthesia Plan ? ?ASA: 3 ? ?Anesthesia Plan: General  ? ?Post-op Pain Management:   ? ?Induction:  ? ?PONV Risk Score and Plan: Propofol infusion ? ?Airway Management Planned:  ? ?Additional Equipment:  ? ?Intra-op Plan:  ? ?Post-operative Plan:  ? ?Informed Consent: I have reviewed the patients History and Physical, chart, labs and discussed the procedure including the risks, benefits and alternatives for the proposed anesthesia with the patient or authorized representative who has indicated his/her understanding and acceptance.  ? ? ? ?Dental Advisory Given ? ?Plan Discussed with: CRNA ? ?Anesthesia Plan Comments:   ? ? ? ? ? ? ?Anesthesia Quick Evaluation ? ?

## 2021-11-23 NOTE — H&P (Signed)
Judy Walter is an 51 y.o. female.   ?Chief Complaint: screening colonoscopy ?HPI: 51 y/o with PMH Leukemia, osteopenia, stroke, cervical dysplasia, coming for screening colonoscopy. The patient has never had a colonoscopy in the past.  The patient denies having any complaints such as melena, hematochezia, abdominal pain or distention, change in her bowel movement consistency or frequency, no changes in her weight recently.  No family history of colorectal cancer. ? ? ?Past Medical History:  ?Diagnosis Date  ? Abnormal Pap smear of cervix 06/2015  ? ASCUS   ? Cervical dysplasia 2017  ? colposcopic biopsy  ? Elevated cholesterol   ? Leukemia ([place])   ? age 51, s/p chemotherapy  ? Migraines   ? Osteopenia 08/2014  ? Personal history of venous thrombosis and embolism   ? Stroke ([place]) 02/06/2019  ? acute left basal ganglia  ? Vitamin D deficiency   ? ? ?Past Surgical History:  ?Procedure Laterality Date  ? C-sections  2002, 2008  ? DILATION AND CURETTAGE OF UTERUS  1997  ? wisdom teeth extracted    ? ? ?Family History  ?Problem Relation Age of Onset  ? Stroke Mother   ? Coronary artery disease Father   ? Hypertension Father   ? Hemochromatosis Father   ? Cancer Father   ?     prostate  ? Heart attack Father   ? Breast cancer Maternal Grandmother   ?     73s  ? Osteoporosis Maternal Grandmother   ? Alzheimer's disease Maternal Grandmother   ? Coronary artery disease Paternal Grandmother   ? Diabetes Paternal Grandmother   ? Hypertension Paternal Grandmother   ? ?Social History:  reports that she has never smoked. She has never used smokeless tobacco. She reports current alcohol use. She reports that she does not use drugs. ? ?Allergies:  ?Allergies  ?Allergen Reactions  ? Crestor [Rosuvastatin]   ?  Muscle aches and pains  ? Lipitor [Atorvastatin]   ?  Muscle pain  ? Propoxyphene   ?  Darvocet - hallucinations   ? Sulfonamide Derivatives   ?  fever  ? ? ?Medications Prior to Admission  ?Medication Sig Dispense Refill   ? acetaminophen (TYLENOL) 500 MG tablet Take 1,000 mg by mouth every 6 (six) hours as needed for moderate pain.    ? aspirin EC 81 MG EC tablet Take 1 tablet (81 mg total) by mouth daily. 90 tablet 0  ? fluticasone (FLONASE) 50 MCG/ACT nasal spray Place 1 spray into both nostrils daily as needed for allergies or rhinitis.    ? Misc Natural Products (OSTEO BI-FLEX JOINT SHIELD PO) Take 2 tablets by mouth daily.    ? Multiple Vitamins-Minerals (MULTIVITAMIN ADULTS PO) Take 1 tablet by mouth daily.    ? Olopatadine HCl (PATADAY) 0.7 % SOLN Place 1 drop into both eyes daily.    ? SUPER B COMPLEX/C PO Take 1 capsule by mouth daily.    ? venlafaxine XR (EFFEXOR-XR) 37.5 MG 24 hr capsule Take 1 capsule by mouth daily. 90 capsule 3  ? VITAMIN D-VITAMIN K PO Take 1 capsule by mouth daily.    ? oxybutynin (DITROPAN XL) 10 MG 24 hr tablet Take 1 tablet (10 mg total) by mouth at bedtime. (Patient not taking: Reported on 11/18/2021) 90 tablet 3  ? ? ?Results for orders placed or performed during the hospital encounter of 11/22/21 (from the past 48 hour(s))  ?Pregnancy, urine     Status: None  ? Collection  Time: 11/22/21  3:01 PM  ?Result Value Ref Range  ? Preg Test, Ur NEGATIVE NEGATIVE  ?  Comment:        ?THE SENSITIVITY OF THIS ?METHODOLOGY IS >20 mIU/mL. ?Performed at [facility], [address] [place], [place] [address] ?  ? ?No results found. ? ?Review of Systems  ?Constitutional: Negative.   ?HENT: Negative.    ?Eyes: Negative.   ?Respiratory: Negative.    ?Cardiovascular: Negative.   ?Gastrointestinal: Negative.   ?Endocrine: Negative.   ?Genitourinary: Negative.   ?Musculoskeletal: Negative.   ?Skin: Negative.   ?Allergic/Immunologic: Negative.   ?Neurological: Negative.   ?Hematological: Negative.   ?Psychiatric/Behavioral: Negative.    ? ?Blood pressure 110/77, temperature 97.9 ?F (36.6 ?C), temperature source Oral, resp. rate 16, height 5' 4.5" (1.638 m), weight 66.7 kg, SpO2 100 %. ?Physical Exam  ?GENERAL: The  patient is AO x3, in no acute distress. ?HEENT: Head is normocephalic and atraumatic. EOMI are intact. Mouth is well hydrated and without lesions. ?NECK: Supple. No masses ?LUNGS: Clear to auscultation. No presence of rhonchi/wheezing/rales. Adequate chest expansion ?HEART: RRR, normal s1 and s2. ?ABDOMEN: Soft, nontender, no guarding, no peritoneal signs, and nondistended. BS +. No masses. ?EXTREMITIES: Without any cyanosis, clubbing, rash, lesions or edema. ?NEUROLOGIC: AOx3, no focal motor deficit. ?SKIN: no jaundice, no rashes ? ?Assessment/Plan ?51 y/o with PMH Leukemia, osteopenia, stroke, cervical dysplasia, coming for screening colonoscopy. The patient is at average risk for colorectal cancer.  We will proceed with colonoscopy today. ? ? ?Harvel Quale, MD ?11/23/2021, 7:38 AM ? ? ? ?

## 2021-11-23 NOTE — Op Note (Signed)
[facility] ?Patient Name: Judy Walter ?Procedure Date: 11/23/2021 7:14 AM ?MRN: [id number] ?Date of Birth: [birth year] ?Attending MD: Maylon Peppers ,  ?CSN: [id number] ?Age: 51 ?Admit Type: Outpatient ?Procedure:                Colonoscopy ?Indications:              Screening for colorectal malignant neoplasm ?Providers:                Maylon Peppers, [place] Page, [place] Cyndi Bender  ?                          Tech, Technician ?Referring MD:              ?Medicines:                Monitored Anesthesia Care ?Complications:            No immediate complications. ?Estimated Blood Loss:     Estimated blood loss: none. ?Procedure:                Pre-Anesthesia Assessment: ?                          - Prior to the procedure, a History and Physical  ?                          was performed, and patient medications, allergies  ?                          and sensitivities were reviewed. The patient's  ?                          tolerance of previous anesthesia was reviewed. ?                          - The risks and benefits of the procedure and the  ?                          sedation options and risks were discussed with the  ?                          patient. All questions were answered and informed  ?                          consent was obtained. ?                          After obtaining informed consent, the colonoscope  ?                          was passed under direct vision. Throughout the  ?                          procedure, the patient's blood pressure, pulse, and  ?                          oxygen saturations were monitored continuously. The  ?  [id number] ([id number]) was introduced through the  ?                          anus and advanced to the the cecum, identified by  ?                          appendiceal orifice and ileocecal valve. The  ?                          colonoscopy was technically difficult and complex  ?                          due to significant looping.  Successful completion  ?                          of the procedure was aided by applying abdominal  ?                          pressure. The quality of the bowel preparation was  ?                          adequate to identify polyps 6 mm and larger in size. ?Scope In: 7:43:17 AM ?Scope Out: 8:10:29 AM ?Scope Withdrawal Time: 0 hours 13 minutes 52 seconds  ?Total Procedure Duration: 0 hours 27 minutes 12 seconds  ?Findings: ?     The perianal and digital rectal examinations were normal. ?     The ascending colon revealed significantly excessive looping. Scope  ?     could be advanced after doing pressure in the RUQ and LLQ. ?     The retroflexed view of the distal rectum and anal verge was normal and  ?     showed no anal or rectal abnormalities. ?Impression:               - There was significant looping of the colon. ?                          - The distal rectum and anal verge are normal on  ?                          retroflexion view. ?                          - No specimens collected. ?Moderate Sedation: ?     Per Anesthesia Care ?Recommendation:           - Discharge patient to home (ambulatory). ?                          - Resume previous diet. ?                          - Repeat colonoscopy in 10 years for screening  ?                          purposes. ?Procedure Code(s):        --- Professional --- ?  F6812, Colorectal cancer screening; colonoscopy on  ?                          individual not meeting criteria for high risk ?Diagnosis Code(s):        --- Professional --- ?                          Z12.11, Encounter for screening for malignant  ?                          neoplasm of colon ?CPT copyright 2019 American Medical Association. All rights reserved. ?The codes documented in this report are preliminary and upon coder review may  ?be revised to meet current compliance requirements. ?Maylon Peppers, MD ?Maylon Peppers,  ?11/23/2021 8:16:20 AM ?This report has been signed  electronically. ?Number of Addenda: 0 ?

## 2021-11-23 NOTE — Discharge Instructions (Addendum)
You are being discharged to home.  Resume your previous diet.  Your physician has recommended a repeat colonoscopy in 10 years for screening purposes.  

## 2021-11-24 ENCOUNTER — Encounter (INDEPENDENT_AMBULATORY_CARE_PROVIDER_SITE_OTHER): Payer: Self-pay | Admitting: *Deleted

## 2021-11-28 ENCOUNTER — Encounter (HOSPITAL_COMMUNITY): Payer: Self-pay | Admitting: Gastroenterology

## 2021-12-05 ENCOUNTER — Ambulatory Visit (INDEPENDENT_AMBULATORY_CARE_PROVIDER_SITE_OTHER): Payer: 59

## 2021-12-05 DIAGNOSIS — I639 Cerebral infarction, unspecified: Secondary | ICD-10-CM | POA: Diagnosis not present

## 2021-12-06 LAB — CUP PACEART REMOTE DEVICE CHECK
Date Time Interrogation Session: 20230326230431
Implantable Pulse Generator Implant Date: 20200707

## 2021-12-14 NOTE — Progress Notes (Signed)
Carelink Summary Report / Loop Recorder 

## 2022-01-07 LAB — CUP PACEART REMOTE DEVICE CHECK
Date Time Interrogation Session: 20230428230826
Implantable Pulse Generator Implant Date: 20200707

## 2022-01-09 ENCOUNTER — Ambulatory Visit (INDEPENDENT_AMBULATORY_CARE_PROVIDER_SITE_OTHER): Payer: 59

## 2022-01-09 DIAGNOSIS — I639 Cerebral infarction, unspecified: Secondary | ICD-10-CM | POA: Diagnosis not present

## 2022-01-11 ENCOUNTER — Other Ambulatory Visit (HOSPITAL_COMMUNITY): Payer: Self-pay

## 2022-01-23 NOTE — Progress Notes (Signed)
Carelink Summary Report / Loop Recorder 

## 2022-02-12 LAB — CUP PACEART REMOTE DEVICE CHECK
Date Time Interrogation Session: 20230531232302
Implantable Pulse Generator Implant Date: 20200707

## 2022-02-13 ENCOUNTER — Ambulatory Visit (INDEPENDENT_AMBULATORY_CARE_PROVIDER_SITE_OTHER): Payer: 59

## 2022-02-13 DIAGNOSIS — I639 Cerebral infarction, unspecified: Secondary | ICD-10-CM

## 2022-02-20 DIAGNOSIS — L309 Dermatitis, unspecified: Secondary | ICD-10-CM | POA: Diagnosis not present

## 2022-02-20 DIAGNOSIS — Z299 Encounter for prophylactic measures, unspecified: Secondary | ICD-10-CM | POA: Diagnosis not present

## 2022-02-20 DIAGNOSIS — Z789 Other specified health status: Secondary | ICD-10-CM | POA: Diagnosis not present

## 2022-03-01 NOTE — Progress Notes (Signed)
Carelink Summary Report / Loop Recorder 

## 2022-03-07 DIAGNOSIS — H52223 Regular astigmatism, bilateral: Secondary | ICD-10-CM | POA: Diagnosis not present

## 2022-03-07 DIAGNOSIS — H524 Presbyopia: Secondary | ICD-10-CM | POA: Diagnosis not present

## 2022-03-18 LAB — CUP PACEART REMOTE DEVICE CHECK
Date Time Interrogation Session: 20230703232014
Implantable Pulse Generator Implant Date: 20200707

## 2022-03-20 ENCOUNTER — Ambulatory Visit (INDEPENDENT_AMBULATORY_CARE_PROVIDER_SITE_OTHER): Payer: 59

## 2022-03-20 DIAGNOSIS — I639 Cerebral infarction, unspecified: Secondary | ICD-10-CM

## 2022-04-13 ENCOUNTER — Other Ambulatory Visit: Payer: Self-pay | Admitting: Obstetrics and Gynecology

## 2022-04-13 ENCOUNTER — Other Ambulatory Visit (HOSPITAL_COMMUNITY): Payer: Self-pay

## 2022-04-13 NOTE — Telephone Encounter (Signed)
Last annual exam was 03/02/22 No future exam scheduled  Mammogram 2022

## 2022-04-14 ENCOUNTER — Other Ambulatory Visit (HOSPITAL_COMMUNITY): Payer: Self-pay

## 2022-04-14 MED ORDER — VENLAFAXINE HCL ER 37.5 MG PO CP24
37.5000 mg | ORAL_CAPSULE | Freq: Every day | ORAL | 0 refills | Status: DC
Start: 2022-04-14 — End: 2022-05-19
  Filled 2022-04-14: qty 30, 30d supply, fill #0

## 2022-04-17 ENCOUNTER — Other Ambulatory Visit (HOSPITAL_COMMUNITY): Payer: Self-pay

## 2022-04-18 DIAGNOSIS — E785 Hyperlipidemia, unspecified: Secondary | ICD-10-CM | POA: Diagnosis not present

## 2022-04-18 DIAGNOSIS — Z6825 Body mass index (BMI) 25.0-25.9, adult: Secondary | ICD-10-CM | POA: Diagnosis not present

## 2022-04-18 DIAGNOSIS — Z0000 Encounter for general adult medical examination without abnormal findings: Secondary | ICD-10-CM | POA: Diagnosis not present

## 2022-04-18 DIAGNOSIS — Z299 Encounter for prophylactic measures, unspecified: Secondary | ICD-10-CM | POA: Diagnosis not present

## 2022-04-18 DIAGNOSIS — Z789 Other specified health status: Secondary | ICD-10-CM | POA: Diagnosis not present

## 2022-04-18 DIAGNOSIS — R5383 Other fatigue: Secondary | ICD-10-CM | POA: Diagnosis not present

## 2022-04-18 DIAGNOSIS — Z1331 Encounter for screening for depression: Secondary | ICD-10-CM | POA: Diagnosis not present

## 2022-04-18 DIAGNOSIS — Z79899 Other long term (current) drug therapy: Secondary | ICD-10-CM | POA: Diagnosis not present

## 2022-04-19 LAB — CUP PACEART REMOTE DEVICE CHECK
Date Time Interrogation Session: 20230805232334
Implantable Pulse Generator Implant Date: 20200707

## 2022-04-20 NOTE — Progress Notes (Signed)
Carelink Summary Report / Loop Recorder 

## 2022-04-21 DIAGNOSIS — R5383 Other fatigue: Secondary | ICD-10-CM | POA: Diagnosis not present

## 2022-04-21 DIAGNOSIS — Z0000 Encounter for general adult medical examination without abnormal findings: Secondary | ICD-10-CM | POA: Diagnosis not present

## 2022-04-21 DIAGNOSIS — E785 Hyperlipidemia, unspecified: Secondary | ICD-10-CM | POA: Diagnosis not present

## 2022-04-21 DIAGNOSIS — E559 Vitamin D deficiency, unspecified: Secondary | ICD-10-CM | POA: Diagnosis not present

## 2022-04-21 DIAGNOSIS — Z79899 Other long term (current) drug therapy: Secondary | ICD-10-CM | POA: Diagnosis not present

## 2022-04-24 ENCOUNTER — Ambulatory Visit (INDEPENDENT_AMBULATORY_CARE_PROVIDER_SITE_OTHER): Payer: 59

## 2022-04-24 DIAGNOSIS — I639 Cerebral infarction, unspecified: Secondary | ICD-10-CM | POA: Diagnosis not present

## 2022-05-01 ENCOUNTER — Telehealth: Payer: Self-pay | Admitting: Internal Medicine

## 2022-05-01 NOTE — Telephone Encounter (Signed)
Call sent straight to triage. Patient complaining of an episode that lasted for about 2 to 3 minutes, not sure if it was a fib. Patient stated her HR went from 35 to 190. This happen 45 minutes ago. Will send message to [facility] to see if they can pull any information from patient's Loop Recorder. 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 Will consult Dr. Lovena Le for advisement.

## 2022-05-01 NOTE — Telephone Encounter (Signed)
STAT if HR is under 50 or over 120 (normal HR is 60-100 beats per minute)  What is your heart rate? 35 went to 190  Do you have a log of your heart rate readings (document readings)?    Do you have any other symptoms? Patient is feeling tired and dizzy   Patient believes she had an real bad episode

## 2022-05-01 NOTE — Telephone Encounter (Signed)
Patient has ILR 1 and is not at home. She will send a remote transmission tonight. Instructions sent to patient via mychart. Dr. Lovena Le aware and is fine for patient to continue working today, will review with Dr. Lovena Le after results are viewed. Patient given ED precautions with verbal understanding.

## 2022-05-02 NOTE — Telephone Encounter (Signed)
[employer] Alert  ILR alert for tachy episode, NCT/SVT with rates 188-207 bpm lasting 3 min on 05/01/22 @ 1424. Routing for further review.   Routing to Dr. Lovena Le for review.

## 2022-05-02 NOTE — Telephone Encounter (Signed)
See my chart message

## 2022-05-02 NOTE — Telephone Encounter (Signed)
Patient called to check on status of call. Judy Walter wants to know if Dr. Lovena Le has had a chance to review yesterday's episode.

## 2022-05-02 NOTE — Telephone Encounter (Signed)
I spoke with the patient to let her know that we got her transmission and that Dr. Lovena Le will review it, then the nurse will give her a call back.

## 2022-05-04 NOTE — Telephone Encounter (Signed)
Reviewed EGM with Dr. Lovena Le and reports patient needs f/u appointment with him to discuss rate control options. Patient has yearly apt 05/23/22 with Dr. Lovena Le. Patient aware and discussed location, date and time with patient. Advised patient if she has another episode to please call and let us know. Patient voiced understanding.

## 2022-05-08 NOTE — Telephone Encounter (Signed)
Reviewed EGM's with Dr. Lovena Le 05/04/22, Dr. Lovena Le recommended patient f/u in-clinic 05/23/22. Apt. Made for yearly. Patient aware and agreeable.

## 2022-05-19 ENCOUNTER — Other Ambulatory Visit (HOSPITAL_COMMUNITY): Payer: Self-pay

## 2022-05-19 ENCOUNTER — Other Ambulatory Visit: Payer: Self-pay | Admitting: Obstetrics and Gynecology

## 2022-05-19 MED ORDER — VENLAFAXINE HCL ER 37.5 MG PO CP24
37.5000 mg | ORAL_CAPSULE | Freq: Every day | ORAL | 0 refills | Status: DC
Start: 2022-05-19 — End: 2022-06-21
  Filled 2022-05-19: qty 30, 30d supply, fill #0

## 2022-05-19 NOTE — Telephone Encounter (Signed)
Last AEX 02/11/2021--nothing scheduled at the moment, I have sent pt a mychart msg to inquire about scheduling her for her next annual exam. However, not quite sure if pt has missed any doses so wanted to go ahead and send to you for review.   Mammo UTD-07/22/2021-neg birads 1

## 2022-05-22 ENCOUNTER — Other Ambulatory Visit (HOSPITAL_COMMUNITY): Payer: Self-pay

## 2022-05-23 ENCOUNTER — Ambulatory Visit: Payer: 59 | Attending: Internal Medicine | Admitting: Internal Medicine

## 2022-05-23 ENCOUNTER — Encounter: Payer: Self-pay | Admitting: Internal Medicine

## 2022-05-23 VITALS — BP 110/66 | HR 60 | Ht 64.0 in | Wt 146.0 lb

## 2022-05-23 DIAGNOSIS — R002 Palpitations: Secondary | ICD-10-CM | POA: Diagnosis not present

## 2022-05-23 MED ORDER — METOPROLOL SUCCINATE ER 25 MG PO TB24: 25.0000 mg | ORAL_TABLET | Freq: Every day | ORAL | 3 refills | Status: DC

## 2022-05-23 NOTE — Progress Notes (Signed)
HPI Mrs. Bohan returns today for followup of a cryptogenic stroke. She underwent ILR insertion. She has done well in the interim with no neuro symptoms, chest pain. She has brief palpitations. No problems with the ILR. She was exercising but stopped. She has had documented SVT of over 200/min.  Allergies  Allergen Reactions   Crestor [Rosuvastatin]     Muscle aches and pains   Lipitor [Atorvastatin]     Muscle pain   Propoxyphene     Darvocet - hallucinations    Sulfonamide Derivatives     fever     Current Outpatient Medications  Medication Sig Dispense Refill   acetaminophen (TYLENOL) 500 MG tablet Take 1,000 mg by mouth every 6 (six) hours as needed for moderate pain.     aspirin EC 81 MG EC tablet Take 1 tablet (81 mg total) by mouth daily. 90 tablet 0   fluticasone (FLONASE) 50 MCG/ACT nasal spray Place 1 spray into both nostrils daily as needed for allergies or rhinitis.     metoprolol succinate (TOPROL XL) 25 MG 24 hr tablet Take 1 tablet (25 mg total) by mouth daily. 90 tablet 3   Misc Natural Products (OSTEO BI-FLEX JOINT SHIELD PO) Take 2 tablets by mouth daily.     Multiple Vitamins-Minerals (MULTIVITAMIN ADULTS PO) Take 1 tablet by mouth daily.     Olopatadine HCl (PATADAY) 0.7 % SOLN Place 1 drop into both eyes daily.     venlafaxine XR (EFFEXOR-XR) 37.5 MG 24 hr capsule Take 1 capsule by mouth daily. Please make an appointment for further refills. 30 capsule 0   VITAMIN D-VITAMIN K PO Take 1 capsule by mouth daily.     No current facility-administered medications for this visit.     Past Medical History:  Diagnosis Date   Abnormal Pap smear of cervix 06/2015   ASCUS    Cervical dysplasia 2017   colposcopic biopsy   Elevated cholesterol    Leukemia [facility])    age 41, s/p chemotherapy   Migraines    Osteopenia 08/2014   Personal history of venous thrombosis and embolism    Stroke ([place]) 02/06/2019   acute left basal ganglia   Vitamin D deficiency      ROS:   All systems reviewed and negative except as noted in the HPI.   Past Surgical History:  Procedure Laterality Date   C-sections  2002, 2008   COLONOSCOPY WITH PROPOFOL N/A 11/23/2021   Procedure: COLONOSCOPY WITH PROPOFOL;  Surgeon: Harvel Quale, MD;  Location: [facility];  Service: Gastroenterology;  Laterality: N/A;  [place]   wisdom teeth extracted       Family History  Problem Relation Age of Onset   Stroke Mother    Coronary artery disease Father    Hypertension Father    Hemochromatosis Father    Cancer Father        prostate   Heart attack Father    Breast cancer Maternal Grandmother        56s   Osteoporosis Maternal Grandmother    Alzheimer's disease Maternal Grandmother    Coronary artery disease Paternal Grandmother    Diabetes Paternal Grandmother    Hypertension Paternal Grandmother      Social History   Socioeconomic History   Marital status: Married    Spouse name: Not on file   Number of children: Not on file   Years of education: Not  on file   Highest education level: Not on file  Occupational History   Occupation: RN    Employer: [employer]: [employer] and [employer]  Tobacco Use   Smoking status: Never   Smokeless tobacco: Never  Vaping Use   Vaping Use: Never used  Substance and Sexual Activity   Alcohol use: Yes    Alcohol/week: 0.0 standard drinks of alcohol    Comment: rare wine   Drug use: No   Sexual activity: Yes    Partners: Male    Birth control/protection: Post-menopausal  Other Topics Concern   Not on file  Social History Narrative   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    Social Determinants of Health   Financial Resource Strain: Not on file  Food Insecurity: Not on file  Transportation Needs: Not on file  Physical Activity: Not on file  Stress: Not on file  Social Connections: Not on file  Intimate Partner  Violence: Not on file     BP 110/66   Pulse 60   Ht '5\' 4"'$  (1.626 m)   Wt 146 lb (66.2 kg)   BMI 25.06 kg/m   Physical Exam:  Well appearing NAD HEENT: Unremarkable Neck:  No JVD, no thyromegally Lymphatics:  No adenopathy Back:  No CVA tenderness Lungs:  Clear HEART:  Regular rate rhythm, no murmurs, no rubs, no clicks Abd:  soft, positive bowel sounds, no organomegally, no rebound, no guarding Ext:  2 plus pulses, no edema, no cyanosis, no clubbing Skin:  No rashes no nodules Neuro:  CN II through XII intact, motor grossly intact  EKG - nsr  Assess/Plan:  1. Cryptogenic stroke - no recurrent symptoms. She will undergo watchful waiting. 2. ILR - her device is working normally but demonstrates no arrhythmias 3. 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 We will follow. 4. SVT - I have discussed the treatment options with the patient and recommended consideration for catheter ablation.   Royston Sinner Toran Murch,MD

## 2022-05-23 NOTE — Patient Instructions (Signed)
Medication Instructions:  Your physician has recommended you make the following change in your medication:    Start taking Toprol 25 mg-  Take one tablet by mouth daily  Labwork: None ordered.  Testing/Procedures: None ordered.  Follow-Up: Your physician wants you to follow-up in: February/March 2023.   You will receive a reminder letter in the mail two months in advance. If you don't receive a letter, please call our office to schedule the follow-up appointment.   Any Other Special Instructions Will Be Listed Below (If Applicable).  If you need a refill on your cardiac medications before your next appointment, please call your pharmacy.

## 2022-05-25 NOTE — Progress Notes (Signed)
Carelink Summary Report / Loop Recorder 

## 2022-05-25 NOTE — Telephone Encounter (Signed)
Pt schedule for 06/21/2022.

## 2022-05-29 ENCOUNTER — Ambulatory Visit (INDEPENDENT_AMBULATORY_CARE_PROVIDER_SITE_OTHER): Payer: 59

## 2022-05-29 DIAGNOSIS — I639 Cerebral infarction, unspecified: Secondary | ICD-10-CM | POA: Diagnosis not present

## 2022-05-29 LAB — CUP PACEART REMOTE DEVICE CHECK
Date Time Interrogation Session: 20230917232129
Implantable Pulse Generator Implant Date: 20200707

## 2022-05-30 ENCOUNTER — Ambulatory Visit: Payer: 59 | Admitting: Obstetrics and Gynecology

## 2022-06-01 ENCOUNTER — Encounter (HOSPITAL_COMMUNITY): Payer: Self-pay

## 2022-06-01 ENCOUNTER — Other Ambulatory Visit: Payer: Self-pay

## 2022-06-01 ENCOUNTER — Emergency Department (HOSPITAL_COMMUNITY)
Admission: EM | Admit: 2022-06-01 | Discharge: 2022-06-01 | Disposition: A | Discharge status: Home / Self Care | Payer: 59 | Attending: Emergency Medicine | Admitting: Emergency Medicine

## 2022-06-01 ENCOUNTER — Emergency Department (HOSPITAL_COMMUNITY): Payer: 59

## 2022-06-01 DIAGNOSIS — M549 Dorsalgia, unspecified: Secondary | ICD-10-CM | POA: Insufficient documentation

## 2022-06-01 DIAGNOSIS — Z8679 Personal history of other diseases of the circulatory system: Secondary | ICD-10-CM | POA: Diagnosis not present

## 2022-06-01 DIAGNOSIS — Z7982 Long term (current) use of aspirin: Secondary | ICD-10-CM | POA: Diagnosis not present

## 2022-06-01 DIAGNOSIS — M546 Pain in thoracic spine: Secondary | ICD-10-CM | POA: Diagnosis not present

## 2022-06-01 DIAGNOSIS — R000 Tachycardia, unspecified: Secondary | ICD-10-CM | POA: Diagnosis not present

## 2022-06-01 DIAGNOSIS — R079 Chest pain, unspecified: Secondary | ICD-10-CM | POA: Diagnosis not present

## 2022-06-01 HISTORY — DX: Other pulmonary embolism without acute cor pulmonale: I26.99

## 2022-06-01 LAB — BASIC METABOLIC PANEL
Anion gap: 7 (ref 5–15)
BUN: 22 mg/dL — ABNORMAL HIGH (ref 6–20)
CO2: 28 mmol/L (ref 22–32)
Calcium: 9.6 mg/dL (ref 8.9–10.3)
Chloride: 105 mmol/L (ref 98–111)
Creatinine, Ser: 0.7 mg/dL (ref 0.44–1.00)
GFR, Estimated: >60 mL/min (ref >60)
Glucose, Bld: 112 mg/dL — ABNORMAL HIGH (ref 70–99)
Potassium: 3.8 mmol/L (ref 3.5–5.1)
Sodium: 140 mmol/L (ref 135–145)

## 2022-06-01 LAB — CBC
HCT: 39.1 % (ref 36.0–46.0)
Hemoglobin: 13.4 g/dL (ref 12.0–15.0)
MCH: 32.2 pg (ref 26.0–34.0)
MCHC: 34.3 g/dL (ref 30.0–36.0)
MCV: 94 fL (ref 80.0–100.0)
Platelets: 237 10*3/uL (ref 150–400)
RBC: 4.16 MIL/uL (ref 3.87–5.11)
RDW: 12.2 % (ref 11.5–15.5)
WBC: 6.3 10*3/uL (ref 4.0–10.5)
nRBC: 0 % (ref 0.0–0.2)

## 2022-06-01 LAB — TROPONIN I (HIGH SENSITIVITY): Troponin I (High Sensitivity): <2 ng/L (ref <18)

## 2022-06-01 MED ORDER — METHOCARBAMOL 750 MG PO TABS: 750.0000 mg | ORAL_TABLET | Freq: Three times a day (TID) | ORAL | 0 refills | Status: DC | PRN

## 2022-06-01 MED ORDER — IBUPROFEN 400 MG PO TABS
400.0000 mg | ORAL_TABLET | Freq: Once | ORAL | Status: AC
  Administered 2022-06-01: 400 mg via ORAL
  Filled 2022-06-01: qty 1

## 2022-06-01 NOTE — Discharge Instructions (Addendum)
It was our pleasure to provide your ER care today - we hope that you feel better.  Overall your heart tests look good.   Take acetaminophen or ibuprofen as need. You may also try robaxin as need for muscle pain/spasm - no driving when taking.  You may try gentle massage, stretching or heat therapy to sore area.  Follow up with primary care doctor in one week if symptoms fail to improve/resolve.  Return to ER if worse, new symptoms, fevers, chest pain, trouble breathing, or other concern.

## 2022-06-01 NOTE — ED Provider Notes (Signed)
Judy Walter Provider Note   CSN: [id number] Arrival date & time: 06/01/22  1157     History  Chief Complaint  Patient presents with   Shoulder Pain    Judy Walter is a 51 y.o. female.  Pt c/o pain to upper back along medial border left scapula for past day. States awoke with it yesterday AM.  Denies known injury or strain. Pain constant, dull, non radiating, at rest. No anterior pain, no chest pain or discomfort. Indicates due to hx 'heart issues' was concerned and wanted to get checked. No associated nv, diaphoresis or sob. No pleuritic pain. No leg pain or swelling. No cough or uri symptoms. No fever or chills. No palpitations. No current or recent exertional chest pain or discomfort. No unusual doe.   The history is provided by the patient and medical records.  Shoulder Pain Associated symptoms: back pain   Associated symptoms: no fever and no neck pain        Home Medications Prior to Admission medications   Medication Sig Start Date End Date Taking? Authorizing Provider  acetaminophen (TYLENOL) 650 MG CR tablet Take 650 mg by mouth every 8 (eight) hours as needed for pain.   Yes [provider]  aspirin EC 81 MG EC tablet Take 1 tablet (81 mg total) by mouth daily. 02/09/19  Yes Desiree Hane, MD  fluticasone (FLONASE) 50 MCG/ACT nasal spray Place 1 spray into both nostrils daily as needed for allergies or rhinitis.   Yes [provider]  metoprolol succinate (TOPROL XL) 25 MG 24 hr tablet Take 1 tablet (25 mg total) by mouth daily. 05/23/22  Yes Evans Lance, MD  Misc Natural Products (OSTEO BI-FLEX JOINT SHIELD PO) Take 2 tablets by mouth daily.   Yes [provider]  Multiple Vitamins-Minerals (MULTIVITAMIN ADULTS PO) Take 1 tablet by mouth daily.   Yes [provider]  Olopatadine HCl (PATADAY) 0.7 % SOLN Place 1 drop into both eyes daily.   Yes [provider]  venlafaxine XR (EFFEXOR-XR) 37.5  MG 24 hr capsule Take 1 capsule by mouth daily. Please make an appointment for further refills. 05/19/22  Yes Amundson Raliegh Ip, MD  VITAMIN D-VITAMIN K PO Take 1 capsule by mouth daily.   Yes [provider]      Allergies    Crestor [rosuvastatin], Lipitor [atorvastatin], Propoxyphene, and Sulfonamide derivatives    Review of Systems   Review of Systems  Constitutional:  Negative for chills and fever.  HENT:  Negative for sore throat.   Eyes:  Negative for redness.  Respiratory:  Negative for cough and shortness of breath.   Cardiovascular:  Negative for chest pain, palpitations and leg swelling.  Gastrointestinal:  Negative for abdominal pain, nausea and vomiting.  Genitourinary:  Negative for flank pain.  Musculoskeletal:  Positive for back pain. Negative for neck pain.  Skin:  Negative for rash.  Neurological:  Negative for headaches.  Hematological:  Does not bruise/bleed easily.  Psychiatric/Behavioral:  Negative for confusion.     Physical Exam Updated Vital Signs BP 134/88 (BP Location: Right Arm)   Pulse 63   Temp 98.3 F (36.8 C) (Oral)   Resp 18   Ht 1.626 m ('5\' 4"'$ )   Wt 63.5 kg   SpO2 99%   BMI 24.03 kg/m  Physical Exam Vitals and nursing note reviewed.  Constitutional:      Appearance: Normal appearance. She is well-developed.  HENT:  Head: Atraumatic.     Nose: Nose normal.     Mouth/Throat:     Mouth: Mucous membranes are moist.  Eyes:     General: No scleral icterus.    Conjunctiva/sclera: Conjunctivae normal.  Neck:     Trachea: No tracheal deviation.  Cardiovascular:     Rate and Rhythm: Normal rate and regular rhythm.     Pulses: Normal pulses.     Heart sounds: Normal heart sounds. No murmur heard.    No friction rub. No gallop.  Pulmonary:     Effort: Pulmonary effort is normal. No respiratory distress.     Breath sounds: Normal breath sounds.  Abdominal:     General: There is no distension.     Tenderness: There is no  abdominal tenderness.  Genitourinary:    Comments: No cva tenderness.  Musculoskeletal:        General: No swelling or tenderness.     Cervical back: Normal range of motion and neck supple. No rigidity. No muscular tenderness.     Right lower leg: No edema.     Left lower leg: No edema.     Comments: Muscular tenderness medial to left scapula, reproducing symptoms. No sts, no skin changes or rash.   Skin:    General: Skin is warm and dry.     Findings: No rash.  Neurological:     Mental Status: She is alert.     Comments: Alert, speech normal.   Psychiatric:        Mood and Affect: Mood normal.     ED Results / Procedures / Treatments   Labs (all labs ordered are listed, but only abnormal results are displayed) Results for orders placed or performed during the hospital encounter of 71/69/67  Basic metabolic panel  Result Value Ref Range   Sodium 140 135 - 145 mmol/L   Potassium 3.8 3.5 - 5.1 mmol/L   Chloride 105 98 - 111 mmol/L   CO2 28 22 - 32 mmol/L   Glucose, Bld 112 (H) 70 - 99 mg/dL   BUN 22 (H) 6 - 20 mg/dL   Creatinine, Ser 0.70 0.44 - 1.00 mg/dL   Calcium 9.6 8.9 - 10.3 mg/dL   GFR, Estimated >60 >60 mL/min   Anion gap 7 5 - 15  CBC  Result Value Ref Range   WBC 6.3 4.0 - 10.5 K/uL   RBC 4.16 3.87 - 5.11 MIL/uL   Hemoglobin 13.4 12.0 - 15.0 g/dL   HCT 39.1 36.0 - 46.0 %   MCV 94.0 80.0 - 100.0 fL   MCH 32.2 26.0 - 34.0 pg   MCHC 34.3 30.0 - 36.0 g/dL   RDW 12.2 11.5 - 15.5 %   Platelets 237 150 - 400 K/uL   nRBC 0.0 0.0 - 0.2 %  Troponin I (High Sensitivity)  Result Value Ref Range   Troponin I (High Sensitivity) <2 <18 ng/L   DG Chest 2 View  Result Date: 06/01/2022 CLINICAL DATA:  Chest pain EXAM: CHEST - 2 VIEW COMPARISON:  08/19/2015 FINDINGS: Cardiac size is within normal limits. Lung fields are clear of any infiltrates or pulmonary edema. There is no pleural effusion or pneumothorax. There is implantable monitoring device in left anterior chest  wall. IMPRESSION: No active cardiopulmonary disease. Electronically Signed   By: Elmer Picker M.D.   On: 06/01/2022 12:54   CUP PACEART REMOTE DEVICE CHECK  Result Date: 05/29/2022 ILR summary report received. Battery status OK. Normal device function. No  new symptom,  brady, or pause episodes. No new AF episodes. Monthly summary reports and ROV/PRN 1 tachy event, duration 31mn, mean HR 188, EGM shows narrow regular rhythm LA     EKG EKG Interpretation  Date/Time:  Thursday June 01 2022 12:29:34 EDT Ventricular Rate:  61 PR Interval:  182 QRS Duration: 78 QT Interval:  416 QTC Calculation: 418 R Axis:   93 Text Interpretation: Normal sinus rhythm Rightward axis Nonspecific ST abnormality Confirmed by SLajean [phone] on 06/01/2022 12:34:10 PM  Radiology DG Chest 2 View  Result Date: 06/01/2022 CLINICAL DATA:  Chest pain EXAM: CHEST - 2 VIEW COMPARISON:  08/19/2015 FINDINGS: Cardiac size is within normal limits. Lung fields are clear of any infiltrates or pulmonary edema. There is no pleural effusion or pneumothorax. There is implantable monitoring device in left anterior chest wall. IMPRESSION: No active cardiopulmonary disease. Electronically Signed   By: PElmer PickerM.D.   On: 06/01/2022 12:54    Procedures Procedures    Medications Ordered in ED Medications  ibuprofen (ADVIL) tablet 400 mg (has no administration in time range)    ED Course/ Medical Decision Making/ A&P                           Medical Decision Making Problems Addressed: History of supraventricular tachycardia: chronic illness or injury Upper back pain: acute illness or injury with systemic symptoms  Amount and/or Complexity of Data Reviewed External Data Reviewed: notes. Labs: ordered. Decision-making details documented in ED Course. Radiology: ordered and independent interpretation performed. Decision-making details documented in ED Course. ECG/medicine tests: ordered and  independent interpretation performed. Decision-making details documented in ED Course.  Risk Prescription drug management. Decision regarding hospitalization.   Iv ns. Continuous pulse ox and cardiac monitoring. Labs ordered/sent. Imaging ordered.   Diff dx incluses atypical acs, msk pain/strain, ptx/pna, etc.  - dispo decision including potential need for admission considered - will get  labs and imaging and reassess.   Reviewed nursing notes and prior charts for additional history. External reports reviewed.   Cardiac monitor: sinus rhythm, rate 61  Labs reviewed/interpreted by me - trop normal - after constant symptoms at rest for past 24 hrs, felt not c/w acs.   Xrays reviewed/interpreted by me - no pna.   Ibuprofen po.  Symptoms appear most c/w msk strain/spasm.   Rx for home.  Rec pcp f/u.  Return precautions provided.          Final Clinical Impression(s) / ED Diagnoses Final diagnoses:  None    Rx / DC Orders ED Discharge Orders     None         SLajean Saver MD 06/01/22 [id number]

## 2022-06-01 NOTE — ED Triage Notes (Signed)
Pt to er, pt c/o L shoulder pain, pt states that the pain was there when she woke up yesterday morning, states that she took a tylenol arthritis and the pain got better, states that today she still had the pain and the tylenol didn't help, states that she has a cardiac hx and wants to make sure it isn't her heart.  States that yesterday movement made the pain worse, but today the pain is constant.

## 2022-06-02 NOTE — Progress Notes (Signed)
Carelink Summary Report / Loop Recorder 

## 2022-06-05 DIAGNOSIS — R5383 Other fatigue: Secondary | ICD-10-CM | POA: Diagnosis not present

## 2022-06-05 DIAGNOSIS — Z299 Encounter for prophylactic measures, unspecified: Secondary | ICD-10-CM | POA: Diagnosis not present

## 2022-06-05 DIAGNOSIS — M546 Pain in thoracic spine: Secondary | ICD-10-CM | POA: Diagnosis not present

## 2022-06-07 ENCOUNTER — Ambulatory Visit (HOSPITAL_COMMUNITY)
Admission: RE | Admit: 2022-06-07 | Discharge: 2022-06-07 | Disposition: A | Discharge status: Home / Self Care | Payer: 59 | Source: Ambulatory Visit | Attending: Nurse Practitioner | Admitting: Nurse Practitioner

## 2022-06-07 ENCOUNTER — Other Ambulatory Visit (HOSPITAL_COMMUNITY): Payer: Self-pay | Admitting: Nurse Practitioner

## 2022-06-07 DIAGNOSIS — M542 Cervicalgia: Secondary | ICD-10-CM | POA: Insufficient documentation

## 2022-06-07 DIAGNOSIS — M7918 Myalgia, other site: Secondary | ICD-10-CM | POA: Diagnosis not present

## 2022-06-07 DIAGNOSIS — M25512 Pain in left shoulder: Secondary | ICD-10-CM | POA: Insufficient documentation

## 2022-06-07 DIAGNOSIS — Z789 Other specified health status: Secondary | ICD-10-CM | POA: Diagnosis not present

## 2022-06-07 DIAGNOSIS — Z299 Encounter for prophylactic measures, unspecified: Secondary | ICD-10-CM | POA: Diagnosis not present

## 2022-06-07 DIAGNOSIS — R202 Paresthesia of skin: Secondary | ICD-10-CM | POA: Diagnosis not present

## 2022-06-12 DIAGNOSIS — S2341XA Sprain of ribs, initial encounter: Secondary | ICD-10-CM | POA: Diagnosis not present

## 2022-06-12 DIAGNOSIS — M546 Pain in thoracic spine: Secondary | ICD-10-CM | POA: Diagnosis not present

## 2022-06-12 DIAGNOSIS — S134XXA Sprain of ligaments of cervical spine, initial encounter: Secondary | ICD-10-CM | POA: Diagnosis not present

## 2022-06-13 DIAGNOSIS — S2341XA Sprain of ribs, initial encounter: Secondary | ICD-10-CM | POA: Diagnosis not present

## 2022-06-13 DIAGNOSIS — M546 Pain in thoracic spine: Secondary | ICD-10-CM | POA: Diagnosis not present

## 2022-06-13 DIAGNOSIS — S134XXA Sprain of ligaments of cervical spine, initial encounter: Secondary | ICD-10-CM | POA: Diagnosis not present

## 2022-06-16 DIAGNOSIS — M546 Pain in thoracic spine: Secondary | ICD-10-CM | POA: Diagnosis not present

## 2022-06-16 DIAGNOSIS — S2341XA Sprain of ribs, initial encounter: Secondary | ICD-10-CM | POA: Diagnosis not present

## 2022-06-16 DIAGNOSIS — S134XXA Sprain of ligaments of cervical spine, initial encounter: Secondary | ICD-10-CM | POA: Diagnosis not present

## 2022-06-21 ENCOUNTER — Other Ambulatory Visit (HOSPITAL_COMMUNITY): Payer: Self-pay

## 2022-06-21 ENCOUNTER — Ambulatory Visit (INDEPENDENT_AMBULATORY_CARE_PROVIDER_SITE_OTHER): Payer: 59 | Admitting: Obstetrics and Gynecology

## 2022-06-21 ENCOUNTER — Other Ambulatory Visit (HOSPITAL_COMMUNITY)
Admission: RE | Admit: 2022-06-21 | Discharge: 2022-06-21 | Disposition: A | Discharge status: Home / Self Care | Payer: 59 | Source: Ambulatory Visit | Attending: Obstetrics and Gynecology | Admitting: Obstetrics and Gynecology

## 2022-06-21 ENCOUNTER — Encounter: Payer: Self-pay | Admitting: Obstetrics and Gynecology

## 2022-06-21 VITALS — BP 118/68 | Resp 14 | Ht 64.0 in | Wt 149.0 lb

## 2022-06-21 DIAGNOSIS — Z01419 Encounter for gynecological examination (general) (routine) without abnormal findings: Secondary | ICD-10-CM

## 2022-06-21 DIAGNOSIS — Z124 Encounter for screening for malignant neoplasm of cervix: Secondary | ICD-10-CM | POA: Diagnosis not present

## 2022-06-21 MED ORDER — VENLAFAXINE HCL ER 37.5 MG PO CP24
ORAL_CAPSULE | ORAL | 3 refills | Status: DC
  Filled 2022-06-21: qty 90, 90d supply, fill #0
  Filled 2022-09-22: qty 90, 90d supply, fill #1
  Filled 2022-12-22: qty 90, 90d supply, fill #2
  Filled 2023-03-20: qty 90, 90d supply, fill #3

## 2022-06-21 NOTE — Patient Instructions (Addendum)
EXERCISE AND DIET:  We recommended that you start or continue a regular exercise program for good health. Regular exercise means any activity that makes your heart beat faster and makes you sweat.  We recommend exercising at least 30 minutes per day at least 3 days a week, preferably 4 or 5.  We also recommend a diet low in fat and sugar.  Inactivity, poor dietary choices and obesity can cause diabetes, heart attack, stroke, and kidney damage, among others.    ALCOHOL AND SMOKING:  Women should limit their alcohol intake to no more than 7 drinks/beers/glasses of wine (combined, not each!) per week. Moderation of alcohol intake to this level decreases your risk of breast cancer and liver damage. And of course, no recreational drugs are part of a healthy lifestyle.  And absolutely no smoking or even second hand smoke. Most people know smoking can cause heart and lung diseases, but did you know it also contributes to weakening of your bones? Aging of your skin?  Yellowing of your teeth and nails?  CALCIUM AND VITAMIN D:  Adequate intake of calcium and Vitamin D are recommended.  The recommendations for exact amounts of these supplements seem to change often, but generally speaking 600 mg of calcium (either carbonate or citrate) and 800 units of Vitamin D per day seems prudent. Certain women may benefit from higher intake of Vitamin D.  If you are among these women, your doctor will have told you during your visit.    PAP SMEARS:  Pap smears, to check for cervical cancer or precancers,  have traditionally been done yearly, although recent scientific advances have shown that most women can have pap smears less often.  However, every woman still should have a physical exam from her gynecologist every year. It will include a breast check, inspection of the vulva and vagina to check for abnormal growths or skin changes, a visual exam of the cervix, and then an exam to evaluate the size and shape of the uterus and  ovaries.  And after 52 years of age, a rectal exam is indicated to check for rectal cancers. We will also provide age appropriate advice regarding health maintenance, like when you should have certain vaccines, screening for sexually transmitted diseases, bone density testing, colonoscopy, mammograms, etc.   MAMMOGRAMS:  All women over 40 years old should have a yearly mammogram. Many facilities now offer a "3D" mammogram, which may cost around $50 extra out of pocket. If possible,  we recommend you accept the option to have the 3D mammogram performed.  It both reduces the number of women who will be called back for extra views which then turn out to be normal, and it is better than the routine mammogram at detecting truly abnormal areas.    COLONOSCOPY:  Colonoscopy to screen for colon cancer is recommended for all women at age 50.  We know, you hate the idea of the prep.  We agree, BUT, having colon cancer and not knowing it is worse!!  Colon cancer so often starts as a polyp that can be seen and removed at colonscopy, which can quite literally save your life!  And if your first colonoscopy is normal and you have no family history of colon cancer, most women don't have to have it again for 10 years.  Once every ten years, you can do something that may end up saving your life, right?  We will be happy to help you get it scheduled when you are ready.    Be sure to check your insurance coverage so you understand how much it will cost.  It may be covered as a preventative service at no cost, but you should check your particular policy.       Cardiac Ablation Cardiac ablation is a procedure to destroy, or ablate, a small amount of heart tissue in very specific places. The heart has many electrical connections. Sometimes these connections are abnormal and can cause the heart to beat very fast or irregularly. Ablating some of the areas that cause problems can improve the heart's rhythm or return it to normal.  Ablation may be done for people who: Have Wolff-Parkinson-White syndrome. Have fast heart rhythms (tachycardia). Have taken medicines for an abnormal heart rhythm (arrhythmia) that were not effective or caused side effects. Have a high-risk heartbeat that may be life-threatening. During the procedure, a small incision is made in the neck or the groin, and a long, thin tube (catheter) is inserted into the incision and moved to the heart. Small devices (electrodes) on the tip of the catheter will send out electrical currents. A type of X-ray (fluoroscopy) will be used to help guide the catheter and to provide images of the heart. Tell a health care provider about: Any allergies you have. All medicines you are taking, including vitamins, herbs, eye drops, creams, and over-the-counter medicines. Any problems you or family members have had with anesthetic medicines. Any blood disorders you have. Any surgeries you have had. Any medical conditions you have, such as kidney failure. Whether you are pregnant or may be pregnant. What are the risks? Generally, this is a safe procedure. However, problems may occur, including: Infection. Bruising and bleeding at the catheter insertion site. Bleeding into the chest, especially into the sac that surrounds the heart. This is a serious complication. Stroke or blood clots. Damage to nearby structures or organs. Allergic reaction to medicines or dyes. Need for a permanent pacemaker if the normal electrical system is damaged. A pacemaker is a small computer that sends electrical signals to the heart and helps your heart beat normally. The procedure not being fully effective. This may not be recognized until months later. Repeat ablation procedures are sometimes done. What happens before the procedure? Medicines Ask your health care provider about: Changing or stopping your regular medicines. This is especially important if you are taking diabetes medicines or  blood thinners. Taking medicines such as aspirin and ibuprofen. These medicines can thin your blood. Do not take these medicines unless your health care provider tells you to take them. Taking over-the-counter medicines, vitamins, herbs, and supplements. General instructions Follow instructions from your health care provider about eating or drinking restrictions. Plan to have someone take you home from the hospital or clinic. If you will be going home right after the procedure, plan to have someone with you for 24 hours. Ask your health care provider what steps will be taken to prevent infection. What happens during the procedure?  An IV will be inserted into one of your veins. You will be given a medicine to help you relax (sedative). The skin on your neck or groin will be numbed. An incision will be made in your neck or your groin. A needle will be inserted through the incision and into a large vein in your neck or groin. A catheter will be inserted into the needle and moved to your heart. Dye may be injected through the catheter to help your surgeon see the area of the heart that needs treatment.  Electrical currents will be sent from the catheter to ablate heart tissue in desired areas. There are three types of energy that may be used to do this: Heat (radiofrequency energy). Laser energy. Extreme cold (cryoablation). When the tissue has been ablated, the catheter will be removed. Pressure will be held on the insertion area to prevent a lot of bleeding. A bandage (dressing) will be placed over the insertion area. The exact procedure may vary among health care providers and hospitals. What happens after the procedure? Your blood pressure, heart rate, breathing rate, and blood oxygen level will be monitored until you leave the hospital or clinic. Your insertion area will be monitored for bleeding. You will need to lie still for a few hours to ensure that you do not bleed from the  insertion area. Do not drive for 24 hours or as long as told by your health care provider. Summary Cardiac ablation is a procedure to destroy, or ablate, a small amount of heart tissue using an electrical current. This procedure can improve the heart rhythm or return it to normal. Tell your health care provider about any medical conditions you may have and all medicines you are taking to treat them. This is a safe procedure, but problems may occur. Problems may include infection, bruising, damage to nearby organs or structures, or allergic reactions to medicines. Follow your health care provider's instructions about eating and drinking before the procedure. You may also be told to change or stop some of your medicines. After the procedure, do not drive for 24 hours or as long as told by your health care provider. This information is not intended to replace advice given to you by your health care provider. Make sure you discuss any questions you have with your health care provider. Document Revised: 11/18/2021 Document Reviewed: 07/07/2019 Elsevier Patient Education  [place].

## 2022-06-21 NOTE — Progress Notes (Signed)
51 y.o. G11P0013 Married Caucasian female here for annual exam.    Taking Effexor for menopausal symptoms.   On Metoprolol for SVT.  Asking about cardiac ablation.   PCP:   Dr. Woody Seller.   Patient's last menstrual period was 12/13/2017.           Sexually active: Yes.    The current method of family planning is post menopausal status.   Exercising: Yes.    Home exercise routine includes walking 1 hrs per day. Smoker:  no  Health Maintenance: Pap:  02/10/2021 atypical cells of undetermined significance. High risk HPV test is negative.  History of abnormal Pap:  yes.  Hx LGSIL. MMG:  07/23/2021 BI-RADS CATEGORY  1: Negative. Colonoscopy:  11/23/2021 BMD:   Normal with PCP.  TDaP:  n/a Gardasil:   no HIV:  neg.  Hep C:  no Screening Labs:  PCP   reports that she has never smoked. She has never used smokeless tobacco. She reports current alcohol use. She reports that she does not use drugs.  Past Medical History:  Diagnosis Date   Abnormal Pap smear of cervix 06/2015   ASCUS    Cervical dysplasia 2017   colposcopic biopsy   Elevated cholesterol    Leukemia [facility])    age 59, s/p chemotherapy   Migraines    Osteopenia 08/2014   PE (pulmonary thromboembolism) (HCC)    Personal history of venous thrombosis and embolism    Stroke ([place]) 02/06/2019   acute left basal ganglia   Vitamin D deficiency     Past Surgical History:  Procedure Laterality Date   C-sections  2002, 2008   COLONOSCOPY WITH PROPOFOL N/A 11/23/2021   Procedure: COLONOSCOPY WITH PROPOFOL;  Surgeon: Harvel Quale, MD;  [facility];  Service: Gastroenterology;  Laterality: N/A;  [place]   wisdom teeth extracted      Current Outpatient Medications  Medication Sig Dispense Refill   acetaminophen (TYLENOL) 650 MG CR tablet Take 650 mg by mouth every 8 (eight) hours as needed for pain.     aspirin EC 81 MG EC tablet Take 1 tablet (81 mg total) by mouth  daily. 90 tablet 0   fluticasone (FLONASE) 50 MCG/ACT nasal spray Place 1 spray into both nostrils daily as needed for allergies or rhinitis.     methocarbamol (ROBAXIN) 750 MG tablet Take 1 tablet (750 mg total) by mouth 3 (three) times daily as needed (muscle spasm/pain). 15 tablet 0   metoprolol succinate (TOPROL XL) 25 MG 24 hr tablet Take 1 tablet (25 mg total) by mouth daily. 90 tablet 3   Misc Natural Products (OSTEO BI-FLEX JOINT SHIELD PO) Take 2 tablets by mouth daily.     Multiple Vitamins-Minerals (MULTIVITAMIN ADULTS PO) Take 1 tablet by mouth daily.     Olopatadine HCl (PATADAY) 0.7 % SOLN Place 1 drop into both eyes daily.     venlafaxine XR (EFFEXOR-XR) 37.5 MG 24 hr capsule Take 1 capsule by mouth daily. Please make an appointment for further refills. 30 capsule 0   VITAMIN D-VITAMIN K PO Take 1 capsule by mouth daily.     No current facility-administered medications for this visit.    Family History  Problem Relation Age of Onset   Stroke Mother    Coronary artery disease Father    Hypertension Father    Hemochromatosis Father    Cancer Father  prostate   Heart attack Father    Breast cancer Maternal Grandmother        59s   Osteoporosis Maternal Grandmother    Alzheimer's disease Maternal Grandmother    Coronary artery disease Paternal Grandmother    Diabetes Paternal Grandmother    Hypertension Paternal Grandmother     Review of Systems  All other systems reviewed and are negative.   Exam:   BP 118/68 (BP Location: Right Arm, Patient Position: Sitting, Cuff Size: Normal)   Ht '5\' 4"'$  (1.626 m)   Wt 149 lb (67.6 kg)   LMP 12/13/2017   BMI 25.58 kg/m     General appearance: alert, cooperative and appears stated age Head: normocephalic, without obvious abnormality, atraumatic Neck: no adenopathy, supple, symmetrical, trachea midline and thyroid normal to inspection and palpation Lungs: clear to auscultation bilaterally Breasts: normal appearance, no  masses or tenderness, No nipple retraction or dimpling, No nipple discharge or bleeding, No axillary adenopathy Heart: regular rate and rhythm Abdomen: soft, non-tender; no masses, no organomegaly Extremities: extremities normal, atraumatic, no cyanosis or edema Skin: skin color, texture, turgor normal. No rashes or lesions Lymph nodes: cervical, supraclavicular, and axillary nodes normal. Neurologic: grossly normal  Pelvic: External genitalia:  no lesions              No abnormal inguinal nodes palpated.              Urethra:  normal appearing urethra with no masses, tenderness or lesions              Bartholins and Skenes: normal                 Vagina: normal appearing vagina with normal color and discharge, no lesions              Cervix: no lesions              Pap taken: yes Bimanual Exam:  Uterus:  normal size, contour, position, consistency, mobility, non-tender              Adnexa: no mass, fullness, tenderness              Rectal exam: yes.  Confirms.              Anus:  normal sphincter tone, no lesions  Chaperone was present for exam:  Kimalexis, CMA  Assessment:   Well woman visit with gynecologic exam. Menopausal symptoms.  Controlled on Effexor.  Hx PE.  Hx stress incontinence and overactive bladder.  Hx cryptogenic stroke.  Loop recorder in left medial breast.  SVT.  On Metoprolol. 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   Plan: Mammogram screening discussed.  She will schedule.  Self breast awareness reviewed. Pap and HR HPV collected. Guidelines for Calcium, Vitamin D, regular exercise program including cardiovascular and weight bearing exercise. Refill of Effexor XR 37.5 mg daily.  Written information on cardiac ablation.  I encouraged her to see her cardiologist for further discussion.  Follow up annually and prn.   After visit summary provided.

## 2022-06-23 LAB — CYTOLOGY - PAP
Comment: NEGATIVE
Diagnosis: UNDETERMINED — AB
High risk HPV: NEGATIVE

## 2022-06-26 ENCOUNTER — Other Ambulatory Visit (HOSPITAL_COMMUNITY): Payer: Self-pay

## 2022-07-03 ENCOUNTER — Ambulatory Visit (INDEPENDENT_AMBULATORY_CARE_PROVIDER_SITE_OTHER): Payer: 59

## 2022-07-03 DIAGNOSIS — I639 Cerebral infarction, unspecified: Secondary | ICD-10-CM | POA: Diagnosis not present

## 2022-07-03 LAB — CUP PACEART REMOTE DEVICE CHECK
Date Time Interrogation Session: 20231022230310
Implantable Pulse Generator Implant Date: 20200707

## 2022-07-10 ENCOUNTER — Other Ambulatory Visit: Payer: Self-pay | Admitting: Obstetrics and Gynecology

## 2022-07-10 DIAGNOSIS — Z1231 Encounter for screening mammogram for malignant neoplasm of breast: Secondary | ICD-10-CM

## 2022-07-24 NOTE — Progress Notes (Signed)
Carelink Summary Report / Loop Recorder 

## 2022-08-07 ENCOUNTER — Ambulatory Visit (INDEPENDENT_AMBULATORY_CARE_PROVIDER_SITE_OTHER): Payer: 59

## 2022-08-07 DIAGNOSIS — I639 Cerebral infarction, unspecified: Secondary | ICD-10-CM | POA: Diagnosis not present

## 2022-08-07 LAB — CUP PACEART REMOTE DEVICE CHECK
Date Time Interrogation Session: 20231126231135
Implantable Pulse Generator Implant Date: 20200707

## 2022-08-31 ENCOUNTER — Ambulatory Visit: Payer: 59

## 2022-09-01 ENCOUNTER — Ambulatory Visit
Admission: RE | Admit: 2022-09-01 | Discharge: 2022-09-01 | Disposition: A | Discharge status: Home / Self Care | Payer: 59 | Source: Ambulatory Visit | Attending: Obstetrics and Gynecology | Admitting: Obstetrics and Gynecology

## 2022-09-01 DIAGNOSIS — Z1231 Encounter for screening mammogram for malignant neoplasm of breast: Secondary | ICD-10-CM | POA: Diagnosis not present

## 2022-09-05 ENCOUNTER — Telehealth: Payer: Self-pay

## 2022-09-05 NOTE — Telephone Encounter (Signed)
Received the following alert from [employer]:   ILR alert for RRT, triggered 09/01/22. Routing for further review and management. - JJB

## 2022-09-08 NOTE — Telephone Encounter (Signed)
Called patient informed her that loop would soon no longer be active patient wishes to have device taken out, patient has apt with Dr Lovena Le on 10/31/21 to discuss medication patient questioning if she needs another, informed patient that we usually do not put another loop recorder in for patients with cryptogenic stroke and no AF was found in 3 years. Advised patient would that I forward this to the scheduler as usually loop explants are 60mn apts.

## 2022-09-12 ENCOUNTER — Ambulatory Visit (INDEPENDENT_AMBULATORY_CARE_PROVIDER_SITE_OTHER): Payer: 59

## 2022-09-12 DIAGNOSIS — I639 Cerebral infarction, unspecified: Secondary | ICD-10-CM

## 2022-09-12 LAB — CUP PACEART REMOTE DEVICE CHECK
Date Time Interrogation Session: 20240101231946
Implantable Pulse Generator Implant Date: 20200707

## 2022-09-13 ENCOUNTER — Other Ambulatory Visit (HOSPITAL_COMMUNITY): Payer: Self-pay

## 2022-09-14 NOTE — Progress Notes (Signed)
Carelink Summary Report / Loop Recorder 

## 2022-09-22 ENCOUNTER — Other Ambulatory Visit (HOSPITAL_COMMUNITY): Payer: Self-pay

## 2022-10-06 NOTE — Progress Notes (Signed)
Carelink Summary Report / Loop Recorder

## 2022-10-13 LAB — CUP PACEART REMOTE DEVICE CHECK
Date Time Interrogation Session: 20240201233035
Implantable Pulse Generator Implant Date: 20200707

## 2022-10-16 ENCOUNTER — Ambulatory Visit (INDEPENDENT_AMBULATORY_CARE_PROVIDER_SITE_OTHER): Payer: 59

## 2022-10-16 DIAGNOSIS — I639 Cerebral infarction, unspecified: Secondary | ICD-10-CM

## 2022-10-31 ENCOUNTER — Encounter: Payer: Self-pay | Admitting: Internal Medicine

## 2022-11-01 DIAGNOSIS — R0981 Nasal congestion: Secondary | ICD-10-CM | POA: Diagnosis not present

## 2022-11-01 DIAGNOSIS — U071 COVID-19: Secondary | ICD-10-CM | POA: Diagnosis not present

## 2022-11-24 NOTE — Progress Notes (Signed)
Carelink Summary Report / Loop Recorder 

## 2022-12-15 ENCOUNTER — Telehealth: Payer: Self-pay | Admitting: Internal Medicine

## 2022-12-15 DIAGNOSIS — Z8679 Personal history of other diseases of the circulatory system: Secondary | ICD-10-CM | POA: Diagnosis not present

## 2022-12-15 DIAGNOSIS — G43909 Migraine, unspecified, not intractable, without status migrainosus: Secondary | ICD-10-CM | POA: Diagnosis not present

## 2022-12-15 DIAGNOSIS — Z299 Encounter for prophylactic measures, unspecified: Secondary | ICD-10-CM | POA: Diagnosis not present

## 2022-12-15 DIAGNOSIS — R42 Dizziness and giddiness: Secondary | ICD-10-CM | POA: Diagnosis not present

## 2022-12-15 DIAGNOSIS — R519 Headache, unspecified: Secondary | ICD-10-CM | POA: Diagnosis not present

## 2022-12-15 NOTE — Telephone Encounter (Signed)
Pt c/o BP issue: STAT if pt c/o blurred vision, one-sided weakness or slurred speech  1. What are your last 5 BP readings? 150/100 - Wednesday night 139/89 - Last Night  111/78 - This Morning  2. Are you having any other symptoms (ex. Dizziness, headache, blurred vision, passed out)? Headache, and dizziness (felt like vertigo) On Wednesday and last night had a horrible headache.  3. What is your BP issue? Pt stated that the BP is running high starting this Wednesday. Pt state that she normally takes her Metoprolol very religiously, but she did forget to take it for a couple of days. Pt is very concerned about their BP. Please advise

## 2022-12-15 NOTE — Telephone Encounter (Signed)
Patient stated she is taking Metoprolol for SVT. One day last week pt stated her HR was in the 40's , she had something to eat and drink, then she rechecked her HR and it was WNL. This past Wednesday pt stated she was experiencing vertigo, BP 130/101. Then she realized missed a dose of Metoprolol. As of this morning BP 11/78, HR 78, patient stated her HR is normally between 50-60. This week she has been experiencing headaches off and on. Patient stated when she was first prescribed this medication it was advised to take in the PM, however, she was taking it in the AM. This was easier because she was taking all of her other medications in the AM. Since this past Wednesday  she is currently taking Metoprolol in the PM. Advised patient to contact PCP. Explained ED precautions. Patient voiced understanding. Will forward to MD and nurse.

## 2022-12-17 NOTE — Telephone Encounter (Signed)
I would take the long acting metoprolol at night after eating dinner.

## 2022-12-18 DIAGNOSIS — R519 Headache, unspecified: Secondary | ICD-10-CM | POA: Diagnosis not present

## 2022-12-18 DIAGNOSIS — R42 Dizziness and giddiness: Secondary | ICD-10-CM | POA: Diagnosis not present

## 2022-12-18 DIAGNOSIS — Z8673 Personal history of transient ischemic attack (TIA), and cerebral infarction without residual deficits: Secondary | ICD-10-CM | POA: Diagnosis not present

## 2022-12-18 DIAGNOSIS — G43909 Migraine, unspecified, not intractable, without status migrainosus: Secondary | ICD-10-CM | POA: Diagnosis not present

## 2022-12-18 NOTE — Telephone Encounter (Signed)
Message received in [facility] Triage.    Per Dr. Lewayne Bunting, it is ok for Pt to take Toprol XL at night after eating dinner, to address symptoms in note below.    Pt called and stated she saw a NP at her PCP office on 4/5;  NP thought Pt had been experiencing Lt sided migraine headaches, and has submitted a neurology consult for follow up care / assessment, per Pt.  Pt stated she started taking Toprol XL after dinner, and all of her symptoms have improved.  Pt appreciated [facility]'s follow up to her concerns.  Pt advised to reach out to [place] if she has new or worsening symptoms, and to call [facility].  Pt verbally understood, and NP following Pt POC at this point in time.

## 2022-12-22 ENCOUNTER — Other Ambulatory Visit (HOSPITAL_COMMUNITY): Payer: Self-pay

## 2023-01-23 ENCOUNTER — Ambulatory Visit: Payer: Commercial Managed Care - PPO | Attending: Internal Medicine | Admitting: Internal Medicine

## 2023-01-23 ENCOUNTER — Encounter: Payer: Self-pay | Admitting: Internal Medicine

## 2023-01-23 VITALS — BP 100/72 | HR 54 | Ht 64.0 in | Wt 134.0 lb

## 2023-01-23 DIAGNOSIS — I472 Ventricular tachycardia, unspecified: Secondary | ICD-10-CM

## 2023-01-23 DIAGNOSIS — I639 Cerebral infarction, unspecified: Secondary | ICD-10-CM

## 2023-01-23 NOTE — Progress Notes (Signed)
HPI Judy Walter returns today for followup of a cryptogenic stroke. She underwent ILR insertion. She has done well in the interim with no neuro symptoms, chest pain. She has brief palpitations. No problems with the ILR. She was exercising but stopped. She has had documented SVT of over 200/min. But no atrial fib. She is at [facility].   Allergies  Allergen Reactions   Crestor [Rosuvastatin]     Muscle aches and pains   Lipitor [Atorvastatin]     Muscle pain   Propoxyphene     Darvocet - hallucinations    Sulfonamide Derivatives     fever     Current Outpatient Medications  Medication Sig Dispense Refill   acetaminophen (TYLENOL) 650 MG CR tablet Take 650 mg by mouth every 8 (eight) hours as needed for pain.     aspirin EC 81 MG EC tablet Take 1 tablet (81 mg total) by mouth daily. 90 tablet 0   fluticasone (FLONASE) 50 MCG/ACT nasal spray Place 1 spray into both nostrils daily as needed for allergies or rhinitis.     methocarbamol (ROBAXIN) 750 MG tablet Take 1 tablet (750 mg total) by mouth 3 (three) times daily as needed (muscle spasm/pain). 15 tablet 0   metoprolol succinate (TOPROL XL) 25 MG 24 hr tablet Take 1 tablet (25 mg total) by mouth daily. 90 tablet 3   Misc Natural Products (OSTEO BI-FLEX JOINT SHIELD PO) Take 2 tablets by mouth daily.     Multiple Vitamins-Minerals (MULTIVITAMIN ADULTS PO) Take 1 tablet by mouth daily.     Olopatadine HCl (PATADAY) 0.7 % SOLN Place 1 drop into both eyes daily.     venlafaxine XR (EFFEXOR-XR) 37.5 MG 24 hr capsule Take one capsule (37.5 mg) by mouth daily. 90 capsule 3   VITAMIN D-VITAMIN K PO Take 1 capsule by mouth daily.     No current facility-administered medications for this visit.     Past Medical History:  Diagnosis Date   Abnormal Pap smear of cervix 06/2015   ASCUS    Cervical dysplasia 2017   colposcopic biopsy   Elevated cholesterol    Leukemia [facility])    age 84, s/p chemotherapy   Migraines    Osteopenia  08/2014   PE (pulmonary thromboembolism) (HCC)    Personal history of venous thrombosis and embolism    Stroke (HCC) 02/06/2019   acute left basal ganglia   Vitamin D deficiency     ROS:   All systems reviewed and negative except as noted in the HPI.   Past Surgical History:  Procedure Laterality Date   C-sections  2002, 2008   COLONOSCOPY WITH PROPOFOL N/A 11/23/2021   Procedure: COLONOSCOPY WITH PROPOFOL;  Surgeon: Dolores Frame, MD;  [facility];  Service: Gastroenterology;  Laterality: N/A;  730   DILATION AND CURETTAGE OF UTERUS  1997   wisdom teeth extracted       Family History  Problem Relation Age of Onset   Stroke Mother    Coronary artery disease Father    Hypertension Father    Hemochromatosis Father    Cancer Father        prostate   Heart attack Father    Breast cancer Maternal Grandmother        56s   Osteoporosis Maternal Grandmother    Alzheimer's disease Maternal Grandmother    Coronary artery disease Paternal Grandmother    Diabetes Paternal Grandmother  Hypertension Paternal Grandmother      Social History   Socioeconomic History   Marital status: Married    Spouse name: Not on file   Number of children: Not on file   Years of education: Not on file   Highest education level: Not on file  Occupational History   Occupation: RN    Employer: [employer]    Comment: [employer]  Tobacco Use   Smoking status: Never   Smokeless tobacco: Never  Vaping Use   Vaping Use: Never used  Substance and Sexual Activity   Alcohol use: Yes    Alcohol/week: 0.0 standard drinks of alcohol    Comment: rare wine   Drug use: No   Sexual activity: Yes    Partners: Male    Birth control/protection: Post-menopausal  Other Topics Concern   Not on file  Social History Narrative   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    Social Determinants of Health   Financial Resource Strain: Not on  file  Food Insecurity: Not on file  Transportation Needs: Not on file  Physical Activity: Not on file  Stress: Not on file  Social Connections: Not on file  Intimate Partner Violence: Not on file     BP 100/72   Pulse (!) 54   Ht 5\' 4"  (1.626 m)   Wt 134 lb (60.8 kg)   LMP 12/13/2017   SpO2 99%   BMI 23.00 kg/m   Physical Exam:  Well appearing 52 yo woman, NAD HEENT: Unremarkable Neck:  No JVD, no thyromegally Lymphatics:  No adenopathy Back:  No CVA tenderness Lungs:  Clear with no wheezes HEART:  Regular rate rhythm, no murmurs, no rubs, no clicks Abd:  soft, positive bowel sounds, no organomegally, no rebound, no guarding Ext:  2 plus pulses, no edema, no cyanosis, no clubbing Skin:  No rashes no nodules Neuro:  CN II through XII intact, motor grossly intact  EKG - sinus brady  Assess/Plan: Cryptogenic stroke - she has not had any atrial fib. She will continue low dose asa SVT - She has been stable on low dose toprol. Continue ILR - her device is at end of service. I have recommended removal. She would like to proceed.  EP Procedure Note  Procedure performed: ILR removal.  Preop diagnosis: cryptogenic stroke s/p ILR insertion.  Postop diagnosis: same as preop  Description of the procedure: after informed consent was obtained, the patient was prepped and draped in a sterile fashion. 8 cc of lidocaine was infiltrated into the left pectoral region. A one cm incision was carried out. A combination of blunt and sharp dissection was utilized and the ILR was grasped. The scar tissue was cut away. The device was removed with gentle traction. Benzoin and steristrips were painted on the skin. A bandage was applied and the patient recovered in the usual manner.  Complications: none immediately.   Conclusion: successful ILR removal with complication.  Judy Gowda Valeriano Bain,MD

## 2023-01-23 NOTE — Patient Instructions (Addendum)
Medication Instructions:  Your physician recommends that you continue on your current medications as directed. Please refer to the Current Medication list given to you today.  Labwork: None ordered.  Testing/Procedures: None ordered.  Follow-Up:  Your physician wants you to follow-up in: one year with EP APP.  You will receive a reminder letter in the mail two months in advance. If you don't receive a letter, please call our office to schedule the follow-up appointment.    Implantable Loop Recorder Removal, Care After This sheet gives you information about how to care for yourself after your procedure. Your health care provider may also give you more specific instructions. If you have problems or questions, contact your health care provider. What can I expect after the procedure? After the procedure, it is common to have: Soreness or discomfort near the incision. Some swelling or bruising near the incision.  Follow these instructions at home: Incision care  Monitor your cardiac device site for redness, swelling, and drainage. Call the device clinic at [phone] if you experience these symptoms or fever/chills.  Keep the large square bandage on your site for 24 hours and then you may remove it yourself. Keep the steri-strips underneath in place.   You may shower after 72 hours / 3 days from your procedure with the steri-strips in place. They will usually fall off on their own, or may be removed after 10 days. Pat dry.   Avoid lotions, ointments, or perfumes over your incision until it is well-healed.  Please do not submerge in water until your site is completely healed.   If your wound site starts to bleed apply pressure.       If you have any questions/concerns please call the device clinic at [phone].  Activity  Return to your normal activities.  Contact a health care provider if: You have redness, swelling, or pain around your incision. You have a fever.

## 2023-02-13 ENCOUNTER — Other Ambulatory Visit: Payer: Self-pay | Admitting: Internal Medicine

## 2023-03-20 ENCOUNTER — Other Ambulatory Visit (HOSPITAL_COMMUNITY): Payer: Self-pay

## 2023-04-24 DIAGNOSIS — R5383 Other fatigue: Secondary | ICD-10-CM | POA: Diagnosis not present

## 2023-04-24 DIAGNOSIS — Z299 Encounter for prophylactic measures, unspecified: Secondary | ICD-10-CM | POA: Diagnosis not present

## 2023-04-24 DIAGNOSIS — E7800 Pure hypercholesterolemia, unspecified: Secondary | ICD-10-CM | POA: Diagnosis not present

## 2023-04-24 DIAGNOSIS — Z0000 Encounter for general adult medical examination without abnormal findings: Secondary | ICD-10-CM | POA: Diagnosis not present

## 2023-04-24 DIAGNOSIS — Z79899 Other long term (current) drug therapy: Secondary | ICD-10-CM | POA: Diagnosis not present

## 2023-04-24 DIAGNOSIS — Z1331 Encounter for screening for depression: Secondary | ICD-10-CM | POA: Diagnosis not present

## 2023-05-09 ENCOUNTER — Other Ambulatory Visit: Payer: Self-pay

## 2023-05-09 ENCOUNTER — Emergency Department (HOSPITAL_COMMUNITY)
Admission: EM | Admit: 2023-05-09 | Discharge: 2023-05-10 | Disposition: A | Discharge status: Home / Self Care | Payer: Commercial Managed Care - PPO | Attending: Emergency Medicine | Admitting: Emergency Medicine

## 2023-05-09 ENCOUNTER — Encounter (HOSPITAL_COMMUNITY): Payer: Self-pay | Admitting: Emergency Medicine

## 2023-05-09 DIAGNOSIS — Z7982 Long term (current) use of aspirin: Secondary | ICD-10-CM | POA: Insufficient documentation

## 2023-05-09 DIAGNOSIS — N200 Calculus of kidney: Secondary | ICD-10-CM | POA: Diagnosis not present

## 2023-05-09 DIAGNOSIS — R1084 Generalized abdominal pain: Secondary | ICD-10-CM | POA: Insufficient documentation

## 2023-05-09 DIAGNOSIS — Z8673 Personal history of transient ischemic attack (TIA), and cerebral infarction without residual deficits: Secondary | ICD-10-CM | POA: Insufficient documentation

## 2023-05-09 DIAGNOSIS — R109 Unspecified abdominal pain: Secondary | ICD-10-CM | POA: Diagnosis not present

## 2023-05-09 LAB — CBC
HCT: 42.2 % (ref 36.0–46.0)
Hemoglobin: 14.2 g/dL (ref 12.0–15.0)
MCH: 31.9 pg (ref 26.0–34.0)
MCHC: 33.6 g/dL (ref 30.0–36.0)
MCV: 94.8 fL (ref 80.0–100.0)
Platelets: 245 10*3/uL (ref 150–400)
RBC: 4.45 MIL/uL (ref 3.87–5.11)
RDW: 12.3 % (ref 11.5–15.5)
WBC: 8.9 10*3/uL (ref 4.0–10.5)
nRBC: 0 % (ref 0.0–0.2)

## 2023-05-09 LAB — COMPREHENSIVE METABOLIC PANEL
ALT: 25 U/L (ref 0–44)
AST: 23 U/L (ref 15–41)
Albumin: 3.8 g/dL (ref 3.5–5.0)
Alkaline Phosphatase: 63 U/L (ref 38–126)
Anion gap: 8 (ref 5–15)
BUN: 18 mg/dL (ref 6–20)
CO2: 28 mmol/L (ref 22–32)
Calcium: 9.7 mg/dL (ref 8.9–10.3)
Chloride: 98 mmol/L (ref 98–111)
Creatinine, Ser: 0.54 mg/dL (ref 0.44–1.00)
GFR, Estimated: >60 mL/min (ref >60)
Glucose, Bld: 124 mg/dL — ABNORMAL HIGH (ref 70–99)
Potassium: 3.8 mmol/L (ref 3.5–5.1)
Sodium: 134 mmol/L — ABNORMAL LOW (ref 135–145)
Total Bilirubin: 0.7 mg/dL (ref 0.3–1.2)
Total Protein: 6.8 g/dL (ref 6.5–8.1)

## 2023-05-09 LAB — URINALYSIS, ROUTINE W REFLEX MICROSCOPIC
Bilirubin Urine: NEGATIVE
Glucose, UA: NEGATIVE mg/dL
Hgb urine dipstick: NEGATIVE
Ketones, ur: NEGATIVE mg/dL
Nitrite: NEGATIVE
Protein, ur: NEGATIVE mg/dL
Specific Gravity, Urine: 1.006 (ref 1.005–1.030)
pH: 8 (ref 5.0–8.0)

## 2023-05-09 LAB — LIPASE, BLOOD: Lipase: 31 U/L (ref 11–51)

## 2023-05-09 MED ORDER — ONDANSETRON 4 MG PO TBDP
4.0000 mg | ORAL_TABLET | Freq: Once | ORAL | Status: AC | PRN
  Administered 2023-05-09: 4 mg via ORAL
  Filled 2023-05-09: qty 1

## 2023-05-09 NOTE — ED Triage Notes (Signed)
Pt complains of right sided abd pain and nausea that started at 2pm today. Pt is nauseated unable to vomit.

## 2023-05-10 ENCOUNTER — Emergency Department (HOSPITAL_COMMUNITY): Payer: Commercial Managed Care - PPO

## 2023-05-10 DIAGNOSIS — R109 Unspecified abdominal pain: Secondary | ICD-10-CM | POA: Diagnosis not present

## 2023-05-10 DIAGNOSIS — N200 Calculus of kidney: Secondary | ICD-10-CM | POA: Diagnosis not present

## 2023-05-10 MED ORDER — MORPHINE SULFATE (PF) 4 MG/ML IV SOLN
4.0000 mg | Freq: Once | INTRAVENOUS | Status: AC
  Administered 2023-05-10: 4 mg via INTRAVENOUS
  Filled 2023-05-10: qty 1

## 2023-05-10 MED ORDER — ONDANSETRON 4 MG PO TBDP: 4.0000 mg | ORAL_TABLET | Freq: Three times a day (TID) | ORAL | 0 refills | Status: DC | PRN

## 2023-05-10 MED ORDER — ONDANSETRON HCL 4 MG/2ML IJ SOLN
4.0000 mg | Freq: Once | INTRAMUSCULAR | Status: AC
  Administered 2023-05-10: 4 mg via INTRAVENOUS
  Filled 2023-05-10: qty 2

## 2023-05-10 NOTE — ED Provider Notes (Signed)
[facility] AT [facility] Provider Note   CSN: [id number] Arrival date & time: 05/09/23  2044     History  Chief Complaint  Patient presents with   Abdominal Pain   Nausea    Judy Walter is a 52 y.o. female.  HPI     This is a 52 year old female who presents with abdominal pain.  Patient reports fairly acute onset of abdominal pain around 2 PM yesterday afternoon.  She states it was in the midline and in the right abdomen and right flank.  She took Pepto-Bismol with minimal relief.  She has had nausea without vomiting.  She reports normal bowel movement earlier yesterday.  No diarrhea.  No fevers.  She denies urinary symptoms such as hematuria or dysuria.  No known history of kidney stones.  Home Medications Prior to Admission medications   Medication Sig Start Date End Date Taking? Authorizing Provider  ondansetron (ZOFRAN-ODT) 4 MG disintegrating tablet Take 1 tablet (4 mg total) by mouth every 8 (eight) hours as needed. 05/10/23  Yes Declyn Offield, Mayer Masker, MD  acetaminophen (TYLENOL) 650 MG CR tablet Take 650 mg by mouth every 8 (eight) hours as needed for pain.    [provider]  aspirin EC 81 MG EC tablet Take 1 tablet (81 mg total) by mouth daily. 02/09/19   Laverna Peace, MD  fluticasone (FLONASE) 50 MCG/ACT nasal spray Place 1 spray into both nostrils daily as needed for allergies or rhinitis.    [provider]  methocarbamol (ROBAXIN) 750 MG tablet Take 1 tablet (750 mg total) by mouth 3 (three) times daily as needed (muscle spasm/pain). 06/01/22   Cathren Laine, MD  metoprolol succinate (TOPROL-XL) 25 MG 24 hr tablet take 1 tablet once daily. 02/14/23   Marinus Maw, MD  Misc Natural Products (OSTEO BI-FLEX JOINT SHIELD PO) Take 2 tablets by mouth daily.    [provider]  Multiple Vitamins-Minerals (MULTIVITAMIN ADULTS PO) Take 1 tablet by mouth daily.    [provider]  Olopatadine HCl (PATADAY)  0.7 % SOLN Place 1 drop into both eyes daily.    [provider]  venlafaxine XR (EFFEXOR-XR) 37.5 MG 24 hr capsule Take 1 capsule (37.5 mg) by mouth daily. 06/21/22   Patton Salles, MD  VITAMIN D-VITAMIN K PO Take 1 capsule by mouth daily.    [provider]      Allergies    Crestor [rosuvastatin], Lipitor [atorvastatin], Propoxyphene, and Sulfonamide derivatives    Review of Systems   Review of Systems  Gastrointestinal:  Positive for abdominal pain and nausea. Negative for diarrhea and vomiting.  All other systems reviewed and are negative.   Physical Exam Updated Vital Signs BP 122/80   Pulse (!) 54   Temp 97.8 F (36.6 C) (Oral)   Resp 16   Ht 1.626 m (5\' 4" )   Wt 60.8 kg   LMP 12/13/2017   SpO2 97%   BMI 23.00 kg/m  Physical Exam Vitals and nursing note reviewed.  Constitutional:      Appearance: She is well-developed. She is not ill-appearing.  HENT:     Head: Normocephalic and atraumatic.  Eyes:     Pupils: Pupils are equal, round, and reactive to light.  Cardiovascular:     Rate and Rhythm: Normal rate and regular rhythm.     Heart sounds: Normal heart sounds.  Pulmonary:     Effort: Pulmonary effort is normal. No respiratory  distress.     Breath sounds: No wheezing.  Abdominal:     General: Bowel sounds are normal.     Palpations: Abdomen is soft.     Tenderness: There is no abdominal tenderness. There is no guarding or rebound.  Musculoskeletal:     Cervical back: Neck supple.  Skin:    General: Skin is warm and dry.  Neurological:     General: No focal deficit present.     Mental Status: She is alert and oriented to person, place, and time.     ED Results / Procedures / Treatments   Labs (all labs ordered are listed, but only abnormal results are displayed) Labs Reviewed  COMPREHENSIVE METABOLIC PANEL - Abnormal; Notable for the following components:      Result Value   Sodium 134 (*)    Glucose, Bld 124 (*)     All other components within normal limits  URINALYSIS, ROUTINE W REFLEX MICROSCOPIC - Abnormal; Notable for the following components:   Color, Urine STRAW (*)    APPearance HAZY (*)    Leukocytes,Ua TRACE (*)    Bacteria, UA MANY (*)    All other components within normal limits  LIPASE, BLOOD  CBC    EKG None  Radiology CT Renal Stone Study  Result Date: 05/10/2023 CLINICAL DATA:  Abdominal pain.  Concern for kidney stone. EXAM: CT ABDOMEN AND PELVIS WITHOUT CONTRAST TECHNIQUE: Multidetector CT imaging of the abdomen and pelvis was performed following the standard protocol without IV contrast. RADIATION DOSE REDUCTION: This exam was performed according to the departmental dose-optimization program which includes automated exposure control, adjustment of the mA and/or kV according to patient size and/or use of iterative reconstruction technique. COMPARISON:  CT abdomen pelvis dated 04/08/2007. FINDINGS: Evaluation of this exam is limited in the absence of intravenous contrast. Lower chest: The visualized lung bases are clear. No intra-abdominal free air or free fluid. Hepatobiliary: The liver is unremarkable. No biliary dilatation. The gallbladder is unremarkable. Pancreas: Unremarkable. No pancreatic ductal dilatation or surrounding inflammatory changes. Spleen: Normal in size without focal abnormality. Adrenals/Urinary Tract: The adrenal glands are unremarkable. Nonobstructing bilateral renal calculi measure up to 4 mm in the upper pole of the right kidney. There is no hydronephrosis or obstructing stone. The visualized ureters and urinary bladder appear unremarkable. Stomach/Bowel: Fluid is moderate stool throughout the colon. There is no bowel obstruction or active inflammation. The appendix is normal. Vascular/Lymphatic: Mild aortoiliac atherosclerotic disease. The IVC is unremarkable. No portal venous gas. There is no adenopathy. Reproductive: The uterus is anteverted and grossly unremarkable.  No adnexal masses. Other: None Musculoskeletal: No acute or significant osseous findings. IMPRESSION: 1. Nonobstructing bilateral renal calculi. No hydronephrosis or obstructing stone. 2. No bowel obstruction. Normal appendix. 3.  Aortic Atherosclerosis (ICD10-I70.0). Electronically Signed   By: Elgie Collard M.D.   On: 05/10/2023 02:14    Procedures Procedures    Medications Ordered in ED Medications  ondansetron (ZOFRAN-ODT) disintegrating tablet 4 mg (4 mg Oral Given 05/09/23 2215)  morphine (PF) 4 MG/ML injection 4 mg (4 mg Intravenous Given 05/10/23 0144)  ondansetron (ZOFRAN) injection 4 mg (4 mg Intravenous Given 05/10/23 0144)    ED Course/ Medical Decision Making/ A&P                                 Medical Decision Making Amount and/or Complexity of Data Reviewed Labs: ordered. Radiology: ordered.  Risk Prescription  drug management.   This patient presents to the ED for concern of abdominal pain, nausea, this involves an extensive number of treatment options, and is a complaint that carries with it a high risk of complications and morbidity.  I considered the following differential and admission for this acute, potentially life threatening condition.  The differential diagnosis includes gastritis, gastroenteritis, kidney stone, UTI, less likely cholecystitis or appendicitis given fairly benign exam  MDM:    This is a 52 year old female who presents with abdominal pain and nausea.  She is nontoxic and vital signs are reassuring.  Abrupt onset of pain.  Abdominal exam was fairly benign as she does not have any significant tenderness which could indicate something like a kidney stone.  Labs obtained and are largely reassuring.  CT stone study does not show any evidence of obstructing kidney stones but does show nonobstructing kidney stones.  I reviewed this independently.  She does have a fair amount of stool in her colon.  She could have some mild constipation.  Discussed this  with the patient.  She is able to tolerate fluids.  Will treat supportively with Zofran.  Recommend daily MiraLAX to see if this helps.  (Labs, imaging, consults)  Labs: I Ordered, and personally interpreted labs.  The pertinent results include: CBC, CMP, lipase, urinalysis  Imaging Studies ordered: I ordered imaging studies including CT stone study I independently visualized and interpreted imaging. I agree with the radiologist interpretation  Additional history obtained from chart review.  External records from outside source obtained and reviewed including prior evaluations  Cardiac Monitoring: The patient was maintained on a cardiac monitor.  If on the cardiac monitor, I personally viewed and interpreted the cardiac monitored which showed an underlying rhythm of: Sinus rhythm  Reevaluation: After the interventions noted above, I reevaluated the patient and found that they have :improved  Social Determinants of Health:  lives independently  Disposition: Discharge  Co morbidities that complicate the patient evaluation  Past Medical History:  Diagnosis Date   Abnormal Pap smear of cervix 06/2015   ASCUS    Cervical dysplasia 2017   colposcopic biopsy   Elevated cholesterol    Leukemia (HCC)    age 36, s/p chemotherapy   Migraines    Osteopenia 08/2014   PE (pulmonary thromboembolism) (HCC)    Personal history of venous thrombosis and embolism    Stroke (HCC) 02/06/2019   acute left basal ganglia   Vitamin D deficiency      Medicines Meds ordered this encounter  Medications   ondansetron (ZOFRAN-ODT) disintegrating tablet 4 mg   morphine (PF) 4 MG/ML injection 4 mg   ondansetron (ZOFRAN) injection 4 mg   ondansetron (ZOFRAN-ODT) 4 MG disintegrating tablet    Sig: Take 1 tablet (4 mg total) by mouth every 8 (eight) hours as needed.    Dispense:  20 tablet    Refill:  0    I have reviewed the patients home medicines and have made adjustments as needed  Problem  List / ED Course: Problem List Items Addressed This Visit   None Visit Diagnoses     Generalized abdominal pain    -  Primary                   Final Clinical Impression(s) / ED Diagnoses Final diagnoses:  Generalized abdominal pain    Rx / DC Orders ED Discharge Orders          Ordered    ondansetron (ZOFRAN-ODT)  4 MG disintegrating tablet  Every 8 hours PRN        05/10/23 0253              Shon Baton, MD 05/10/23 [phone]

## 2023-05-10 NOTE — Discharge Instructions (Addendum)
You were seen today for abdominal pain.  The exact cause of your pain is unclear; however, your CT scan does show that you have some stool and you may have some mild constipation.  You may take 1 capful of MiraLAX daily to see if this helps.  You do have kidney stones in your kidney but these are not likely the cause of your pain today.

## 2023-05-10 NOTE — ED Notes (Signed)
Pt tolerating PO fluids

## 2023-05-10 NOTE — ED Notes (Signed)
Discharge instructions provided by edp were discussed with pt. Pt verbalized understanding with no additional questions at this time. Pt to go home with s/o at bedside  

## 2023-06-04 ENCOUNTER — Other Ambulatory Visit: Payer: Self-pay | Admitting: Obstetrics and Gynecology

## 2023-06-04 DIAGNOSIS — Z1231 Encounter for screening mammogram for malignant neoplasm of breast: Secondary | ICD-10-CM

## 2023-06-22 ENCOUNTER — Other Ambulatory Visit: Payer: Self-pay | Admitting: Obstetrics and Gynecology

## 2023-06-22 ENCOUNTER — Other Ambulatory Visit: Payer: Self-pay

## 2023-06-22 MED ORDER — VENLAFAXINE HCL ER 37.5 MG PO CP24
37.5000 mg | ORAL_CAPSULE | Freq: Every day | ORAL | 1 refills | Status: DC
  Filled 2023-06-22: qty 90, 90d supply, fill #0
  Filled 2023-09-17: qty 90, 90d supply, fill #1

## 2023-06-22 NOTE — Telephone Encounter (Signed)
Med refill request: venlafaxine XR 37.5mg  #90 Last AEX: 06/21/22 Next AEX: 10/10/23 Last MMG (if hormonal med) n/a Refill authorized: venlafaxine XR 37.5mg  #90 with 1 refill.  Sent to provider for review.

## 2023-06-25 ENCOUNTER — Ambulatory Visit: Payer: 59 | Admitting: Obstetrics and Gynecology

## 2023-07-26 ENCOUNTER — Other Ambulatory Visit (HOSPITAL_COMMUNITY): Payer: Self-pay

## 2023-07-30 DIAGNOSIS — M25571 Pain in right ankle and joints of right foot: Secondary | ICD-10-CM | POA: Diagnosis not present

## 2023-08-02 ENCOUNTER — Other Ambulatory Visit (HOSPITAL_COMMUNITY): Payer: Self-pay | Admitting: Otolaryngology

## 2023-08-02 DIAGNOSIS — M25571 Pain in right ankle and joints of right foot: Secondary | ICD-10-CM | POA: Diagnosis not present

## 2023-08-02 DIAGNOSIS — M79671 Pain in right foot: Secondary | ICD-10-CM | POA: Diagnosis not present

## 2023-08-03 ENCOUNTER — Ambulatory Visit (HOSPITAL_COMMUNITY)
Admission: RE | Admit: 2023-08-03 | Discharge: 2023-08-03 | Disposition: A | Discharge status: Home / Self Care | Payer: Commercial Managed Care - PPO | Source: Ambulatory Visit | Attending: Otolaryngology | Admitting: Otolaryngology

## 2023-08-03 DIAGNOSIS — M2540 Effusion, unspecified joint: Secondary | ICD-10-CM | POA: Diagnosis not present

## 2023-08-03 DIAGNOSIS — S92021A Displaced fracture of anterior process of right calcaneus, initial encounter for closed fracture: Secondary | ICD-10-CM | POA: Diagnosis not present

## 2023-08-03 DIAGNOSIS — M25571 Pain in right ankle and joints of right foot: Secondary | ICD-10-CM | POA: Insufficient documentation

## 2023-08-06 DIAGNOSIS — S92001A Unspecified fracture of right calcaneus, initial encounter for closed fracture: Secondary | ICD-10-CM | POA: Diagnosis not present

## 2023-08-06 DIAGNOSIS — S92214A Nondisplaced fracture of cuboid bone of right foot, initial encounter for closed fracture: Secondary | ICD-10-CM | POA: Diagnosis not present

## 2023-08-23 DIAGNOSIS — F411 Generalized anxiety disorder: Secondary | ICD-10-CM | POA: Diagnosis not present

## 2023-08-29 DIAGNOSIS — F411 Generalized anxiety disorder: Secondary | ICD-10-CM | POA: Diagnosis not present

## 2023-09-03 DIAGNOSIS — S92001D Unspecified fracture of right calcaneus, subsequent encounter for fracture with routine healing: Secondary | ICD-10-CM | POA: Diagnosis not present

## 2023-09-03 DIAGNOSIS — S92214D Nondisplaced fracture of cuboid bone of right foot, subsequent encounter for fracture with routine healing: Secondary | ICD-10-CM | POA: Diagnosis not present

## 2023-09-03 DIAGNOSIS — F411 Generalized anxiety disorder: Secondary | ICD-10-CM | POA: Diagnosis not present

## 2023-09-07 ENCOUNTER — Ambulatory Visit
Admission: RE | Admit: 2023-09-07 | Discharge: 2023-09-07 | Disposition: A | Discharge status: Home / Self Care | Payer: Commercial Managed Care - PPO | Source: Ambulatory Visit | Attending: Obstetrics and Gynecology | Admitting: Obstetrics and Gynecology

## 2023-09-07 DIAGNOSIS — Z1231 Encounter for screening mammogram for malignant neoplasm of breast: Secondary | ICD-10-CM | POA: Diagnosis not present

## 2023-09-14 ENCOUNTER — Encounter: Payer: Self-pay | Admitting: Obstetrics and Gynecology

## 2023-09-17 ENCOUNTER — Other Ambulatory Visit: Payer: Self-pay

## 2023-09-17 DIAGNOSIS — F411 Generalized anxiety disorder: Secondary | ICD-10-CM | POA: Diagnosis not present

## 2023-09-24 DIAGNOSIS — F411 Generalized anxiety disorder: Secondary | ICD-10-CM | POA: Diagnosis not present

## 2023-09-25 ENCOUNTER — Other Ambulatory Visit (HOSPITAL_COMMUNITY): Payer: Self-pay

## 2023-09-26 NOTE — Progress Notes (Deleted)
 53 y.o. G18P0013 Married Caucasian female here for annual exam.    PCP: Ignatius Specking, MD   Patient's last menstrual period was 12/13/2017.           Sexually active: Yes.    The current method of family planning is post menopausal status.    Menopausal hormone therapy:  n/a Exercising: {yes no:314532}  {types:19826} Smoker:  no  OB History  Gravida Para Term Preterm AB Living  4 3   1 3   SAB IAB Ectopic Multiple Live Births  1        # Outcome Date GA Lbr Len/2nd Weight Sex Type Anes PTL Lv  4 SAB           3 Para           2 Para           1 Para              HEALTH MAINTENANCE: Last 2 paps:  06/21/22 ASCUS: HR HPV neg, 02/10/21 ASCUS: HR HPV neg History of abnormal Pap or positive HPV:  yes, hx of LGSIL Mammogram:   09/07/23 Breast Density Cat B, BI-RADS CAT 1 neg Colonoscopy:  11/23/21 Bone Density:  normal with PCP    Immunization History  Administered Date(s) Administered   Tdap 12/08/2019      reports that she has never smoked. She has never used smokeless tobacco. She reports current alcohol use. She reports that she does not use drugs.  Past Medical History:  Diagnosis Date   Abnormal Pap smear of cervix 06/2015   ASCUS    Cervical dysplasia 2017   colposcopic biopsy   Elevated cholesterol    Leukemia [facility])    age 49, s/p chemotherapy   Migraines    Osteopenia 08/2014   PE (pulmonary thromboembolism) (HCC)    Personal history of venous thrombosis and embolism    Stroke (HCC) 02/06/2019   acute left basal ganglia   Vitamin D deficiency     Past Surgical History:  Procedure Laterality Date   C-sections  2002, 2008   COLONOSCOPY WITH PROPOFOL N/A 11/23/2021   Procedure: COLONOSCOPY WITH PROPOFOL;  Surgeon: Dolores Frame, MD;  Location: [facility];  Service: Gastroenterology;  Laterality: N/A;  730   DILATION AND CURETTAGE OF UTERUS  1997   wisdom teeth extracted      Current Outpatient Medications  Medication Sig Dispense Refill    acetaminophen (TYLENOL) 650 MG CR tablet Take 650 mg by mouth every 8 (eight) hours as needed for pain.     aspirin EC 81 MG EC tablet Take 1 tablet (81 mg total) by mouth daily. 90 tablet 0   fluticasone (FLONASE) 50 MCG/ACT nasal spray Place 1 spray into both nostrils daily as needed for allergies or rhinitis.     methocarbamol (ROBAXIN) 750 MG tablet Take 1 tablet (750 mg total) by mouth 3 (three) times daily as needed (muscle spasm/pain). 15 tablet 0   metoprolol succinate (TOPROL-XL) 25 MG 24 hr tablet take 1 tablet once daily. 90 tablet 3   Misc Natural Products (OSTEO BI-FLEX JOINT SHIELD PO) Take 2 tablets by mouth daily.     Multiple Vitamins-Minerals (MULTIVITAMIN ADULTS PO) Take 1 tablet by mouth daily.     Olopatadine HCl (PATADAY) 0.7 % SOLN Place 1 drop into both eyes daily.     ondansetron (ZOFRAN-ODT) 4 MG disintegrating tablet Take 1 tablet (4 mg total) by mouth every 8 (eight) hours  as needed. 20 tablet 0   venlafaxine XR (EFFEXOR-XR) 37.5 MG 24 hr capsule Take 1 capsule (37.5 mg total) by mouth daily. 90 capsule 1   VITAMIN D-VITAMIN K PO Take 1 capsule by mouth daily.     No current facility-administered medications for this visit.    ALLERGIES: Crestor [rosuvastatin], Lipitor [atorvastatin], Propoxyphene, and Sulfonamide derivatives  Family History  Problem Relation Age of Onset   Stroke Mother    Coronary artery disease Father    Hypertension Father    Hemochromatosis Father    Cancer Father        prostate   Heart attack Father    Breast cancer Maternal Grandmother        13s   Osteoporosis Maternal Grandmother    Alzheimer's disease Maternal Grandmother    Coronary artery disease Paternal Grandmother    Diabetes Paternal Grandmother    Hypertension Paternal Grandmother     Review of Systems  PHYSICAL EXAM:  LMP 12/13/2017     General appearance: alert, cooperative and appears stated age Head: normocephalic, without obvious abnormality,  atraumatic Neck: no adenopathy, supple, symmetrical, trachea midline and thyroid normal to inspection and palpation Lungs: clear to auscultation bilaterally Breasts: normal appearance, no masses or tenderness, No nipple retraction or dimpling, No nipple discharge or bleeding, No axillary adenopathy Heart: regular rate and rhythm Abdomen: soft, non-tender; no masses, no organomegaly Extremities: extremities normal, atraumatic, no cyanosis or edema Skin: skin color, texture, turgor normal. No rashes or lesions Lymph nodes: cervical, supraclavicular, and axillary nodes normal. Neurologic: grossly normal  Pelvic: External genitalia:  no lesions              No abnormal inguinal nodes palpated.              Urethra:  normal appearing urethra with no masses, tenderness or lesions              Bartholins and Skenes: normal                 Vagina: normal appearing vagina with normal color and discharge, no lesions              Cervix: no lesions              Pap taken: {yes no:314532} Bimanual Exam:  Uterus:  normal size, contour, position, consistency, mobility, non-tender              Adnexa: no mass, fullness, tenderness              Rectal exam: {yes no:314532}.  Confirms.              Anus:  normal sphincter tone, no lesions  Chaperone was present for exam:  {BSCHAPERONE:31226::"Emanuele Mcwhirter F, CMA"}  ASSESSMENT: Well woman visit with gynecologic exam  ***  PLAN: Mammogram screening discussed. Self breast awareness reviewed. Pap and HRV collected:  {yes no:314532} Guidelines for Calcium, Vitamin D, regular exercise program including cardiovascular and weight bearing exercise. Medication refills:  *** {LABS (Optional):23779} Follow up:  ***    Additional counseling given.  {yes [id number]. ***  total time was spent for this patient encounter, including preparation, face-to-face counseling with the patient, coordination of care, and documentation of the encounter in addition to doing the  well woman visit with gynecologic exam.

## 2023-10-02 DIAGNOSIS — F411 Generalized anxiety disorder: Secondary | ICD-10-CM | POA: Diagnosis not present

## 2023-10-10 ENCOUNTER — Ambulatory Visit: Payer: 59 | Admitting: Obstetrics and Gynecology

## 2023-10-12 ENCOUNTER — Other Ambulatory Visit: Payer: Self-pay

## 2023-10-17 DIAGNOSIS — F411 Generalized anxiety disorder: Secondary | ICD-10-CM | POA: Diagnosis not present

## 2023-10-24 DIAGNOSIS — F411 Generalized anxiety disorder: Secondary | ICD-10-CM | POA: Diagnosis not present

## 2023-10-29 ENCOUNTER — Encounter: Payer: Self-pay | Admitting: Obstetrics and Gynecology

## 2023-10-29 ENCOUNTER — Ambulatory Visit: Payer: Commercial Managed Care - PPO | Admitting: Obstetrics and Gynecology

## 2023-10-29 ENCOUNTER — Other Ambulatory Visit (HOSPITAL_COMMUNITY): Payer: Self-pay

## 2023-10-29 ENCOUNTER — Other Ambulatory Visit (HOSPITAL_COMMUNITY)
Admission: RE | Admit: 2023-10-29 | Discharge: 2023-10-29 | Disposition: A | Discharge status: Home / Self Care | Payer: Commercial Managed Care - PPO | Source: Ambulatory Visit | Attending: Obstetrics and Gynecology | Admitting: Obstetrics and Gynecology

## 2023-10-29 VITALS — BP 126/82 | HR 71 | Wt 134.0 lb

## 2023-10-29 DIAGNOSIS — Z01419 Encounter for gynecological examination (general) (routine) without abnormal findings: Secondary | ICD-10-CM | POA: Diagnosis not present

## 2023-10-29 DIAGNOSIS — Z5181 Encounter for therapeutic drug level monitoring: Secondary | ICD-10-CM

## 2023-10-29 DIAGNOSIS — Z124 Encounter for screening for malignant neoplasm of cervix: Secondary | ICD-10-CM | POA: Insufficient documentation

## 2023-10-29 DIAGNOSIS — Z1331 Encounter for screening for depression: Secondary | ICD-10-CM

## 2023-10-29 DIAGNOSIS — Z8742 Personal history of other diseases of the female genital tract: Secondary | ICD-10-CM

## 2023-10-29 MED ORDER — VENLAFAXINE HCL ER 37.5 MG PO CP24
37.5000 mg | ORAL_CAPSULE | Freq: Every day | ORAL | 3 refills | Status: AC
  Filled 2023-10-29 – 2023-12-26 (×2): qty 90, 90d supply, fill #0

## 2023-10-29 NOTE — Patient Instructions (Signed)

## 2023-10-29 NOTE — Progress Notes (Signed)
GYNECOLOGY  VISIT   HPI: 53 y.o.   Married  Caucasian female   [phone] with Patient's last menstrual period was 12/13/2017.   here for: annual exam and medication follow up.      Effexor XR is working well overall to treat her menopausal symptoms.  Some hot flashes, but are manageable.  Sleeping well.   No longer has loop recorder.  Is being treated for supraventricular tachycardia.  Takes Toprol.  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  Son is in college and desires a PhD program.  Engaging in contact with her oldest daughter who lives in [place].   GYNECOLOGIC HISTORY: Patient's last menstrual period was 12/13/2017. Contraception:  PMP Menopausal hormone therapy:  n/a Last 2 paps:  06/21/22 ASCUS:HR HPV neg, 02/11/21 ASCUS: HR HPV neg History of abnormal Pap or positive HPV:  yes, hx LGSIL.  Mammogram:  09/07/23 Breast Density Cat B, BI-RADS CAT 1 neg Colonoscopy:  11/23/21:  due in 10 years.         OB History     Gravida  4   Para  3   Term      Preterm      AB  1   Living  3      SAB  1   IAB      Ectopic      Multiple      Live Births                 Patient Active Problem List   Diagnosis Date Noted   Cryptogenic stroke (HCC) 03/18/2019   Palpitations 03/11/2019   Infarction of left basal ganglia (HCC) 02/06/2019   HYPERLIPIDEMIA TYPE I / IV 04/14/2009   PULMONARY EMBOLISM, HX OF 04/03/2009    Past Medical History:  Diagnosis Date   Abnormal Pap smear of cervix 06/2015   ASCUS    Cervical dysplasia 2017   colposcopic biopsy   Elevated cholesterol    Leukemia (HCC)    age 37, s/p chemotherapy   Migraines    Osteopenia 08/2014   PE (pulmonary thromboembolism) (HCC)    Personal history of venous thrombosis and embolism    Stroke (HCC) 02/06/2019   acute left basal ganglia   Vitamin D deficiency      Past Surgical History:  Procedure Laterality Date   C-sections  2002, 2008   COLONOSCOPY WITH PROPOFOL N/A 11/23/2021   Procedure: COLONOSCOPY WITH PROPOFOL;  Surgeon: Dolores Frame, MD;  Location: [facility];  Service: Gastroenterology;  Laterality: N/A;  730   DILATION AND CURETTAGE OF UTERUS  1997   wisdom teeth extracted      Current Outpatient Medications  Medication Sig Dispense Refill   acetaminophen (TYLENOL) 650 MG CR tablet Take 650 mg by mouth every 8 (eight) hours as needed for pain.     aspirin EC 81 MG EC tablet Take 1 tablet (81 mg total) by mouth daily. 90 tablet 0   fluticasone (FLONASE) 50 MCG/ACT nasal spray Place 1 spray into both nostrils daily as needed for allergies or rhinitis.     meloxicam (MOBIC) 7.5 MG tablet Take 7.5 mg by mouth daily.     metoprolol succinate (TOPROL-XL) 25 MG 24 hr tablet take 1 tablet once daily. 90 tablet 3   Misc Natural Products (OSTEO BI-FLEX JOINT SHIELD PO) Take  2 tablets by mouth daily.     Multiple Vitamins-Minerals (MULTIVITAMIN ADULTS PO) Take 1 tablet by mouth daily.     Olopatadine HCl (PATADAY) 0.7 % SOLN Place 1 drop into both eyes daily.     venlafaxine XR (EFFEXOR-XR) 37.5 MG 24 hr capsule Take 1 capsule (37.5 mg total) by mouth daily. 90 capsule 1   VITAMIN D-VITAMIN K PO Take 1 capsule by mouth daily.     No current facility-administered medications for this visit.     ALLERGIES: Crestor [rosuvastatin], Lipitor [atorvastatin], Propoxyphene, and Sulfonamide derivatives  Family History  Problem Relation Age of Onset   Stroke Mother    Coronary artery disease Father    Hypertension Father    Hemochromatosis Father    Cancer Father        prostate   Heart attack Father    Breast cancer Maternal Grandmother        15s   Osteoporosis Maternal Grandmother    Alzheimer's disease Maternal Grandmother    Coronary artery disease Paternal Grandmother    Diabetes Paternal Grandmother    Hypertension  Paternal Grandmother     Social History   Socioeconomic History   Marital status: Married    Spouse name: Not on file   Number of children: Not on file   Years of education: Not on file   Highest education level: Not on file  Occupational History   Occupation: Charity fundraiser    Employer: [employer]    Comment: [employer]  Tobacco Use   Smoking status: Never   Smokeless tobacco: Never  Vaping Use   Vaping status: Never Used  Substance and Sexual Activity   Alcohol use: Yes    Alcohol/week: 0.0 standard drinks of alcohol    Comment: rare wine   Drug use: No   Sexual activity: Yes    Partners: Male    Birth control/protection: Post-menopausal  Other Topics Concern   Not on file  Social History Narrative   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    Social Drivers of Corporate investment banker Strain: Not on file  Food Insecurity: Not on file  Transportation Needs: Not on file  Physical Activity: Not on file  Stress: Not on file  Social Connections: Not on file  Intimate Partner Violence: Not on file    Review of Systems  All other systems reviewed and are negative.   PHYSICAL EXAMINATION:   BP 126/82 (BP Location: Right Arm, Patient Position: Sitting, Cuff Size: Small)   Pulse 71   Wt 134 lb (60.8 kg)   LMP 12/13/2017   SpO2 96%   BMI 23.00 kg/m     General appearance: alert, cooperative and appears stated age Head: Normocephalic, without obvious abnormality, atraumatic Neck: no adenopathy, supple, symmetrical, trachea midline and thyroid normal to inspection and palpation Lungs: clear to auscultation bilaterally Breasts: normal appearance, no masses or tenderness, No nipple retraction or dimpling, No nipple discharge or bleeding, No axillary or supraclavicular adenopathy Heart: regular rate and rhythm Abdomen: soft, non-tender, no masses,  no organomegaly Extremities: extremities normal, atraumatic, no cyanosis or edema Skin:  Skin color, texture, turgor normal. No rashes or lesions Lymph nodes: Cervical, supraclavicular, and axillary nodes normal. No abnormal inguinal nodes palpated Neurologic: Grossly normal  Pelvic: External genitalia:  no lesions              Urethra:  normal appearing urethra with no masses, tenderness or lesions  Bartholins and Skenes: normal                 Vagina: normal appearing vagina with normal color and discharge, no lesions              Cervix: no lesions                Bimanual Exam:  Uterus:  normal size, contour, position, consistency, mobility, non-tender              Adnexa: no mass, fullness, tenderness              Rectal exam: yes.  Confirms.              Anus:  normal sphincter tone, no lesions  Chaperone was present for exam:  Warren Lacy, CMA  ASSESSMENT:  Menopausal symptoms.  Controlled on Effexor.  Hx PE.  Hx cryptogenic stroke. SVT.  On Metoprolol. 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  Hx stress incontinence and overactive bladder.  PHQ2:  0  PLAN:  Yearly mammogram recommended.  Self breast exam reviewed.  Pap and HR HPV collected.  Refill of Effexor XR for one year. We discussed the benefits of Effexor to treat hot flashes and night sweats and anxiety and depression.   We reviewed that she is not a good candidate for HRT due to her hx of PE and stroke.  Calcium and vit D importance reviewed.  Exercise guidelines provided.  Follow up yearly and prn.

## 2023-10-30 ENCOUNTER — Other Ambulatory Visit (HOSPITAL_COMMUNITY): Payer: Self-pay

## 2023-11-01 DIAGNOSIS — F411 Generalized anxiety disorder: Secondary | ICD-10-CM | POA: Diagnosis not present

## 2023-11-02 LAB — CYTOLOGY - PAP
Comment: NEGATIVE
High risk HPV: NEGATIVE

## 2023-11-06 ENCOUNTER — Encounter: Payer: Self-pay | Admitting: Obstetrics and Gynecology

## 2023-11-06 ENCOUNTER — Other Ambulatory Visit: Payer: Self-pay | Admitting: Obstetrics and Gynecology

## 2023-11-08 DIAGNOSIS — F411 Generalized anxiety disorder: Secondary | ICD-10-CM | POA: Diagnosis not present

## 2023-11-12 ENCOUNTER — Other Ambulatory Visit: Payer: Self-pay

## 2023-11-12 DIAGNOSIS — S92001A Unspecified fracture of right calcaneus, initial encounter for closed fracture: Secondary | ICD-10-CM | POA: Diagnosis not present

## 2023-11-12 DIAGNOSIS — S92214A Nondisplaced fracture of cuboid bone of right foot, initial encounter for closed fracture: Secondary | ICD-10-CM | POA: Diagnosis not present

## 2023-11-12 DIAGNOSIS — N879 Dysplasia of cervix uteri, unspecified: Secondary | ICD-10-CM

## 2023-11-15 DIAGNOSIS — F411 Generalized anxiety disorder: Secondary | ICD-10-CM | POA: Diagnosis not present

## 2023-11-20 DIAGNOSIS — E7800 Pure hypercholesterolemia, unspecified: Secondary | ICD-10-CM | POA: Diagnosis not present

## 2023-11-20 DIAGNOSIS — Z299 Encounter for prophylactic measures, unspecified: Secondary | ICD-10-CM | POA: Diagnosis not present

## 2023-11-20 DIAGNOSIS — F411 Generalized anxiety disorder: Secondary | ICD-10-CM | POA: Diagnosis not present

## 2023-11-20 DIAGNOSIS — Z6822 Body mass index (BMI) 22.0-22.9, adult: Secondary | ICD-10-CM | POA: Diagnosis not present

## 2023-11-20 DIAGNOSIS — R4189 Other symptoms and signs involving cognitive functions and awareness: Secondary | ICD-10-CM | POA: Diagnosis not present

## 2023-11-27 ENCOUNTER — Telehealth: Payer: Self-pay | Admitting: *Deleted

## 2023-11-27 DIAGNOSIS — R6882 Decreased libido: Secondary | ICD-10-CM

## 2023-11-27 DIAGNOSIS — N951 Menopausal and female climacteric states: Secondary | ICD-10-CM

## 2023-11-27 NOTE — Telephone Encounter (Signed)
 Patient left message requesting Dr. Edward Jolly to order labs for her. Requesting call back.

## 2023-11-28 NOTE — Telephone Encounter (Signed)
 Pt reports that she would like to have FSH, testosterone, progesterone, and estrogen levels done.   Pt reports that she would like to have these done prior to colpo on 12/12/2023. So, that results could be discussed.   Pt reports that she desires these labs due to desiring to consider HRT.  If labs could be done, she would like to have them drawn at the [facility] in [place].   Please advise.

## 2023-11-28 NOTE — Progress Notes (Signed)
 GYNECOLOGY  VISIT   HPI: 53 y.o.   Married  Caucasian female   [phone] with Patient's last menstrual period was 12/13/2017.   here for: colpo for epithalial atypia.       Has brain fog and memory changes.  Hx stroke a while ago and feels her mentation changes are not due to the stroke at this point.   Decreased libido.  Taking saffron and vitamin which helps with libido.  Hormone labs done with PCP 12/03/23:  Progesterone 0.1, FSH 22.2, estradiol <5.0,free T 1.1, testosterone <3.   GYNECOLOGIC HISTORY: Patient's last menstrual period was 12/13/2017. Contraception:  PMP Menopausal hormone therapy:  n/a Last 2 paps: 10/29/23 atrophic patter with epithelial atypia, HR HPV negative, 06/21/22 ASCUS:HR HPV neg,  History of abnormal Pap or positive HPV:  yes, HX OF LGSIL Mammogram:  09/07/23 Breast Density Cat B, BI-RADS CAT 1 neg         OB History     Gravida  4   Para  3   Term      Preterm      AB  1   Living  3      SAB  1   IAB      Ectopic      Multiple      Live Births                 Patient Active Problem List   Diagnosis Date Noted   Cryptogenic stroke (HCC) 03/18/2019   Palpitations 03/11/2019   Infarction of left basal ganglia (HCC) 02/06/2019   HYPERLIPIDEMIA TYPE I / IV 04/14/2009   PULMONARY EMBOLISM, HX OF 04/03/2009    Past Medical History:  Diagnosis Date   Abnormal Pap smear of cervix 06/2015   ASCUS    Cervical dysplasia 2017   colposcopic biopsy   Elevated cholesterol    Leukemia (HCC)    age 33, s/p chemotherapy   Migraines    Osteopenia 08/2014   PE (pulmonary thromboembolism) (HCC)    Personal history of venous thrombosis and embolism    Stroke (HCC) 02/06/2019   acute left basal ganglia   Vitamin D deficiency     Past Surgical History:  Procedure Laterality Date   C-sections  2002, 2008   COLONOSCOPY WITH PROPOFOL N/A 11/23/2021   Procedure: COLONOSCOPY WITH PROPOFOL;  Surgeon: Dolores Frame, MD;   [facility];  Service: Gastroenterology;  Laterality: N/A;  730   DILATION AND CURETTAGE OF UTERUS  1997   wisdom teeth extracted      Current Outpatient Medications  Medication Sig Dispense Refill   acetaminophen (TYLENOL) 650 MG CR tablet Take 650 mg by mouth every 8 (eight) hours as needed for pain.     aspirin EC 81 MG EC tablet Take 1 tablet (81 mg total) by mouth daily. 90 tablet 0   fluticasone (FLONASE) 50 MCG/ACT nasal spray Place 1 spray into both nostrils daily as needed for allergies or rhinitis.     meloxicam (MOBIC) 7.5 MG tablet Take 7.5 mg by mouth daily.     metoprolol succinate (TOPROL-XL) 25 MG 24 hr tablet take 1 tablet once daily. 90 tablet 3   Misc Natural Products (OSTEO BI-FLEX JOINT SHIELD PO) Take 2 tablets by mouth daily.     Multiple Vitamins-Minerals (MULTIVITAMIN ADULTS PO) Take 1 tablet by mouth daily.     Olopatadine HCl (PATADAY) 0.7 % SOLN Place 1 drop into both eyes daily.  venlafaxine XR (EFFEXOR-XR) 37.5 MG 24 hr capsule Take 1 capsule (37.5 mg total) by mouth daily. 90 capsule 3   VITAMIN D-VITAMIN K PO Take 1 capsule by mouth daily.     No current facility-administered medications for this visit.     ALLERGIES: Crestor [rosuvastatin], Lipitor [atorvastatin], Propoxyphene, and Sulfonamide derivatives  Family History  Problem Relation Age of Onset   Stroke Mother    Coronary artery disease Father    Hypertension Father    Hemochromatosis Father    Cancer Father        prostate   Heart attack Father    Breast cancer Maternal Grandmother        33s   Osteoporosis Maternal Grandmother    Alzheimer's disease Maternal Grandmother    Coronary artery disease Paternal Grandmother    Diabetes Paternal Grandmother    Hypertension Paternal Grandmother     Social History   Socioeconomic History   Marital status: Married    Spouse name: Not on file   Number of children: Not on file   Years of education: Not on file   Highest  education level: Not on file  Occupational History   Occupation: Charity fundraiser    Employer: [employer]    Comment: [employer] and [employer]  Tobacco Use   Smoking status: Never   Smokeless tobacco: Never  Vaping Use   Vaping status: Never Used  Substance and Sexual Activity   Alcohol use: Yes    Alcohol/week: 0.0 standard drinks of alcohol    Comment: rare wine   Drug use: No   Sexual activity: Yes    Partners: Male    Birth control/protection: Post-menopausal  Other Topics Concern   Not on file  Social History Narrative   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    Social Drivers of Corporate investment banker Strain: Not on file  Food Insecurity: Not on file  Transportation Needs: Not on file  Physical Activity: Not on file  Stress: Not on file  Social Connections: Not on file  Intimate Partner Violence: Not on file    Review of Systems  All other systems reviewed and are negative.   PHYSICAL EXAMINATION:   BP 124/82 (BP Location: Left Arm, Patient Position: Sitting, Cuff Size: Normal)   Pulse 60   Ht 5' 4.75" (1.645 m)   Wt 133 lb (60.3 kg)   LMP 12/13/2017   SpO2 95%   BMI 22.30 kg/m     General appearance: alert, cooperative and appears stated age   Colposcopy - cervix, vagina,. Consent for procedure.  Time out done.  3% acetic acid used in vagina and on vulva. White light and green light filter used.  Colposcopy satisfactory:  Yes   __x___          No    _____ Findings:   atrophy of the cervix and the vagina.  No acetowhite lesions.  Biopsies:  ECC and biopsy at 1:00/  Monsel's placed.  Minimal EBL. No complications.   Chaperone was present for exam:  Edwin Dada, CMA  ASSESSMENT:  Pap showing cervical atypia.  Atrophy.  Menopausal symptoms.  Postmenopausal female.  Hx stroke and prior PE.  Not a candidate for HRT.   PLAN:  Labs reviewed.  Coconut oil, Replens, vag vit E discussed as options to treat atrophy.  I reviewed  Avlimil as a menopausal supplement.  Avoid HRT and testosterone due to risk of thromboembolic events.   FU biopsies.  Anticipate pap and HR HPV in one year.    10 min  total time was spent for this patient encounter, including preparation, face-to-face counseling with the patient, coordination of care, and documentation of the encounter regarding menopausal symptoms and treatment options in addition to doing the colposcopy with biopsies.

## 2023-11-28 NOTE — Telephone Encounter (Signed)
 Lab orders pended for [facility].   Dr. Edward Jolly -please confirm Dx

## 2023-11-28 NOTE — Telephone Encounter (Signed)
 I signed the orders.  Diagnoses are menopausal symptoms and decreased libido.   Happy to help.

## 2023-11-28 NOTE — Telephone Encounter (Signed)
 I am happy to order the [facility], estradiol, progesterone, and testosterone levels if she would like.   I do not recommend any hormone therapy as she has had a pulmonary embolus and a stroke.   Hormonal treatment increases the risk of stroke, heart attack, pulmonary embolus, deep venous thrombosis, and breast cancer.

## 2023-11-28 NOTE — Telephone Encounter (Signed)
 Lab orders released.   Spoke with patient, advised per Dr. Edward Jolly. Advised labs have been placed and released, patient will need to contact [facility] location of choice to confirm if appt is needed and Labs available in [facility] system. Patient aware to contact the office if she needs any additional assistance.l    Encounter closed.

## 2023-11-29 DIAGNOSIS — F411 Generalized anxiety disorder: Secondary | ICD-10-CM | POA: Diagnosis not present

## 2023-12-03 DIAGNOSIS — N951 Menopausal and female climacteric states: Secondary | ICD-10-CM | POA: Diagnosis not present

## 2023-12-03 DIAGNOSIS — R6882 Decreased libido: Secondary | ICD-10-CM | POA: Diagnosis not present

## 2023-12-10 DIAGNOSIS — F411 Generalized anxiety disorder: Secondary | ICD-10-CM | POA: Diagnosis not present

## 2023-12-12 ENCOUNTER — Encounter: Payer: Self-pay | Admitting: Obstetrics and Gynecology

## 2023-12-12 ENCOUNTER — Other Ambulatory Visit (HOSPITAL_COMMUNITY)
Admission: RE | Admit: 2023-12-12 | Discharge: 2023-12-12 | Disposition: A | Discharge status: Home / Self Care | Source: Ambulatory Visit | Attending: Obstetrics and Gynecology | Admitting: Obstetrics and Gynecology

## 2023-12-12 ENCOUNTER — Ambulatory Visit (INDEPENDENT_AMBULATORY_CARE_PROVIDER_SITE_OTHER): Admitting: Obstetrics and Gynecology

## 2023-12-12 VITALS — BP 124/82 | HR 60 | Ht 64.75 in | Wt 133.0 lb

## 2023-12-12 DIAGNOSIS — Z01812 Encounter for preprocedural laboratory examination: Secondary | ICD-10-CM | POA: Diagnosis not present

## 2023-12-12 DIAGNOSIS — N72 Inflammatory disease of cervix uteri: Secondary | ICD-10-CM | POA: Diagnosis not present

## 2023-12-12 DIAGNOSIS — N879 Dysplasia of cervix uteri, unspecified: Secondary | ICD-10-CM | POA: Insufficient documentation

## 2023-12-12 NOTE — Patient Instructions (Signed)
 Colposcopy, Care After  The following information offers guidance on how to care for yourself after your procedure. Your health care provider may also give you more specific instructions. If you have problems or questions, contact your health care provider. What can I expect after the procedure? If you had a colposcopy without a biopsy, you can expect to feel fine right away after your procedure. However, you may have some spotting of blood for a few days. You can return to your normal activities. If you had a colposcopy with a biopsy, it is common after the procedure to have: Soreness and mild pain. These may last for a few days. Mild vaginal bleeding or discharge that is dark-colored and grainy. This may last for a few days. The discharge may be caused by a liquid (solution) that was used during the procedure. You may need to wear a sanitary pad during this time. Spotting of blood for at least 48 hours after the procedure. Follow these instructions at home: Medicines Take over-the-counter and prescription medicines only as told by your health care provider. Talk with your health care provider about what type of over-the-counter pain medicines and prescription medicines you can start to take again. It is especially important to talk with your health care provider if you take blood thinners. Activity Avoid using douche products, using tampons, and having sex for at least 3 days after the procedure or for as long as told by your health care provider. Return to your normal activities as told by your health care provider. Ask your health care provider what activities are safe for you. General instructions Ask your health care provider if you may take baths, swim, or use a hot tub. You may take showers. If you use birth control (contraception), continue to use it. Keep all follow-up visits. This is important. Contact a health care provider if: You have a fever or chills. You faint or feel  light-headed. Get help right away if: You have heavy bleeding from your vagina or pass blood clots. Heavy bleeding is bleeding that soaks through a sanitary pad in less than 1 hour. You have vaginal discharge that is abnormal, is yellow in color, or smells bad. This could be a sign of infection. You have severe pain or cramps in your lower abdomen that do not go away with medicine. Summary If you had a colposcopy without a biopsy, you can expect to feel fine right away, but you may have some spotting of blood for a few days. You can return to your normal activities. If you had a colposcopy with a biopsy, it is common to have mild pain for a few days and spotting for 48 hours after the procedure. Avoid using douche products, using tampons, and having sex for at least 3 days after the procedure or for as long as told by your health care provider. Get help right away if you have heavy bleeding, severe pain, or signs of infection. This information is not intended to replace advice given to you by your health care provider. Make sure you discuss any questions you have with your health care provider. Document Revised: 01/23/2021 Document Reviewed: 01/23/2021 Elsevier Patient Education  2024 [employer].

## 2023-12-14 LAB — SURGICAL PATHOLOGY

## 2023-12-15 ENCOUNTER — Encounter: Payer: Self-pay | Admitting: Obstetrics and Gynecology

## 2023-12-18 DIAGNOSIS — F411 Generalized anxiety disorder: Secondary | ICD-10-CM | POA: Diagnosis not present

## 2023-12-26 ENCOUNTER — Other Ambulatory Visit: Payer: Self-pay

## 2023-12-26 ENCOUNTER — Other Ambulatory Visit (HOSPITAL_COMMUNITY): Payer: Self-pay

## 2023-12-27 DIAGNOSIS — F411 Generalized anxiety disorder: Secondary | ICD-10-CM | POA: Diagnosis not present

## 2024-01-03 ENCOUNTER — Emergency Department (HOSPITAL_COMMUNITY)

## 2024-01-03 ENCOUNTER — Emergency Department (HOSPITAL_COMMUNITY)
Admission: EM | Admit: 2024-01-03 | Discharge: 2024-01-03 | Disposition: A | Discharge status: Home / Self Care | Attending: Emergency Medicine | Admitting: Emergency Medicine

## 2024-01-03 ENCOUNTER — Other Ambulatory Visit: Payer: Self-pay

## 2024-01-03 DIAGNOSIS — F411 Generalized anxiety disorder: Secondary | ICD-10-CM | POA: Diagnosis not present

## 2024-01-03 DIAGNOSIS — Z7982 Long term (current) use of aspirin: Secondary | ICD-10-CM | POA: Diagnosis not present

## 2024-01-03 DIAGNOSIS — R079 Chest pain, unspecified: Secondary | ICD-10-CM | POA: Diagnosis not present

## 2024-01-03 DIAGNOSIS — R55 Syncope and collapse: Secondary | ICD-10-CM | POA: Diagnosis not present

## 2024-01-03 LAB — COMPREHENSIVE METABOLIC PANEL WITH GFR
ALT: 16 U/L (ref 0–44)
AST: 18 U/L (ref 15–41)
Albumin: 4 g/dL (ref 3.5–5.0)
Alkaline Phosphatase: 68 U/L (ref 38–126)
Anion gap: 7 (ref 5–15)
BUN: 33 mg/dL — ABNORMAL HIGH (ref 6–20)
CO2: 27 mmol/L (ref 22–32)
Calcium: 10.4 mg/dL — ABNORMAL HIGH (ref 8.9–10.3)
Chloride: 101 mmol/L (ref 98–111)
Creatinine, Ser: 0.66 mg/dL (ref 0.44–1.00)
GFR, Estimated: >60 mL/min (ref >60)
Glucose, Bld: 125 mg/dL — ABNORMAL HIGH (ref 70–99)
Potassium: 3.8 mmol/L (ref 3.5–5.1)
Sodium: 135 mmol/L (ref 135–145)
Total Bilirubin: 0.6 mg/dL (ref 0.0–1.2)
Total Protein: 6.6 g/dL (ref 6.5–8.1)

## 2024-01-03 LAB — CBC WITH DIFFERENTIAL/PLATELET
Abs Immature Granulocytes: 0.02 10*3/uL (ref 0.00–0.07)
Basophils Absolute: 0.1 10*3/uL (ref 0.0–0.1)
Basophils Relative: 1 %
Eosinophils Absolute: 0.1 10*3/uL (ref 0.0–0.5)
Eosinophils Relative: 1 %
HCT: 39 % (ref 36.0–46.0)
Hemoglobin: 13.1 g/dL (ref 12.0–15.0)
Immature Granulocytes: 0 %
Lymphocytes Relative: 20 %
Lymphs Abs: 1.6 10*3/uL (ref 0.7–4.0)
MCH: 32.1 pg (ref 26.0–34.0)
MCHC: 33.6 g/dL (ref 30.0–36.0)
MCV: 95.6 fL (ref 80.0–100.0)
Monocytes Absolute: 0.8 10*3/uL (ref 0.1–1.0)
Monocytes Relative: 11 %
Neutro Abs: 5.3 10*3/uL (ref 1.7–7.7)
Neutrophils Relative %: 67 %
Platelets: 225 10*3/uL (ref 150–400)
RBC: 4.08 MIL/uL (ref 3.87–5.11)
RDW: 12.2 % (ref 11.5–15.5)
WBC: 7.8 10*3/uL (ref 4.0–10.5)
nRBC: 0 % (ref 0.0–0.2)

## 2024-01-03 LAB — CBG MONITORING, ED: Glucose-Capillary: 96 mg/dL (ref 70–99)

## 2024-01-03 LAB — T4, FREE: Free T4: 1 ng/dL (ref 0.61–1.12)

## 2024-01-03 LAB — TSH: TSH: 1.173 u[IU]/mL (ref 0.350–4.500)

## 2024-01-03 MED ORDER — SODIUM CHLORIDE 0.9 % IV BOLUS
1000.0000 mL | Freq: Once | INTRAVENOUS | Status: AC
  Administered 2024-01-03: 1000 mL via INTRAVENOUS

## 2024-01-03 MED ORDER — SODIUM CHLORIDE 0.9 % IV SOLN: INTRAVENOUS | Status: DC

## 2024-01-03 NOTE — Discharge Instructions (Signed)
 As discussed, your evaluation today has been largely reassuring.  But, it is important that you monitor your condition carefully, and do not hesitate to return to the ED if you develop new, or concerning changes in your condition.  Your cardiology team should contact you for a visit in the coming days.

## 2024-01-03 NOTE — ED Provider Notes (Signed)
 [facility] EMERGENCY DEPARTMENT AT [facility] Provider Note   CSN: [id number] Arrival date & time: 01/03/24  1219     History  Chief Complaint  Patient presents with   Hypotension    Judy Walter is a 53 y.o. female.  HPI Patient presents after an episode of near syncope.  Patient does have history of SVT, takes her metoprolol  inconsistently.  However, this episode does not feel like prior episodes of arrhythmia.  She was at work, standing, when she had sudden onset nausea, lightheadedness.  On checking her blood pressure found systolic 80.  She sat down, drink something, and liver somewhat.  No pain throughout, no focal weakness throughout no complete syncope, no fall.  She is essentially back to baseline.    Home Medications Prior to Admission medications   Medication Sig Start Date End Date Taking? Authorizing Provider  acetaminophen  (TYLENOL ) 650 MG CR tablet Take 650 mg by mouth every 8 (eight) hours as needed for pain.    [provider]  aspirin  EC 81 MG EC tablet Take 1 tablet (81 mg total) by mouth daily. 02/09/19   Nettey, Shayla D, MD  fluticasone (FLONASE) 50 MCG/ACT nasal spray Place 1 spray into both nostrils daily as needed for allergies or rhinitis.    [provider]  meloxicam (MOBIC) 7.5 MG tablet Take 7.5 mg by mouth daily. 07/30/23   [provider]  metoprolol  succinate (TOPROL -XL) 25 MG 24 hr tablet take 1 tablet once daily. 02/14/23   Tammie Fall, MD  Misc Natural Products (OSTEO BI-FLEX JOINT SHIELD PO) Take 2 tablets by mouth daily.    [provider]  Multiple Vitamins-Minerals (MULTIVITAMIN ADULTS PO) Take 1 tablet by mouth daily.    [provider]  Olopatadine HCl (PATADAY) 0.7 % SOLN Place 1 drop into both eyes daily.    [provider]  venlafaxine  XR (EFFEXOR -XR) 37.5 MG 24 hr capsule Take 1 capsule (37.5 mg total) by mouth daily. 10/29/23   Amundson C Silva, Brook E, MD  VITAMIN  D-VITAMIN K PO Take 1 capsule by mouth daily.    [provider]      Allergies    Crestor  [rosuvastatin ], Lipitor [atorvastatin ], Propoxyphene, and Sulfonamide derivatives    Review of Systems   Review of Systems  Physical Exam Updated Vital Signs BP 135/87   Pulse (!) 50   Temp 97.8 F (36.6 C) (Oral)   Resp 12   Ht 5\' 4"  (1.626 m)   Wt 59 kg   LMP 12/13/2017   SpO2 100%   BMI 22.31 kg/m  Physical Exam Vitals and nursing note reviewed.  Constitutional:      General: She is not in acute distress.    Appearance: She is well-developed.  HENT:     Head: Normocephalic and atraumatic.  Eyes:     Conjunctiva/sclera: Conjunctivae normal.  Cardiovascular:     Rate and Rhythm: Normal rate and regular rhythm.  Pulmonary:     Effort: Pulmonary effort is normal. No respiratory distress.     Breath sounds: Normal breath sounds. No stridor.  Abdominal:     General: There is no distension.  Skin:    General: Skin is warm and dry.  Neurological:     Mental Status: She is alert and oriented to person, place, and time.     Cranial Nerves: No cranial nerve deficit.  Psychiatric:        Mood and Affect: Mood normal.  ED Results / Procedures / Treatments   Labs (all labs ordered are listed, but only abnormal results are displayed) Labs Reviewed  COMPREHENSIVE METABOLIC PANEL WITH GFR - Abnormal; Notable for the following components:      Result Value   Glucose, Bld 125 (*)    BUN 33 (*)    Calcium  10.4 (*)    All other components within normal limits  CBC WITH DIFFERENTIAL/PLATELET  TSH  T4, FREE  CBG MONITORING, ED    EKG None  Radiology DG Chest Port 1 View Result Date: 01/03/2024 CLINICAL DATA:  Chest pain EXAM: PORTABLE CHEST 1 VIEW COMPARISON:  June 01, 2022 FINDINGS: The heart size and mediastinal contours are within normal limits. Both lungs are clear. The visualized skeletal structures are unremarkable. IMPRESSION: No active disease.  Electronically Signed   By: Fredrich Jefferson M.D.   On: 01/03/2024 14:27    Procedures Procedures    Medications Ordered in ED Medications  sodium chloride  0.9 % bolus 1,000 mL (0 mLs Intravenous Stopped 01/03/24 1539)    And  0.9 %  sodium chloride  infusion ( Intravenous New Bag/Given 01/03/24 1318)    ED Course/ Medical Decision Making/ A&P                                 Medical Decision Making Adult female with a history of SVT, but generally well presents after episode of near syncope, nausea, now with persistent mild bradycardia, 40s, 50s.  She is awake, alert, blood pressure is improved.  With consideration of bradycardia, suspicion for medication related changes versus intrinsic arrhythmia less likely infection, stroke. Cardiac 4555 sinus bradycardia abnormal Pulse ox 100% room air normal  Amount and/or Complexity of Data Reviewed External Data Reviewed: notes. Labs: ordered. Decision-making details documented in ED Course. Radiology: ordered and independent interpretation performed. Decision-making details documented in ED Course. ECG/medicine tests: ordered and independent interpretation performed. Decision-making details documented in ED Course.  Risk Prescription drug management.   Aaron Aas3:57 PM Patient awake, alert, vital signs now unremarkable, she has remained in sinus rhythm for hours of monitoring, labs reviewed, unremarkable T4 is pending, with normal TSH, low suspicion for substantial abnormality she will follow this as an outpatient.  She and I discussed her presentation, considerations including not recorded arrhythmia, iatrogenic manifestations of her inconsistent beta-blocker dosing, or other occult process. We discussed admission versus close outpatient follow-up and the patient is amenable to this latter option.       Final Clinical Impression(s) / ED Diagnoses Final diagnoses:  Near syncope    Rx / DC Orders ED Discharge Orders          Ordered     Ambulatory referral to Cardiology       Comments: If you have not heard from the Cardiology office within the next 72 hours please call [phone].   01/03/24 1557              Dorenda Gandy, MD 01/03/24 1557

## 2024-01-03 NOTE — ED Triage Notes (Signed)
 Patient was upstairs on her shift and became weak, nauseated, dizzy and lightheaded. She did not fall or pass out or LOC. Staff checked her BP and got 80/53 patient drank electrolytes and BP increased to 90/70. She is A&O and states this has happened before HX of SVT. Denies pain at this time.

## 2024-01-04 ENCOUNTER — Telehealth: Payer: Self-pay | Admitting: Internal Medicine

## 2024-01-04 NOTE — Telephone Encounter (Signed)
 We received [employer] disability form today.  I called patient and she agreed that we are to complete the form.  She's coming in on 04/29 for an OV with Dr. Carolynne Citron and said that she will sign release and pay the $29 form fee then.  I will put the form in Dr. Meridith Stanford box today and keep the release with me.

## 2024-01-08 ENCOUNTER — Encounter: Payer: Self-pay | Admitting: Internal Medicine

## 2024-01-08 ENCOUNTER — Ambulatory Visit: Attending: Internal Medicine | Admitting: Internal Medicine

## 2024-01-08 VITALS — BP 136/82 | HR 76 | Ht 64.0 in | Wt 131.0 lb

## 2024-01-08 DIAGNOSIS — F411 Generalized anxiety disorder: Secondary | ICD-10-CM | POA: Diagnosis not present

## 2024-01-08 DIAGNOSIS — Z0279 Encounter for issue of other medical certificate: Secondary | ICD-10-CM

## 2024-01-08 DIAGNOSIS — R55 Syncope and collapse: Secondary | ICD-10-CM | POA: Diagnosis not present

## 2024-01-08 NOTE — Progress Notes (Signed)
 HPI Judy Walter returns today for followup of a cryptogenic stroke. She underwent ILR insertion and then removal several months ago. She has done well in the interim with no neuro symptoms, chest pain. She has brief palpitations. She was exercising but stopped. She has had documented SVT of over 200/min. But no atrial fib. She was in her usual state of health and had what she describes as a fairly classic vagal spell which was not associated with palpitations. She felt nauseated and weak for hours after the episode. Allergies  Allergen Reactions   Crestor  [Rosuvastatin ]     Muscle aches and pains   Lipitor [Atorvastatin ]     Muscle pain   Propoxyphene     Darvocet - hallucinations    Sulfonamide Derivatives     fever     Current Outpatient Medications  Medication Sig Dispense Refill   acetaminophen  (TYLENOL ) 650 MG CR tablet Take 650 mg by mouth every 8 (eight) hours as needed for pain.     aspirin  EC 81 MG EC tablet Take 1 tablet (81 mg total) by mouth daily. 90 tablet 0   fluticasone (FLONASE) 50 MCG/ACT nasal spray Place 1 spray into both nostrils daily as needed for allergies or rhinitis.     meloxicam (MOBIC) 7.5 MG tablet Take 7.5 mg by mouth daily.     metoprolol  succinate (TOPROL -XL) 25 MG 24 hr tablet take 1 tablet once daily. 90 tablet 3   Misc Natural Products (OSTEO BI-FLEX JOINT SHIELD PO) Take 2 tablets by mouth daily.     Multiple Vitamins-Minerals (MULTIVITAMIN ADULTS PO) Take 1 tablet by mouth daily.     Olopatadine HCl (PATADAY) 0.7 % SOLN Place 1 drop into both eyes daily.     venlafaxine  XR (EFFEXOR -XR) 37.5 MG 24 hr capsule Take 1 capsule (37.5 mg total) by mouth daily. 90 capsule 3   VITAMIN D-VITAMIN K PO Take 1 capsule by mouth daily.     No current facility-administered medications for this visit.     Past Medical History:  Diagnosis Date   Abnormal Pap smear of cervix 06/2015   ASCUS    Cervical dysplasia 2017   colposcopic biopsy    Elevated cholesterol    Leukemia [facility])    age 46, s/p chemotherapy   Migraines    Osteopenia 08/2014   PE (pulmonary thromboembolism) (HCC)    Personal history of venous thrombosis and embolism    Stroke (HCC) 02/06/2019   acute left basal ganglia   Vitamin D deficiency     ROS:   All systems reviewed and negative except as noted in the HPI.   Past Surgical History:  Procedure Laterality Date   C-sections  2002, 2008   COLONOSCOPY WITH PROPOFOL  N/A 11/23/2021   Procedure: COLONOSCOPY WITH PROPOFOL ;  Surgeon: Urban Garden, MD;  Location: [facility];  Service: Gastroenterology;  Laterality: N/A;  730   DILATION AND CURETTAGE OF UTERUS  1997   wisdom teeth extracted       Family History  Problem Relation Age of Onset   Stroke Mother    Coronary artery disease Father    Hypertension Father    Hemochromatosis Father    Cancer Father        prostate   Heart attack Father    Breast cancer Maternal Grandmother        75s   Osteoporosis Maternal Grandmother    Alzheimer's disease Maternal Grandmother    Coronary artery disease  Paternal Grandmother    Diabetes Paternal Grandmother    Hypertension Paternal Grandmother      Social History   Socioeconomic History   Marital status: Married    Spouse name: Not on file   Number of children: Not on file   Years of education: Not on file   Highest education level: Not on file  Occupational History   Occupation: RN    Employer: [employer]    Comment: Judy Walter and [place] [employer]  Tobacco Use   Smoking status: Never   Smokeless tobacco: Never  Vaping Use   Vaping status: Never Used  Substance and Sexual Activity   Alcohol use: Yes    Alcohol/week: 0.0 standard drinks of alcohol    Comment: rare wine   Drug use: No   Sexual activity: Yes    Partners: Male    Birth control/protection: Post-menopausal  Other Topics Concern   Not on file  Social History Narrative   Full time...RN.Aaron Aas  Regularly exercises- walks.    Social Drivers of Corporate investment banker Strain: Not on file  Food Insecurity: Not on file  Transportation Needs: Not on file  Physical Activity: Not on file  Stress: Not on file  Social Connections: Not on file  Intimate Partner Violence: Not on file     BP 136/82   Pulse 76   Ht 5\' 4"  (1.626 m)   Wt 131 lb (59.4 kg)   LMP 12/13/2017   SpO2 98%   BMI 22.49 kg/m   Physical Exam:  Well appearing NAD HEENT: Unremarkable Neck:  No JVD, no thyromegally Lymphatics:  No adenopathy Back:  No CVA tenderness Lungs:  Clear with no wheezes HEART:  Regular rate rhythm, no murmurs, no rubs, no clicks Abd:  soft, positive bowel sounds, no organomegally, no rebound, no guarding Ext:  2 plus pulses, no edema, no cyanosis, no clubbing Skin:  No rashes no nodules Neuro:  CN II through XII intact, motor grossly intact  DEVICE  Normal device function.  See PaceArt for details.   Assess/Plan:  Cryptogenic stroke - she has not had any atrial fib. She will continue low dose asa SVT - She has been stable on low dose toprol . Continue to monitor.  Vagally mediated syncope - we discussed the pathophys as well as avoidance and she will undergo watchful waiting.  Pete Brand [place] Weekly,MD

## 2024-01-08 NOTE — Patient Instructions (Signed)
 Medication Instructions:  Your physician recommends that you continue on your current medications as directed. Please refer to the Current Medication list given to you today.  *If you need a refill on your cardiac medications before your next appointment, please call your pharmacy*  Lab Work: None ordered.  You may go to any [facility] Location for your lab work:  [facility] - [address] ([facility]) - [address] [phone] [address]  [place] - [address]   [place]  - [address]   [place] - [address] - [address] [place]  - [address] - [address] ([facility]   If you have labs (blood work) drawn today and your tests are completely normal, you will receive your results only by: [employer] (if you have MyChart)  If you have any lab test that is abnormal or we need to change your treatment, we will call you or send a MyChart message to review the results.  Testing/Procedures: None ordered.  Follow-Up: At [facility], you and your health needs are our priority.  As part of our continuing mission to provide you with exceptional heart care, we have created designated Provider Care Teams.  These Care Teams include your primary Cardiologist (physician) and Advanced Practice Providers (APPs -  Physician Assistants and Nurse Practitioners) who all work together to provide you with the care you need, when you need it.  We recommend signing up for the patient portal called "MyChart".  Sign up information is provided on this After Visit Summary.  MyChart is used to connect with patients for Virtual Visits (Telemedicine).  Patients are able to view lab/test results, encounter notes, upcoming appointments, etc.  Non-urgent messages can be sent to your provider as well.   To learn more about what you can do with MyChart, go to  [email or web address].    Your next appointment:   1 year(s)  The format for your next appointment:   In Person  Provider:   Dr Sena Dam one of the following Advanced Practice Providers on your designated Care Team:   Mertha Abrahams, PA-C Bambi Lever "Jonelle Neri" [place], [place] Neda Balk, NP  Note: Remote monitoring is used to monitor your Pacemaker/ ICD from home. This monitoring reduces the number of office visits required to check your device to one time per year. It allows us  to keep an eye on the functioning of your device to ensure it is working properly.

## 2024-01-09 ENCOUNTER — Other Ambulatory Visit: Payer: Self-pay

## 2024-01-09 NOTE — Telephone Encounter (Signed)
 Completed [employer] disability form has been scanned into patient's chart and faxed to insurance.  Billing notified.

## 2024-01-15 DIAGNOSIS — F411 Generalized anxiety disorder: Secondary | ICD-10-CM | POA: Diagnosis not present

## 2024-01-18 NOTE — Telephone Encounter (Signed)
 Patient stated her FMLA paperwork needs to be faxed to [employer], fax# [phone].

## 2024-01-21 NOTE — Telephone Encounter (Signed)
 Please advise if this paperwork can be sent to [employer]. She states it was sent to [facility] but it needs to be sent to [employer] as well. Please advise.   fax# [phone].

## 2024-01-22 DIAGNOSIS — F411 Generalized anxiety disorder: Secondary | ICD-10-CM | POA: Diagnosis not present

## 2024-01-28 ENCOUNTER — Telehealth: Payer: Self-pay | Admitting: Internal Medicine

## 2024-01-28 NOTE — Telephone Encounter (Signed)
 The form that was sent to [employer] was a disability form from [employer].  Now, she needs her FMLA form from [employer]; which I have not received.  I will call patient to find out if [employer] will be sending us  the form.

## 2024-01-28 NOTE — Telephone Encounter (Signed)
 Patient is following-up on FMLA paperwork.  Patient stated the documentation which was sent to [facility] will need to be sent to [facility].

## 2024-01-28 NOTE — Telephone Encounter (Signed)
 Will forward this call to our front office Representative for further management and assistance with resending FMLA forms.

## 2024-01-29 DIAGNOSIS — F411 Generalized anxiety disorder: Secondary | ICD-10-CM | POA: Diagnosis not present

## 2024-01-29 DIAGNOSIS — Z0279 Encounter for issue of other medical certificate: Secondary | ICD-10-CM

## 2024-01-29 NOTE — Telephone Encounter (Signed)
 Patient brought in her [employer] form today.  She signed the release of information and paid the $29 form fee. Form is in Dr. Meridith Stanford box.

## 2024-02-01 NOTE — Telephone Encounter (Signed)
 FMLA form was faxed to [employer] and scanned to chart. Billing notified.

## 2024-02-01 NOTE — Telephone Encounter (Signed)
 Paperwork completed and given back to Larrie.

## 2024-02-06 DIAGNOSIS — F411 Generalized anxiety disorder: Secondary | ICD-10-CM | POA: Diagnosis not present

## 2024-02-11 DIAGNOSIS — S92214A Nondisplaced fracture of cuboid bone of right foot, initial encounter for closed fracture: Secondary | ICD-10-CM | POA: Diagnosis not present

## 2024-02-11 DIAGNOSIS — S92001A Unspecified fracture of right calcaneus, initial encounter for closed fracture: Secondary | ICD-10-CM | POA: Diagnosis not present

## 2024-02-12 ENCOUNTER — Telehealth: Payer: Self-pay

## 2024-02-12 ENCOUNTER — Ambulatory Visit: Payer: Commercial Managed Care - PPO | Admitting: Obstetrics and Gynecology

## 2024-02-12 NOTE — Telephone Encounter (Signed)
 Pt Judy Walter in triage line stating that she needs some assistance with weaning off of Effexor  rx.  Please advise.

## 2024-02-13 DIAGNOSIS — F411 Generalized anxiety disorder: Secondary | ICD-10-CM | POA: Diagnosis not present

## 2024-02-13 NOTE — Telephone Encounter (Signed)
 Pt notified and voiced understanding/appreciation. Encounter closed.

## 2024-02-13 NOTE — Telephone Encounter (Signed)
 She can try taking the Effexor  XR 37.5 mg every other day for a month and then stop.   If she is not tolerating this regimen, I can switch her to Effexor  (immediate release) 37.5 mg daily for one month and then stop.

## 2024-02-21 DIAGNOSIS — F411 Generalized anxiety disorder: Secondary | ICD-10-CM | POA: Diagnosis not present

## 2024-02-28 DIAGNOSIS — F411 Generalized anxiety disorder: Secondary | ICD-10-CM | POA: Diagnosis not present

## 2024-03-20 DIAGNOSIS — F411 Generalized anxiety disorder: Secondary | ICD-10-CM | POA: Diagnosis not present

## 2024-03-25 DIAGNOSIS — F411 Generalized anxiety disorder: Secondary | ICD-10-CM | POA: Diagnosis not present

## 2024-04-02 DIAGNOSIS — F411 Generalized anxiety disorder: Secondary | ICD-10-CM | POA: Diagnosis not present

## 2024-04-09 DIAGNOSIS — F411 Generalized anxiety disorder: Secondary | ICD-10-CM | POA: Diagnosis not present

## 2024-04-16 DIAGNOSIS — F411 Generalized anxiety disorder: Secondary | ICD-10-CM | POA: Diagnosis not present

## 2024-04-24 DIAGNOSIS — F411 Generalized anxiety disorder: Secondary | ICD-10-CM | POA: Diagnosis not present

## 2024-04-29 DIAGNOSIS — R252 Cramp and spasm: Secondary | ICD-10-CM | POA: Diagnosis not present

## 2024-04-29 DIAGNOSIS — Z0000 Encounter for general adult medical examination without abnormal findings: Secondary | ICD-10-CM | POA: Diagnosis not present

## 2024-04-29 DIAGNOSIS — E785 Hyperlipidemia, unspecified: Secondary | ICD-10-CM | POA: Diagnosis not present

## 2024-04-29 DIAGNOSIS — E559 Vitamin D deficiency, unspecified: Secondary | ICD-10-CM | POA: Diagnosis not present

## 2024-04-29 DIAGNOSIS — Z1331 Encounter for screening for depression: Secondary | ICD-10-CM | POA: Diagnosis not present

## 2024-04-29 DIAGNOSIS — Z299 Encounter for prophylactic measures, unspecified: Secondary | ICD-10-CM | POA: Diagnosis not present

## 2024-04-29 DIAGNOSIS — R5383 Other fatigue: Secondary | ICD-10-CM | POA: Diagnosis not present

## 2024-04-29 DIAGNOSIS — Z79899 Other long term (current) drug therapy: Secondary | ICD-10-CM | POA: Diagnosis not present

## 2024-05-02 DIAGNOSIS — F411 Generalized anxiety disorder: Secondary | ICD-10-CM | POA: Diagnosis not present

## 2024-05-06 DIAGNOSIS — F411 Generalized anxiety disorder: Secondary | ICD-10-CM | POA: Diagnosis not present

## 2024-05-15 DIAGNOSIS — F411 Generalized anxiety disorder: Secondary | ICD-10-CM | POA: Diagnosis not present

## 2024-05-19 DIAGNOSIS — E2839 Other primary ovarian failure: Secondary | ICD-10-CM | POA: Diagnosis not present

## 2024-05-21 DIAGNOSIS — M79662 Pain in left lower leg: Secondary | ICD-10-CM | POA: Diagnosis not present

## 2024-05-29 DIAGNOSIS — F411 Generalized anxiety disorder: Secondary | ICD-10-CM | POA: Diagnosis not present

## 2024-06-05 DIAGNOSIS — F411 Generalized anxiety disorder: Secondary | ICD-10-CM | POA: Diagnosis not present

## 2024-06-12 DIAGNOSIS — F411 Generalized anxiety disorder: Secondary | ICD-10-CM | POA: Diagnosis not present

## 2024-06-18 DIAGNOSIS — F411 Generalized anxiety disorder: Secondary | ICD-10-CM | POA: Diagnosis not present

## 2024-07-04 DIAGNOSIS — R070 Pain in throat: Secondary | ICD-10-CM | POA: Diagnosis not present

## 2024-07-04 DIAGNOSIS — J329 Chronic sinusitis, unspecified: Secondary | ICD-10-CM | POA: Diagnosis not present

## 2024-07-04 DIAGNOSIS — Z299 Encounter for prophylactic measures, unspecified: Secondary | ICD-10-CM | POA: Diagnosis not present

## 2024-07-17 DIAGNOSIS — F411 Generalized anxiety disorder: Secondary | ICD-10-CM | POA: Diagnosis not present

## 2024-07-22 ENCOUNTER — Encounter: Payer: Self-pay | Admitting: Obstetrics and Gynecology

## 2024-07-25 DIAGNOSIS — F411 Generalized anxiety disorder: Secondary | ICD-10-CM | POA: Diagnosis not present

## 2024-08-01 DIAGNOSIS — F411 Generalized anxiety disorder: Secondary | ICD-10-CM | POA: Diagnosis not present

## 2024-08-08 DIAGNOSIS — F411 Generalized anxiety disorder: Secondary | ICD-10-CM | POA: Diagnosis not present

## 2024-08-19 DIAGNOSIS — F411 Generalized anxiety disorder: Secondary | ICD-10-CM | POA: Diagnosis not present

## 2024-10-03 ENCOUNTER — Other Ambulatory Visit: Payer: Self-pay | Admitting: Obstetrics and Gynecology

## 2024-10-03 DIAGNOSIS — Z1231 Encounter for screening mammogram for malignant neoplasm of breast: Secondary | ICD-10-CM

## 2024-10-13 ENCOUNTER — Ambulatory Visit: Admitting: Obstetrics and Gynecology

## 2024-10-17 ENCOUNTER — Other Ambulatory Visit (HOSPITAL_COMMUNITY): Payer: Self-pay

## 2024-10-17 MED ORDER — PROGESTERONE MICRONIZED 100 MG PO CAPS
100.0000 mg | ORAL_CAPSULE | Freq: Every day | ORAL | 2 refills | Status: AC
  Filled 2024-10-17: qty 90, 90d supply, fill #0

## 2024-10-17 MED ORDER — ESTRADIOL 0.025 MG/24HR TD PTTW
1.0000 | MEDICATED_PATCH | TRANSDERMAL | 2 refills | Status: AC
  Filled 2024-10-17: qty 24, 84d supply, fill #0

## 2024-10-23 ENCOUNTER — Ambulatory Visit

## 2024-10-24 ENCOUNTER — Ambulatory Visit

## 2024-11-03 ENCOUNTER — Ambulatory Visit: Payer: Commercial Managed Care - PPO | Admitting: Obstetrics and Gynecology

## 2024-12-02 ENCOUNTER — Ambulatory Visit: Admitting: Obstetrics and Gynecology
